# Patient Record
Sex: Female | Born: 1940
Health system: Southern US, Community
[De-identification: ages and names within clinical notes are randomized; demographics above are authoritative.]

## PROBLEM LIST (undated history)

## (undated) DIAGNOSIS — D649 Anemia, unspecified: Secondary | ICD-10-CM

## (undated) DIAGNOSIS — F32A Depression, unspecified: Secondary | ICD-10-CM

## (undated) DIAGNOSIS — E785 Hyperlipidemia, unspecified: Secondary | ICD-10-CM

## (undated) DIAGNOSIS — F419 Anxiety disorder, unspecified: Secondary | ICD-10-CM

## (undated) DIAGNOSIS — I10 Essential (primary) hypertension: Secondary | ICD-10-CM

## (undated) DIAGNOSIS — K219 Gastro-esophageal reflux disease without esophagitis: Secondary | ICD-10-CM

## (undated) DIAGNOSIS — C4492 Squamous cell carcinoma of skin, unspecified: Secondary | ICD-10-CM

## (undated) DIAGNOSIS — I480 Paroxysmal atrial fibrillation: Secondary | ICD-10-CM

## (undated) DIAGNOSIS — C4491 Basal cell carcinoma of skin, unspecified: Secondary | ICD-10-CM

## (undated) DIAGNOSIS — I739 Peripheral vascular disease, unspecified: Secondary | ICD-10-CM

## (undated) DIAGNOSIS — M1990 Unspecified osteoarthritis, unspecified site: Secondary | ICD-10-CM

## (undated) DIAGNOSIS — J189 Pneumonia, unspecified organism: Secondary | ICD-10-CM

## (undated) DIAGNOSIS — G4700 Insomnia, unspecified: Secondary | ICD-10-CM

## (undated) HISTORY — DX: Hyperlipidemia, unspecified: E78.5

## (undated) HISTORY — PX: COLONOSCOPY: SHX174

## (undated) HISTORY — PX: THORACIC SPINE SURGERY: SHX802

## (undated) HISTORY — DX: Paroxysmal atrial fibrillation: I48.0

## (undated) HISTORY — PX: TUBAL LIGATION: SHX77

## (undated) HISTORY — DX: Insomnia, unspecified: G47.00

## (undated) HISTORY — DX: Essential (primary) hypertension: I10

---
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
## 1997-08-28 HISTORY — PX: THORACIC SPINE SURGERY: SHX802

## 1998-03-24 ENCOUNTER — Other Ambulatory Visit: Admission: RE | Admit: 1998-03-24 | Discharge: 1998-03-24 | Payer: Self-pay | Admitting: *Deleted

## 1999-03-11 ENCOUNTER — Other Ambulatory Visit: Admission: RE | Admit: 1999-03-11 | Discharge: 1999-03-11 | Payer: Self-pay | Admitting: *Deleted

## 1999-10-26 ENCOUNTER — Encounter: Payer: Self-pay | Admitting: Specialist

## 1999-10-26 ENCOUNTER — Ambulatory Visit (HOSPITAL_COMMUNITY): Admission: RE | Admit: 1999-10-26 | Discharge: 1999-10-27 | Payer: Self-pay | Admitting: Specialist

## 1999-10-26 ENCOUNTER — Encounter (INDEPENDENT_AMBULATORY_CARE_PROVIDER_SITE_OTHER): Payer: Self-pay | Admitting: Specialist

## 2000-01-30 ENCOUNTER — Other Ambulatory Visit: Admission: RE | Admit: 2000-01-30 | Discharge: 2000-01-30 | Payer: Self-pay | Admitting: *Deleted

## 2000-06-27 ENCOUNTER — Other Ambulatory Visit: Admission: RE | Admit: 2000-06-27 | Discharge: 2000-06-27 | Payer: Self-pay | Admitting: *Deleted

## 2001-02-19 DIAGNOSIS — C4492 Squamous cell carcinoma of skin, unspecified: Secondary | ICD-10-CM

## 2001-02-19 HISTORY — DX: Squamous cell carcinoma of skin, unspecified: C44.92

## 2001-03-18 ENCOUNTER — Other Ambulatory Visit: Admission: RE | Admit: 2001-03-18 | Discharge: 2001-03-18 | Payer: Self-pay | Admitting: *Deleted

## 2001-03-18 ENCOUNTER — Encounter (INDEPENDENT_AMBULATORY_CARE_PROVIDER_SITE_OTHER): Payer: Self-pay | Admitting: Specialist

## 2002-05-21 ENCOUNTER — Other Ambulatory Visit: Admission: RE | Admit: 2002-05-21 | Discharge: 2002-05-21 | Payer: Self-pay | Admitting: *Deleted

## 2002-11-30 ENCOUNTER — Encounter: Payer: Self-pay | Admitting: Endocrinology

## 2002-11-30 ENCOUNTER — Ambulatory Visit: Admission: RE | Admit: 2002-11-30 | Discharge: 2002-11-30 | Payer: Self-pay | Admitting: Endocrinology

## 2003-07-08 ENCOUNTER — Other Ambulatory Visit: Admission: RE | Admit: 2003-07-08 | Discharge: 2003-07-08 | Payer: Self-pay | Admitting: *Deleted

## 2004-07-13 ENCOUNTER — Other Ambulatory Visit: Admission: RE | Admit: 2004-07-13 | Discharge: 2004-07-13 | Payer: Self-pay | Admitting: *Deleted

## 2005-04-18 DIAGNOSIS — C4491 Basal cell carcinoma of skin, unspecified: Secondary | ICD-10-CM

## 2005-04-18 HISTORY — DX: Basal cell carcinoma of skin, unspecified: C44.91

## 2005-07-14 ENCOUNTER — Other Ambulatory Visit: Admission: RE | Admit: 2005-07-14 | Discharge: 2005-07-14 | Payer: Self-pay | Admitting: Obstetrics and Gynecology

## 2006-09-04 ENCOUNTER — Other Ambulatory Visit: Admission: RE | Admit: 2006-09-04 | Discharge: 2006-09-04 | Payer: Self-pay | Admitting: Obstetrics & Gynecology

## 2009-06-09 ENCOUNTER — Encounter: Payer: Self-pay | Admitting: Obstetrics and Gynecology

## 2009-06-09 ENCOUNTER — Ambulatory Visit: Payer: Self-pay | Admitting: Obstetrics and Gynecology

## 2011-01-13 NOTE — Op Note (Signed)
[facility]  Patient:    Jodi Cline, Jodi Cline                            MRN: [id number] Proc. Date: 10/26/99 Attending:  Javier Docker, M.D.                           Operative Report  PREOPERATIVE DIAGNOSIS:  Herniated nucleus pulposus, L4-5 left.  POSTOPERATIVE DIAGNOSIS:  Herniated nucleus pulposus, L4-5 left.  OPERATION PERFORMED:  Microdiskectomy, L4-5 left.  SURGEON:  Javier Docker, M.D.  ASSISTANT:  Kerrin Champagne, M.D.  ANESTHESIA:  General.  INDICATIONS FOR PROCEDURE:  The patient is a 70 year old female with refractory  left lower extremity radicular pain, weakness, numbness secondary to an extruded fragment in L4-5, migrating cephalad compressing the 4 root.  Operative intervention was indicated for decompression of the 4 root.  Risks and benefits  were discussed including bleeding, infection, injury to neurovascular structures, no change in symptoms, need for fusion in the future, recurrent herniation, etc.  DESCRIPTION OF PROCEDURE:  With the patient in supine position after induction f adequate general anesthesia, 1 g of Kefzol IV ____________ prophylaxis, the patient was placed prone on the [place] frame.  All bony prominences were well padded.  The lumbar region was prepped and draped in the usual sterile fashion.  Three 18-gauge spinal needles were utilized to localize the 4-5 disk space. This was confirmed with x-ray and incision was made from the spinous process 4 to 5.  Subcutaneous tissue was dissected.  Electrocautery was utilized to achieve hemostasis.  The dorsal lumbar fascia was identified and divided in line with the skin incision and paraspinous muscles elevated from the lamina of 4 and 5. McCullough retractor was placed.  Operating microscope was draped and brought into the surgical field.  High-speed bur was utilized to perform a hemilaminotomy. he wound was irrigated.  A 2 mm Kerrison was utilized to detach  the ligamentum flavum from the cephalad attachment at 4.  A neural patty was placed beneath the ligamentum flavum.  This was then removed.  The L4 and L5 nerve roots were identified and found to be compressed in the lateral recess.  The lateral recess was decompressed with a 2 mm Kerrison.  With the neural elements protected at all times, a neural probe was then utilized to probe for the migrated herniation and this was noted to be migrated cephalad compressing the 4 root.  This was mobilized and retrieved with a micropituitary decompressing the 4 root.  The 4 root was found to be erythematous and edematous but intact.  The disk space was then identified and there was herniated material noted here as well.  With the neural elements ell protected, the annulotomy was performed with a 15 blade and copious portion of isk material was removed from the disk space.  Next, the foramina of 4 and 5 were probed and found to be without evidence of neural compression.  Beneath the thecal sac, the axilla and shoulder of the root were examined with no elements of residual disk material.  The wound was copiously irrigated and the disk space was copiously irrigated.  Confirmatory radiograph obtained.  This was copiously again, evaluated and inspection revealed no evidence of active bleeding or CSF leakage.  The McCullough retractor was then removed.  Paraspinous muscles inspected with no evidence of active bleeding.  Dorsal lumbar fascia reapproximated  with #1 Vicryl interrupted figure-of-eight sutures.  Subcutaneous tissues were reapproximated ith 2-0 Vicryl sutures.  Skin was reapproximated with 4-0 ____________ subcuticular  PDS.  The wound was reinforced with Steri-Strips and sterile dressing applied. The patient was supine on the hospital bed, extubated without difficulty and transported to recovery in satisfactory condition.  The patient tolerated the procedure well without  complication. DD:  12/26/99 TD:  12/28/99 Job: [id number]

## 2011-01-31 ENCOUNTER — Other Ambulatory Visit: Payer: Self-pay | Admitting: Physician Assistant

## 2011-04-18 ENCOUNTER — Other Ambulatory Visit: Payer: Self-pay | Admitting: Physician Assistant

## 2012-06-18 ENCOUNTER — Other Ambulatory Visit: Payer: Self-pay | Admitting: Internal Medicine

## 2012-06-18 DIAGNOSIS — R42 Dizziness and giddiness: Secondary | ICD-10-CM

## 2012-06-22 ENCOUNTER — Ambulatory Visit
Admission: RE | Admit: 2012-06-22 | Discharge: 2012-06-22 | Disposition: A | Discharge status: Home / Self Care | Payer: Self-pay | Source: Ambulatory Visit | Attending: Internal Medicine | Admitting: Internal Medicine

## 2012-06-22 ENCOUNTER — Ambulatory Visit
Admission: RE | Admit: 2012-06-22 | Discharge: 2012-06-22 | Disposition: A | Discharge status: Home / Self Care | Payer: Medicare Other | Source: Ambulatory Visit | Attending: Internal Medicine | Admitting: Internal Medicine

## 2012-06-22 DIAGNOSIS — R42 Dizziness and giddiness: Secondary | ICD-10-CM

## 2013-04-23 ENCOUNTER — Other Ambulatory Visit: Payer: Self-pay | Admitting: Physician Assistant

## 2013-08-05 ENCOUNTER — Other Ambulatory Visit: Payer: Self-pay | Admitting: Physician Assistant

## 2013-08-05 DIAGNOSIS — C4491 Basal cell carcinoma of skin, unspecified: Secondary | ICD-10-CM

## 2013-08-05 HISTORY — DX: Basal cell carcinoma of skin, unspecified: C44.91

## 2013-09-18 ENCOUNTER — Other Ambulatory Visit: Payer: Self-pay | Admitting: Physician Assistant

## 2014-03-26 ENCOUNTER — Encounter (HOSPITAL_BASED_OUTPATIENT_CLINIC_OR_DEPARTMENT_OTHER): Payer: Medicare HMO | Attending: Internal Medicine

## 2014-03-26 ENCOUNTER — Encounter (HOSPITAL_BASED_OUTPATIENT_CLINIC_OR_DEPARTMENT_OTHER): Payer: Medicare Other

## 2014-03-26 DIAGNOSIS — S81809A Unspecified open wound, unspecified lower leg, initial encounter: Secondary | ICD-10-CM | POA: Diagnosis present

## 2014-03-26 DIAGNOSIS — X58XXXA Exposure to other specified factors, initial encounter: Secondary | ICD-10-CM | POA: Insufficient documentation

## 2014-03-26 NOTE — Progress Notes (Signed)
[facility]  NAMEASAL, TEAS NO.:  [id number]  MEDICAL RECORD NO.:  [id number]      DATE OF BIRTH:  Jul 19, 1941  PHYSICIAN:  Ricard Dillon, M.D.      VISIT DATE:                                  OFFICE VISIT   CHIEF COMPLAINT:  Review of wounds on her anterior bilateral lower legs.  Jodi Cline comes to [place] with a courtesy of Dr. Lavone Orn, at [facility].  I do not have too much more to add to the history here.  The patient was working in her yard on her roses, had a fall traumatizing her anterior lower legs on a planter.  There was skin tears.  She has seems to have used various topical issues here such as a warm salt solution for a period of time.  In any case, she was prescribed Keflex for infected wound.  She had an allergic reaction to the [facility], received Benadryl and was put on doxycycline.  She came to the attention of Dr. Laurann Montana who has sent her here.  PAST MEDICAL HISTORY:  Limited.  She only as hypertension, hyperlipidemia.  PAST SURGICAL HISTORY:  Tubal ligation, laparoscopic back surgery, and oral surgery.  CURRENT MEDICATIONS:  Include doxycycline 100 b.i.d., biotin 100 daily, multivitamin, hydrochlorothiazide 25 daily, benazepril 40 daily, atorvastatin 10 daily, loratadine 10 daily, and Ambien 10 at bedtime.  PHYSICAL EXAMINATION:  VITAL SIGNS:  On examination, temperature is 98.4, pulse 76, respirations 18, blood pressure 154/59.  VASCULAR ASSESSMENT:  We did not do ABIs due to pain issue, however her dorsalis pedis pulses easily palpable bilaterally, popliteal pulses are strong.  She does not have significant venous stasis.  WOUND EXAM:  Unfortunately, the patient has a fair amount of damage here.  On the right anterior lower extremity, there are 2 separate wounds, all of these in the same state with a blackened eschar covering the wound area.  Also on the right lower extremity, there is a  small hematoma.  On the left extremity anterior, there are also 2 wounds, one of them small and a superior linear wound again with the same eschar.  We attempted to anesthetize this with topical lidocaine only with marginal effect.  I did have fair amount of debridement on these, the patient tolerated this marginally.  With removal of the eschar, there is healthy-looking tissue underneath.  I think once this is properly debrided healing should proceed nicely.  IMPRESSION:  Traumatic wound with a blackened eschar covering most of the surface of these wounds.  We applied Santyl foam dressing and a Kerlix wrap.  We prescribed the Santyl and we will get the foam and Kerlix sent to the patient's home through [facility].  The reason that this blackened eschar has formed so quickly over the wound surface in 2 weeks is a bit unusual, I do not have a wonderful explanation for this.  There was no obvious infection surrounding this debridement.  I did not culture and did not alter the doxycycline, she has already been prescribed.  It does not look infected currently.  The patient can change the dressings 2 or 3 times a week.  We will see her back in the [place]  in a week's time.          ______________________________ Ricard Dillon, M.D.     MGR/MEDQ  D:  03/26/2014  T:  03/26/2014  Job:  [id number]

## 2014-03-31 ENCOUNTER — Encounter (HOSPITAL_BASED_OUTPATIENT_CLINIC_OR_DEPARTMENT_OTHER): Payer: Medicare HMO | Attending: General Surgery

## 2014-03-31 ENCOUNTER — Other Ambulatory Visit: Payer: Self-pay | Admitting: Physician Assistant

## 2014-03-31 DIAGNOSIS — X58XXXA Exposure to other specified factors, initial encounter: Secondary | ICD-10-CM | POA: Diagnosis not present

## 2014-03-31 DIAGNOSIS — S81809A Unspecified open wound, unspecified lower leg, initial encounter: Secondary | ICD-10-CM | POA: Diagnosis present

## 2014-04-01 ENCOUNTER — Encounter (HOSPITAL_BASED_OUTPATIENT_CLINIC_OR_DEPARTMENT_OTHER): Payer: Medicare Other

## 2014-04-07 DIAGNOSIS — S81809A Unspecified open wound, unspecified lower leg, initial encounter: Secondary | ICD-10-CM | POA: Diagnosis not present

## 2014-04-21 DIAGNOSIS — S81809A Unspecified open wound, unspecified lower leg, initial encounter: Secondary | ICD-10-CM | POA: Diagnosis not present

## 2014-05-05 ENCOUNTER — Encounter (HOSPITAL_BASED_OUTPATIENT_CLINIC_OR_DEPARTMENT_OTHER): Payer: Medicare HMO | Attending: General Surgery

## 2014-05-05 DIAGNOSIS — X58XXXA Exposure to other specified factors, initial encounter: Secondary | ICD-10-CM | POA: Insufficient documentation

## 2014-05-05 DIAGNOSIS — S81809A Unspecified open wound, unspecified lower leg, initial encounter: Secondary | ICD-10-CM | POA: Insufficient documentation

## 2014-05-07 ENCOUNTER — Other Ambulatory Visit: Payer: Self-pay | Admitting: Physician Assistant

## 2014-08-04 ENCOUNTER — Other Ambulatory Visit: Payer: Self-pay | Admitting: Physician Assistant

## 2014-09-16 DIAGNOSIS — C44729 Squamous cell carcinoma of skin of left lower limb, including hip: Secondary | ICD-10-CM | POA: Diagnosis not present

## 2014-09-23 DIAGNOSIS — M542 Cervicalgia: Secondary | ICD-10-CM | POA: Diagnosis not present

## 2014-09-23 DIAGNOSIS — G4700 Insomnia, unspecified: Secondary | ICD-10-CM | POA: Diagnosis not present

## 2014-09-23 DIAGNOSIS — I10 Essential (primary) hypertension: Secondary | ICD-10-CM | POA: Diagnosis not present

## 2014-09-23 DIAGNOSIS — N3281 Overactive bladder: Secondary | ICD-10-CM | POA: Diagnosis not present

## 2014-10-20 DIAGNOSIS — L309 Dermatitis, unspecified: Secondary | ICD-10-CM | POA: Diagnosis not present

## 2014-10-20 DIAGNOSIS — L910 Hypertrophic scar: Secondary | ICD-10-CM | POA: Diagnosis not present

## 2015-02-18 DIAGNOSIS — Z1231 Encounter for screening mammogram for malignant neoplasm of breast: Secondary | ICD-10-CM | POA: Diagnosis not present

## 2015-04-07 DIAGNOSIS — M5412 Radiculopathy, cervical region: Secondary | ICD-10-CM | POA: Diagnosis not present

## 2015-04-07 DIAGNOSIS — E780 Pure hypercholesterolemia: Secondary | ICD-10-CM | POA: Diagnosis not present

## 2015-04-07 DIAGNOSIS — I10 Essential (primary) hypertension: Secondary | ICD-10-CM | POA: Diagnosis not present

## 2015-04-07 DIAGNOSIS — D692 Other nonthrombocytopenic purpura: Secondary | ICD-10-CM | POA: Diagnosis not present

## 2015-04-20 DIAGNOSIS — S80819A Abrasion, unspecified lower leg, initial encounter: Secondary | ICD-10-CM | POA: Diagnosis not present

## 2015-05-26 ENCOUNTER — Other Ambulatory Visit: Payer: Self-pay | Admitting: Physician Assistant

## 2015-05-26 DIAGNOSIS — L570 Actinic keratosis: Secondary | ICD-10-CM | POA: Diagnosis not present

## 2015-05-26 DIAGNOSIS — H5203 Hypermetropia, bilateral: Secondary | ICD-10-CM | POA: Diagnosis not present

## 2015-05-26 DIAGNOSIS — Z961 Presence of intraocular lens: Secondary | ICD-10-CM | POA: Diagnosis not present

## 2015-05-26 DIAGNOSIS — D485 Neoplasm of uncertain behavior of skin: Secondary | ICD-10-CM | POA: Diagnosis not present

## 2015-06-16 DIAGNOSIS — L570 Actinic keratosis: Secondary | ICD-10-CM | POA: Diagnosis not present

## 2015-07-13 ENCOUNTER — Other Ambulatory Visit: Payer: Self-pay | Admitting: Physician Assistant

## 2015-07-13 DIAGNOSIS — L859 Epidermal thickening, unspecified: Secondary | ICD-10-CM | POA: Diagnosis not present

## 2015-07-13 DIAGNOSIS — D485 Neoplasm of uncertain behavior of skin: Secondary | ICD-10-CM | POA: Diagnosis not present

## 2015-07-13 DIAGNOSIS — C44712 Basal cell carcinoma of skin of right lower limb, including hip: Secondary | ICD-10-CM | POA: Diagnosis not present

## 2015-07-13 DIAGNOSIS — C44719 Basal cell carcinoma of skin of left lower limb, including hip: Secondary | ICD-10-CM | POA: Diagnosis not present

## 2015-07-13 DIAGNOSIS — C4491 Basal cell carcinoma of skin, unspecified: Secondary | ICD-10-CM

## 2015-07-13 HISTORY — DX: Basal cell carcinoma of skin, unspecified: C44.91

## 2015-09-14 DIAGNOSIS — L910 Hypertrophic scar: Secondary | ICD-10-CM | POA: Diagnosis not present

## 2015-09-14 DIAGNOSIS — L570 Actinic keratosis: Secondary | ICD-10-CM | POA: Diagnosis not present

## 2015-10-11 DIAGNOSIS — I10 Essential (primary) hypertension: Secondary | ICD-10-CM | POA: Diagnosis not present

## 2015-10-11 DIAGNOSIS — N3281 Overactive bladder: Secondary | ICD-10-CM | POA: Diagnosis not present

## 2015-10-11 DIAGNOSIS — M5412 Radiculopathy, cervical region: Secondary | ICD-10-CM | POA: Diagnosis not present

## 2015-10-27 DIAGNOSIS — M79662 Pain in left lower leg: Secondary | ICD-10-CM | POA: Diagnosis not present

## 2015-10-27 DIAGNOSIS — K529 Noninfective gastroenteritis and colitis, unspecified: Secondary | ICD-10-CM | POA: Diagnosis not present

## 2015-11-09 DIAGNOSIS — L309 Dermatitis, unspecified: Secondary | ICD-10-CM | POA: Diagnosis not present

## 2015-12-13 DIAGNOSIS — R300 Dysuria: Secondary | ICD-10-CM | POA: Diagnosis not present

## 2016-01-26 ENCOUNTER — Other Ambulatory Visit: Payer: Self-pay | Admitting: Family Medicine

## 2016-01-26 DIAGNOSIS — M5412 Radiculopathy, cervical region: Secondary | ICD-10-CM

## 2016-01-28 ENCOUNTER — Other Ambulatory Visit: Payer: Self-pay | Admitting: Internal Medicine

## 2016-01-28 DIAGNOSIS — M5412 Radiculopathy, cervical region: Secondary | ICD-10-CM

## 2016-01-29 ENCOUNTER — Ambulatory Visit
Admission: RE | Admit: 2016-01-29 | Discharge: 2016-01-29 | Disposition: A | Discharge status: Home / Self Care | Payer: Commercial Managed Care - HMO | Source: Ambulatory Visit | Attending: Family Medicine | Admitting: Family Medicine

## 2016-01-29 DIAGNOSIS — M50322 Other cervical disc degeneration at C5-C6 level: Secondary | ICD-10-CM | POA: Diagnosis not present

## 2016-01-29 DIAGNOSIS — M5412 Radiculopathy, cervical region: Secondary | ICD-10-CM

## 2016-01-29 DIAGNOSIS — M50323 Other cervical disc degeneration at C6-C7 level: Secondary | ICD-10-CM | POA: Diagnosis not present

## 2016-02-23 DIAGNOSIS — Z803 Family history of malignant neoplasm of breast: Secondary | ICD-10-CM | POA: Diagnosis not present

## 2016-02-23 DIAGNOSIS — Z1231 Encounter for screening mammogram for malignant neoplasm of breast: Secondary | ICD-10-CM | POA: Diagnosis not present

## 2016-03-21 DIAGNOSIS — M5412 Radiculopathy, cervical region: Secondary | ICD-10-CM | POA: Diagnosis not present

## 2016-03-21 DIAGNOSIS — M5030 Other cervical disc degeneration, unspecified cervical region: Secondary | ICD-10-CM | POA: Diagnosis not present

## 2016-03-24 DIAGNOSIS — M5412 Radiculopathy, cervical region: Secondary | ICD-10-CM | POA: Diagnosis not present

## 2016-03-24 DIAGNOSIS — M5030 Other cervical disc degeneration, unspecified cervical region: Secondary | ICD-10-CM | POA: Diagnosis not present

## 2016-03-31 DIAGNOSIS — M5030 Other cervical disc degeneration, unspecified cervical region: Secondary | ICD-10-CM | POA: Diagnosis not present

## 2016-03-31 DIAGNOSIS — M5412 Radiculopathy, cervical region: Secondary | ICD-10-CM | POA: Diagnosis not present

## 2016-04-07 DIAGNOSIS — M5412 Radiculopathy, cervical region: Secondary | ICD-10-CM | POA: Diagnosis not present

## 2016-04-07 DIAGNOSIS — M5030 Other cervical disc degeneration, unspecified cervical region: Secondary | ICD-10-CM | POA: Diagnosis not present

## 2016-04-10 DIAGNOSIS — Z1389 Encounter for screening for other disorder: Secondary | ICD-10-CM | POA: Diagnosis not present

## 2016-04-10 DIAGNOSIS — Z6825 Body mass index (BMI) 25.0-25.9, adult: Secondary | ICD-10-CM | POA: Diagnosis not present

## 2016-04-10 DIAGNOSIS — Z0000 Encounter for general adult medical examination without abnormal findings: Secondary | ICD-10-CM | POA: Diagnosis not present

## 2016-04-10 DIAGNOSIS — I10 Essential (primary) hypertension: Secondary | ICD-10-CM | POA: Diagnosis not present

## 2016-04-10 DIAGNOSIS — M5030 Other cervical disc degeneration, unspecified cervical region: Secondary | ICD-10-CM | POA: Diagnosis not present

## 2016-04-10 DIAGNOSIS — E7800 Pure hypercholesterolemia, unspecified: Secondary | ICD-10-CM | POA: Diagnosis not present

## 2016-04-14 DIAGNOSIS — M5030 Other cervical disc degeneration, unspecified cervical region: Secondary | ICD-10-CM | POA: Diagnosis not present

## 2016-04-14 DIAGNOSIS — M5412 Radiculopathy, cervical region: Secondary | ICD-10-CM | POA: Diagnosis not present

## 2016-04-26 ENCOUNTER — Other Ambulatory Visit: Payer: Self-pay | Admitting: Physician Assistant

## 2016-04-26 DIAGNOSIS — L719 Rosacea, unspecified: Secondary | ICD-10-CM | POA: Diagnosis not present

## 2016-04-26 DIAGNOSIS — L570 Actinic keratosis: Secondary | ICD-10-CM | POA: Diagnosis not present

## 2016-04-26 DIAGNOSIS — D485 Neoplasm of uncertain behavior of skin: Secondary | ICD-10-CM | POA: Diagnosis not present

## 2016-05-25 DIAGNOSIS — L570 Actinic keratosis: Secondary | ICD-10-CM | POA: Diagnosis not present

## 2016-05-25 DIAGNOSIS — L309 Dermatitis, unspecified: Secondary | ICD-10-CM | POA: Diagnosis not present

## 2016-08-02 DIAGNOSIS — Z961 Presence of intraocular lens: Secondary | ICD-10-CM | POA: Diagnosis not present

## 2016-08-02 DIAGNOSIS — H02831 Dermatochalasis of right upper eyelid: Secondary | ICD-10-CM | POA: Diagnosis not present

## 2016-08-02 DIAGNOSIS — H5203 Hypermetropia, bilateral: Secondary | ICD-10-CM | POA: Diagnosis not present

## 2016-08-02 DIAGNOSIS — H02834 Dermatochalasis of left upper eyelid: Secondary | ICD-10-CM | POA: Diagnosis not present

## 2016-08-16 DIAGNOSIS — L570 Actinic keratosis: Secondary | ICD-10-CM | POA: Diagnosis not present

## 2016-08-16 DIAGNOSIS — L720 Epidermal cyst: Secondary | ICD-10-CM | POA: Diagnosis not present

## 2016-10-12 ENCOUNTER — Other Ambulatory Visit: Payer: Self-pay | Admitting: Physician Assistant

## 2016-10-12 DIAGNOSIS — L720 Epidermal cyst: Secondary | ICD-10-CM | POA: Diagnosis not present

## 2016-10-13 DIAGNOSIS — I10 Essential (primary) hypertension: Secondary | ICD-10-CM | POA: Diagnosis not present

## 2016-10-13 DIAGNOSIS — E7800 Pure hypercholesterolemia, unspecified: Secondary | ICD-10-CM | POA: Diagnosis not present

## 2016-10-25 DIAGNOSIS — T783XXA Angioneurotic edema, initial encounter: Secondary | ICD-10-CM | POA: Diagnosis not present

## 2016-10-25 DIAGNOSIS — T464X1A Poisoning by angiotensin-converting-enzyme inhibitors, accidental (unintentional), initial encounter: Secondary | ICD-10-CM | POA: Diagnosis not present

## 2016-11-13 DIAGNOSIS — I10 Essential (primary) hypertension: Secondary | ICD-10-CM | POA: Diagnosis not present

## 2016-12-28 DIAGNOSIS — I10 Essential (primary) hypertension: Secondary | ICD-10-CM | POA: Diagnosis not present

## 2017-03-23 DIAGNOSIS — Z1231 Encounter for screening mammogram for malignant neoplasm of breast: Secondary | ICD-10-CM | POA: Diagnosis not present

## 2017-03-23 DIAGNOSIS — Z803 Family history of malignant neoplasm of breast: Secondary | ICD-10-CM | POA: Diagnosis not present

## 2017-03-23 DIAGNOSIS — M8589 Other specified disorders of bone density and structure, multiple sites: Secondary | ICD-10-CM | POA: Diagnosis not present

## 2017-03-23 DIAGNOSIS — Z780 Asymptomatic menopausal state: Secondary | ICD-10-CM | POA: Diagnosis not present

## 2017-04-17 ENCOUNTER — Other Ambulatory Visit: Payer: Self-pay | Admitting: Internal Medicine

## 2017-04-17 ENCOUNTER — Ambulatory Visit
Admission: RE | Admit: 2017-04-17 | Discharge: 2017-04-17 | Disposition: A | Discharge status: Home / Self Care | Payer: Commercial Managed Care - HMO | Source: Ambulatory Visit | Attending: Internal Medicine | Admitting: Internal Medicine

## 2017-04-17 DIAGNOSIS — M79671 Pain in right foot: Secondary | ICD-10-CM

## 2017-04-17 DIAGNOSIS — Z0000 Encounter for general adult medical examination without abnormal findings: Secondary | ICD-10-CM | POA: Diagnosis not present

## 2017-04-17 DIAGNOSIS — Z1389 Encounter for screening for other disorder: Secondary | ICD-10-CM | POA: Diagnosis not present

## 2017-04-17 DIAGNOSIS — M25571 Pain in right ankle and joints of right foot: Secondary | ICD-10-CM | POA: Diagnosis not present

## 2017-04-17 DIAGNOSIS — M79672 Pain in left foot: Secondary | ICD-10-CM | POA: Diagnosis not present

## 2017-04-17 DIAGNOSIS — E7800 Pure hypercholesterolemia, unspecified: Secondary | ICD-10-CM | POA: Diagnosis not present

## 2017-04-17 DIAGNOSIS — E538 Deficiency of other specified B group vitamins: Secondary | ICD-10-CM | POA: Diagnosis not present

## 2017-04-17 DIAGNOSIS — M19071 Primary osteoarthritis, right ankle and foot: Secondary | ICD-10-CM | POA: Diagnosis not present

## 2017-04-17 DIAGNOSIS — I10 Essential (primary) hypertension: Secondary | ICD-10-CM | POA: Diagnosis not present

## 2017-04-17 DIAGNOSIS — F5104 Psychophysiologic insomnia: Secondary | ICD-10-CM | POA: Diagnosis not present

## 2017-06-20 DIAGNOSIS — L219 Seborrheic dermatitis, unspecified: Secondary | ICD-10-CM | POA: Diagnosis not present

## 2017-06-20 DIAGNOSIS — L299 Pruritus, unspecified: Secondary | ICD-10-CM | POA: Diagnosis not present

## 2017-06-20 DIAGNOSIS — Z23 Encounter for immunization: Secondary | ICD-10-CM | POA: Diagnosis not present

## 2017-06-20 DIAGNOSIS — D229 Melanocytic nevi, unspecified: Secondary | ICD-10-CM | POA: Diagnosis not present

## 2017-07-10 DIAGNOSIS — Z961 Presence of intraocular lens: Secondary | ICD-10-CM | POA: Diagnosis not present

## 2017-07-10 DIAGNOSIS — H5203 Hypermetropia, bilateral: Secondary | ICD-10-CM | POA: Diagnosis not present

## 2017-08-08 ENCOUNTER — Ambulatory Visit: Payer: Commercial Managed Care - HMO | Admitting: Sports Medicine

## 2017-08-08 ENCOUNTER — Encounter: Payer: Self-pay | Admitting: Sports Medicine

## 2017-08-08 ENCOUNTER — Ambulatory Visit: Payer: Self-pay

## 2017-08-08 VITALS — BP 110/70 | HR 57 | Ht 66.75 in | Wt 159.8 lb

## 2017-08-08 DIAGNOSIS — M79672 Pain in left foot: Secondary | ICD-10-CM | POA: Diagnosis not present

## 2017-08-08 DIAGNOSIS — M7752 Other enthesopathy of left foot: Secondary | ICD-10-CM | POA: Diagnosis not present

## 2017-08-08 DIAGNOSIS — M24573 Contracture, unspecified ankle: Secondary | ICD-10-CM

## 2017-08-08 DIAGNOSIS — M722 Plantar fascial fibromatosis: Secondary | ICD-10-CM

## 2017-08-08 MED ORDER — CELECOXIB 100 MG PO CAPS: ORAL_CAPSULE | ORAL | 2 refills | Status: DC

## 2017-08-08 NOTE — Progress Notes (Signed)
Jodi Cline. Jodi Cline, [place] at [place] - 76 y.o. female MRN [id number]  Date of birth: [birth year]   Scribe for today's visit: Josepha Pigg, CMA    SUBJECTIVE:  Jodi Cline is here for New Patient (Initial Visit) (LT foot pain)  Her LT foot/ankle/heel pain symptoms INITIALLY: Began about 1 year ago and she has hx of plantar fasciitis. This is a little different, feels like it is more in the ankle this time.  Described as a varying degree of pain depending on how she walks. Pain ranges from mild to severe at times.  Pain is described as sharp and it comes and goes. Pain radiating to the back of the LT leg Worsened with walking. Sometimes the aching is worse after sitting for prolonged periods of time. Pain is also worse when first waking up in the morning or trying to stretch the back of her leg.  Improved with rest Additional associated symptoms include: She has noticed a "bluish" tint near the achilles. She has occasional mild swelling around the ankle.     At this time symptoms show no change compared to onset. She has gone to PT, had injections in the past, and inserts for her shoes.  She does occasionally take Naprosyn for the pain with some relief.    ROS Denies night time disturbances. Denies fevers, chills, or night sweats. Denies unexplained weight loss. Denies personal history of cancer. Denies changes in bowel or bladder habits. Denies recent unreported falls. Denies new or worsening dyspnea or wheezing. Denies headaches or dizziness.  Reports numbness, tingling or weakness  In the extremities.  Denies dizziness or presyncopal episodes Reports lower extremity edema     HISTORY & PERTINENT PRIOR DATA:  Prior History reviewed and updated per electronic medical record.  Significant history, findings, studies and interim changes include:  reports that she has quit smoking. she has never used  smokeless tobacco. No results for input(s): HGBA1C, LABURIC, CREATINE in the last 8760 hours. No specialty comments available. No problems updated.   OBJECTIVE:  VS:  HT:5' 6.75" (169.5 cm)   WT:159 lb 12.8 oz (72.5 kg)  BMI:25.23    BP:110/70  HR:(!) 57bpm  TEMP: ( )  RESP:95 %  PHYSICAL EXAM: Constitutional: WDWN, Non-toxic appearing. Psychiatric: Alert & appropriately interactive. Not depressed or anxious appearing. Respiratory: No increased work of breathing. Trachea Midline Eyes: Pupils are equal. EOM intact without nystagmus. No scleral icterus Cardiovascular:  Peripheral Pulses: peripheral pulses symmetrical No clubbing or cyanosis appreciated Capillary Refill is normal, less than 2 seconds No signficant generalized edema/anasarca Sensory Exam: intact to light touch   Left Foot & Ankle Exam: No significant rashes/lesions/ulcerations overlying the examined area No overlying erythema/ecchymosis.   General Alignment: Normal alignment & Contours Long Arch: Moderate, collapses with weight bearing Hind Foot Alignment: Normal Posterior Tibialis Recruitment: normal Transverse Arch: abnormal: As below Splay Toe present: Yes, bilateral Hammer Toes present: Yes, bilateral Bunion Present: No Bunionette Present: No Morton's Callus: Yes, left Palpation:   Navicular: nontender Base of the 5th metatarsal: nontender Posterior aspect of MEDIAL malleolus: nontender Posterior aspect of LATERAL mallelous: nontender Moderate pain with palpation of the posterior calcaneus.  No pain along the avascular zone of the Achilles, no nodule.  ROM: Equinus contracture to: 95 Strength: No significant weakness with Inversion, eversion, dorsiflexion and plantar flexion Ankle Stablity: stable to testing and No significant pain with midfoot abduction   ASSESSMENT &  PLAN:   1. Left foot pain   2. Retrocalcaneal bursitis (back of heel), left   3. Plantar fasciitis of left foot   4. Equinus  contracture of ankle    PLAN: Symptoms are consistent with plantar fasciitis and retrocalcaneal bursitis.  We will have her begin on anti-inflammatories and therapeutic exercises per procedure note.  If any lack of improvement can consider retrocalcaneal injection and custom orthotics and this was discussed with her today. Longitudinal arch support over-the-counter as well as arch compression discussed.  ++++++++++++++++++++++++++++++++++++++++++++ Orders & Meds: Orders Placed This Encounter  Procedures  . Misc procedure  . [place] LIMITED JOINT SPACE STRUCTURES LOW LEFT(NO LINKED CHARGES)    Meds ordered this encounter  Medications  . celecoxib (CELEBREX) 100 MG capsule    Sig: 2 tablets p.o. twice daily times 14 days then 2 tablets p.o. twice daily as needed    Dispense:  60 capsule    Refill:  2    ++++++++++++++++++++++++++++++++++++++++++++ Follow-up: Return in about 4 weeks (around 09/05/2017).   Pertinent documentation may be included in additional procedure notes, imaging studies, problem based documentation and patient instructions. Please see these sections of the encounter for additional information regarding this visit. CMA/ATC served as Education administrator during this visit. History, Physical, and Plan performed by medical provider. Documentation and orders reviewed and attested to.      [name], [place] [facility] Physician

## 2017-08-08 NOTE — Patient Instructions (Signed)
Please perform the exercise program that we have prepared for you and gone over in detail on a daily basis.  In addition to the handout you were provided you can access your program through: [email or web address]   Your unique program code is:  [email or web address]   Look into having your insurance company cover a set of custom orthotics.  The code is L3030 and there are 2 units.  You can call them  and ask if this is covered.  I am happy to do these for you at any time, you just need to let our front office schedulers know you would like an "orthotic appointment."

## 2017-08-08 NOTE — Procedures (Signed)

## 2017-08-08 NOTE — Procedures (Signed)
LIMITED MSK ULTRASOUND OF left foot and ankle Images were obtained and interpreted by myself, Teresa Coombs, DO  Images have been saved and stored to PACS system. Images obtained on: GE S7 Ultrasound machine  FINDINGS:   Left Achilles is overall normal in appearance within the avascular zone and proximal tendon.  There is a small amount of retrocalcaneal bursitis appreciated at the distal insertion.  Slight fragmentation consistent with Haglund's deformity of the calcaneus.  Plantar spur is present with thickening of the plantar fascia measuring 0.49cm with longitudinal arch strain measuring 0.24 cm at 4 cm distal to the calcaneus  IMPRESSION:  1. Long arch strain 2. Plantar fasciitis 3. Haglund's deformity 4. Retrocalcaneal bursitis

## 2017-08-09 ENCOUNTER — Telehealth: Payer: Self-pay | Admitting: Sports Medicine

## 2017-08-09 ENCOUNTER — Other Ambulatory Visit: Payer: Self-pay

## 2017-08-09 MED ORDER — CELECOXIB 100 MG PO CAPS: 100.0000 mg | ORAL_CAPSULE | Freq: Two times a day (BID) | ORAL | 2 refills | Status: DC

## 2017-08-09 NOTE — Telephone Encounter (Addendum)
Copied from [place] #[id number]. Topic: Quick Communication - See Telephone Encounter >> Aug 09, 2017  9:22 AM Ivar Drape wrote: CRM for notification. See Telephone encounter for:  08/09/17. Patient stated that Dr. Paulla Fore prescribed her to take the generic celecoxib (CELEBREX) 100 MG capsule 4 times a day, but [employer] will only allow her to take it twice a day.  So the [place] [facility] [place] suggested increasing the dosage from 100mg  to 200mg  to be taken twice a day.  Please advise.

## 2017-08-09 NOTE — Telephone Encounter (Signed)
Forwarding to Dr. Paulla Fore to advise if OK to make med change.

## 2017-08-09 NOTE — Telephone Encounter (Signed)
Prescription was written for twice per day initially.  Should not be taken more often than twice per day.

## 2017-08-09 NOTE — Telephone Encounter (Signed)
Called pt and advised that rx is supposed to be for Celebrex 100 mg to take 1 capsule po BID. Also advised pt that new rx has been sent to her pharmacy. Pt verbalized understanding.

## 2017-09-05 ENCOUNTER — Encounter: Payer: Self-pay | Admitting: Sports Medicine

## 2017-09-05 ENCOUNTER — Ambulatory Visit (INDEPENDENT_AMBULATORY_CARE_PROVIDER_SITE_OTHER): Payer: Medicare HMO | Admitting: Sports Medicine

## 2017-09-05 VITALS — BP 132/72 | HR 74 | Ht 66.75 in | Wt 160.6 lb

## 2017-09-05 DIAGNOSIS — M24573 Contracture, unspecified ankle: Secondary | ICD-10-CM

## 2017-09-05 DIAGNOSIS — M7752 Other enthesopathy of left foot: Secondary | ICD-10-CM | POA: Diagnosis not present

## 2017-09-05 DIAGNOSIS — M79672 Pain in left foot: Secondary | ICD-10-CM | POA: Diagnosis not present

## 2017-09-05 DIAGNOSIS — M722 Plantar fascial fibromatosis: Secondary | ICD-10-CM

## 2017-09-05 NOTE — Progress Notes (Signed)
Jodi Cline. Jodi Cline, [place] at [place] - 77 y.o. female MRN [id number]  Date of birth: 10/06/1940  Visit Date: 09/05/2017  PCP: Lavone Orn, MD   Referred by: Lavone Orn, MD   Scribe for today's visit: Josepha Pigg, CMA     SUBJECTIVE:  Jodi Cline is here for Follow-up (LT foot pain)  Compared to the last office visit, her previously described symptoms are improving  Current symptoms are mild-moderate & are nonradiating She has tried shoe inserts in the past, received steroid injection in the past, has been to PT. She has tried taking Naproxen but was prescribed Celebrex at her last visit, she does get relief with this. She was also provided with home therapeutic exercises. She did purchase arch support but she hasn't been using them daily.    ROS Denies night time disturbances. Denies fevers, chills, or night sweats. Denies unexplained weight loss. Denies personal history of cancer. Denies changes in bowel or bladder habits. Denies recent unreported falls. Denies new or worsening dyspnea or wheezing. Denies headaches or dizziness.  Reports numbness, tingling or weakness  In the extremities.  Denies dizziness or presyncopal episodes Reports lower extremity edema later in the day, LT ankle    HISTORY & PERTINENT PRIOR DATA:  Prior History reviewed and updated per electronic medical record.  Significant history, findings, studies and interim changes include:  reports that she has quit smoking. she has never used smokeless tobacco. No results for input(s): HGBA1C, LABURIC, CREATINE in the last 8760 hours. No specialty comments available. No problems updated.  OBJECTIVE:  VS:  HT:5' 6.75" (169.5 cm)   WT:160 lb 9.6 oz (72.8 kg)  BMI:25.36    BP:132/72  HR:74bpm  TEMP: ( )  RESP:95 %   PHYSICAL EXAM: Constitutional: WDWN, Non-toxic appearing. Psychiatric: Alert & appropriately  interactive. Not depressed or anxious appearing. Respiratory: No increased work of breathing. Trachea Midline Eyes: Pupils are equal. EOM intact without nystagmus. No scleral icterus Cardiovascular:  Peripheral Pulses: peripheral pulses symmetrical No clubbing or cyanosis appreciated Capillary Refill is normal, less than 2 seconds No signficant generalized edema/anasarca Sensory Exam: intact to light touch  Left foot: Pes planus with subluxation of the midfoot.  She has marked pain with palpation of the longitudinal arch.  Slight equinus contracture this is improved with dorsiflexion to 95 degrees.  Ankle is stable to ligamentous testing.  She has minimal pain with palpation of the Achilles insertion which is markedly improved than last office visit.  No persistent bogginess.  No additional findings.   ASSESSMENT & PLAN:   1. Left foot pain   2. Retrocalcaneal bursitis (back of heel), left   3. Plantar fasciitis of left foot   4. Equinus contracture of ankle    PLAN: Overall continues to improve.  Much less pain in the past.  Like to have her try to wean off the Celebrex and continue with therapeutic exercises and instructions per AVS.  If any lack of improvement can consider retrocalcaneal injection.  Plan to repeat ultrasound at 8-week follow-up  ++++++++++++++++++++++++++++++++++++++++++++ Follow-up: Return in about 8 weeks (around 10/31/2017).   Pertinent documentation may be included in additional procedure notes, imaging studies, problem based documentation and patient instructions. Please see these sections of the encounter for additional information regarding this visit. CMA/ATC served as Education administrator during this visit. History, Physical, and Plan performed by medical provider. Documentation and orders reviewed and attested to.  Rachele Lamaster D Tanveer Brammer, DO    [place] [facility] Physician   

## 2017-09-05 NOTE — Patient Instructions (Signed)
Keep doing her exercises on a daily basis Try soaking her foot in a bucket of cool water for 10 minutes.  This does not need to be miserably cold. Try cutting back on her Celebrex to taking it just as needed.  I hope that you are able to cut back to using it may be 1-2 times per week.

## 2017-09-27 ENCOUNTER — Encounter: Payer: Self-pay | Admitting: Sports Medicine

## 2017-09-27 ENCOUNTER — Telehealth: Payer: Self-pay | Admitting: Sports Medicine

## 2017-09-27 ENCOUNTER — Ambulatory Visit (INDEPENDENT_AMBULATORY_CARE_PROVIDER_SITE_OTHER): Payer: Medicare HMO

## 2017-09-27 ENCOUNTER — Ambulatory Visit: Payer: Medicare HMO | Admitting: Sports Medicine

## 2017-09-27 VITALS — BP 116/72 | HR 72 | Ht 66.75 in | Wt 161.4 lb

## 2017-09-27 DIAGNOSIS — M79672 Pain in left foot: Secondary | ICD-10-CM | POA: Diagnosis not present

## 2017-09-27 DIAGNOSIS — M25552 Pain in left hip: Secondary | ICD-10-CM

## 2017-09-27 DIAGNOSIS — G4700 Insomnia, unspecified: Secondary | ICD-10-CM | POA: Insufficient documentation

## 2017-09-27 DIAGNOSIS — R319 Hematuria, unspecified: Secondary | ICD-10-CM | POA: Insufficient documentation

## 2017-09-27 DIAGNOSIS — M7752 Other enthesopathy of left foot: Secondary | ICD-10-CM | POA: Diagnosis not present

## 2017-09-27 DIAGNOSIS — M545 Low back pain: Secondary | ICD-10-CM | POA: Diagnosis not present

## 2017-09-27 DIAGNOSIS — K5902 Outlet dysfunction constipation: Secondary | ICD-10-CM | POA: Insufficient documentation

## 2017-09-27 DIAGNOSIS — M4726 Other spondylosis with radiculopathy, lumbar region: Secondary | ICD-10-CM | POA: Diagnosis not present

## 2017-09-27 DIAGNOSIS — M5410 Radiculopathy, site unspecified: Secondary | ICD-10-CM | POA: Insufficient documentation

## 2017-09-27 DIAGNOSIS — E7800 Pure hypercholesterolemia, unspecified: Secondary | ICD-10-CM | POA: Insufficient documentation

## 2017-09-27 DIAGNOSIS — D126 Benign neoplasm of colon, unspecified: Secondary | ICD-10-CM | POA: Insufficient documentation

## 2017-09-27 DIAGNOSIS — E785 Hyperlipidemia, unspecified: Secondary | ICD-10-CM | POA: Insufficient documentation

## 2017-09-27 DIAGNOSIS — I10 Essential (primary) hypertension: Secondary | ICD-10-CM | POA: Insufficient documentation

## 2017-09-27 MED ORDER — METHYLPREDNISOLONE 4 MG PO TBPK: ORAL_TABLET | ORAL | 0 refills | Status: DC

## 2017-09-27 MED ORDER — GABAPENTIN 100 MG PO CAPS: ORAL_CAPSULE | ORAL | 1 refills | Status: DC

## 2017-09-27 NOTE — Patient Instructions (Addendum)
I believe your back is flaring up and this is what is causing her left leg and foot pain.  If you are not had any having any good improvement in the next several weeks please let me know.  We will be in touch with you about your x-rays.

## 2017-09-27 NOTE — Progress Notes (Signed)
Jodi Cline. Jodi Cline, [place] [facility] [place] - 77 y.o. female MRN [id number]  Date of birth: [birth year]  Visit Date: 09/27/2017  PCP: Lavone Orn, MD   Referred by: Lavone Orn, MD   Scribe for today's visit: Wendy Poet, LAT, ATC     SUBJECTIVE:  Jodi Cline is here for Initial Assessment (L hip pain) .   Her L hip pain symptoms INITIALLY: Began 2-3 days ago w/ no MOI Described as aching pain 4/10  , radiating to the L LE Worsened with nothing stated Improved with nothing noted Additional associated symptoms include: no N/T noted in the L LE and no popping or clicking noted in the joints of the L LE    At this time symptoms are worsening compared to onset w/ increased frequency of pain She has been using Celebrex which hasn't helped.  She has not tried ice or heat.  L quad and glute pain   ROS Denies night time disturbances. Denies fevers, chills, or night sweats. Denies unexplained weight loss. Denies personal history of cancer. Denies changes in bowel or bladder habits. Denies recent unreported falls. Denies new or worsening dyspnea or wheezing. Denies headaches or dizziness.  Denies numbness, tingling or weakness  In the extremities.  Denies dizziness or presyncopal episodes Reports lower extremity edema  In the L ankle.   HISTORY & PERTINENT PRIOR DATA:  Prior History reviewed and updated per electronic medical record.  Significant history, findings, studies and interim changes include:  reports that she has quit smoking. she has never used smokeless tobacco. No results for input(s): HGBA1C, LABURIC, CREATINE in the last 8760 hours. No specialty comments available. 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 Psychiatric: Alert & appropriately interactive.  Not depressed or anxious appearing. Respiratory: No increased work of breathing.  Trachea Midline Eyes: Pupils are equal.  EOM intact without nystagmus.  No scleral icterus  NEUROVASCULAR exam: No clubbing or cyanosis appreciated No significant venous stasis changes Capillary Refill: normal, less than 2 seconds   Bilateral lower extremities overall well aligned.  She does have senile purpuric changes but no significant venous stasis changes.  No significant lower extremity edema.  She has pain with straight leg raise localizing to the buttock.  Pain with popliteal compression test.  Her medial ankle pain is improved on the left compared in the past but she still has some discomfort with posterior tibialis palpation.  Dorsiflexion and plantarflexion strength is good however she is slightly weaker on the left with dorsiflexion than on the right.  She is able to heel and toe walk without difficulty.  Mild pain over the greater trochanter but this is nonfocal moderate pain within the glute medius musculature.   No additional findings.   ASSESSMENT & PLAN:   1. Left hip pain   2. Left foot pain   3. Retrocalcaneal bursitis (back of heel), left   4. Osteoarthritis of spine with radiculopathy, lumbar region    PLAN:   See patient instructions.  Suspect this is coming from an underlying lumbar radiculitis that is  likely contributing to some of the retrocalcaneal bursitis that she has had previously.  We will start with Medrol Dosepak and low-dose gabapentin as well as continue with previously prescribed therapeutic exercises and increasing her physical activity slowly as tolerated.  No red flag symptoms.  Follow-up in 6 weeks as already scheduled.    We will plan to call with results of the x-ray No  problem-specific Assessment & Plan notes found for this encounter.   ++++++++++++++++++++++++++++++++++++++++++++ Orders & Meds: Orders Placed This Encounter  Procedures  . DG Lumbar Spine 2-3 Views    Meds ordered this encounter  Medications  . methylPREDNISolone (MEDROL DOSEPAK) 4 MG TBPK tablet    Sig: Take by mouth as directed. Take 6 tablets on the first day prescribed then as directed.    Dispense:  21 tablet    Refill:  0  . gabapentin (NEURONTIN) 100 MG capsule    Sig: Start with 1 tab po qhs X 1 week, then increase to 1 tab po bid    Dispense:  60 capsule    Refill:  1    ++++++++++++++++++++++++++++++++++++++++++++ Follow-up: Return in about 6 weeks (around 11/08/2017) for as already scheduled.   Pertinent documentation may be included in additional procedure notes, imaging studies, problem based documentation and patient instructions. Please see these sections of the encounter for additional information regarding this visit. CMA/ATC served as Education administrator during this visit. History, Physical, and Plan performed by medical provider. Documentation and orders reviewed and attested to.      [name], [place] [facility] Physician

## 2017-09-27 NOTE — Telephone Encounter (Signed)
Please advise 

## 2017-09-27 NOTE — Telephone Encounter (Signed)
Copied from [place] [id number]. Topic: Quick Communication - See Telephone Encounter >> Sep 27, 2017  2:27 PM Aurelio Brash B wrote: CRM for notification. See Telephone encounter for:  PT is calling to check on status of her Rx's  She states she has called the pharmacy and they tell her they have not received anything.  gabapentin (NEURONTIN) 100 MG capsule  and methylPREDNISolone (MEDROL DOSEPAK) 4 MG TBPK tablet   [facility], [address] AT [address] [phone] (Phone) [phone] (Fax)    09/27/17.

## 2017-09-28 NOTE — Telephone Encounter (Signed)
Called pharmacy and they confirmed rx had been received and picked up. Called pt and advised.

## 2017-09-28 NOTE — Telephone Encounter (Signed)
Pt would like to know if it is still OK to take the Celebrex while she is taking the Gabapentin and Medrol dosepak.

## 2017-09-28 NOTE — Telephone Encounter (Signed)
It appears that these medications were prescribed yesterday and sent to [facility]. Will contact pharmacy once open to confirm.

## 2017-09-28 NOTE — Telephone Encounter (Signed)
Spoke with patient and advised. Pt verbalized understanding and will call back Monday if sx have not improved.

## 2017-09-28 NOTE — Telephone Encounter (Signed)
She should not require the Celebrex while on the steroid.  Once she completes the steroid course she may resume the Celebrex the following day.  It is fine to take it with gabapentin

## 2017-10-03 ENCOUNTER — Telehealth: Payer: Self-pay | Admitting: Sports Medicine

## 2017-10-03 NOTE — Telephone Encounter (Signed)
Please advise 

## 2017-10-03 NOTE — Telephone Encounter (Signed)
Copied from [place]. Topic: General - Other >> Oct 03, 2017  1:56 PM [name] wrote: Reason for CRM: patient calling to let Dr Paulla Fore know that she is no better after taking the Prednisone  her lt side is hurting from her hip to her ankle she would also like to get her x ray results

## 2017-10-05 NOTE — Telephone Encounter (Signed)
Called pt and left VM to call the office.  

## 2017-10-05 NOTE — Telephone Encounter (Signed)
Pt returning call from [place].

## 2017-10-05 NOTE — Telephone Encounter (Signed)
Spoke with patient and advised per Dr. Paulla Fore, take it easy this weekend, do not go for walk, hold off on exercising, OK for gentle stretching. Concerned that she may have been overdoing it with walking and exercising. If no improvement in sx by Monday needs appointment for re-eval. Pt will call Monday with update. Pt verbalized understanding.

## 2017-10-09 NOTE — Telephone Encounter (Signed)
Patient calling to advise that she did rest over the weekend and she does feel better. She is hoping that she has turned the corner. She will just call the office back if she needs [place].

## 2017-10-22 DIAGNOSIS — M7752 Other enthesopathy of left foot: Secondary | ICD-10-CM | POA: Diagnosis not present

## 2017-10-22 DIAGNOSIS — M79605 Pain in left leg: Secondary | ICD-10-CM | POA: Diagnosis not present

## 2017-10-22 DIAGNOSIS — E7800 Pure hypercholesterolemia, unspecified: Secondary | ICD-10-CM | POA: Diagnosis not present

## 2017-10-22 DIAGNOSIS — I10 Essential (primary) hypertension: Secondary | ICD-10-CM | POA: Diagnosis not present

## 2017-10-31 ENCOUNTER — Encounter: Payer: Self-pay | Admitting: Sports Medicine

## 2017-10-31 ENCOUNTER — Other Ambulatory Visit: Payer: Self-pay | Admitting: Sports Medicine

## 2017-10-31 ENCOUNTER — Ambulatory Visit: Payer: Commercial Managed Care - HMO | Admitting: Sports Medicine

## 2017-10-31 VITALS — BP 120/64 | HR 74 | Ht 66.75 in | Wt 163.4 lb

## 2017-10-31 DIAGNOSIS — M7752 Other enthesopathy of left foot: Secondary | ICD-10-CM | POA: Diagnosis not present

## 2017-10-31 DIAGNOSIS — M5410 Radiculopathy, site unspecified: Secondary | ICD-10-CM | POA: Diagnosis not present

## 2017-10-31 DIAGNOSIS — M25552 Pain in left hip: Secondary | ICD-10-CM

## 2017-10-31 DIAGNOSIS — M4726 Other spondylosis with radiculopathy, lumbar region: Secondary | ICD-10-CM

## 2017-10-31 DIAGNOSIS — M79672 Pain in left foot: Secondary | ICD-10-CM | POA: Diagnosis not present

## 2017-10-31 DIAGNOSIS — M722 Plantar fascial fibromatosis: Secondary | ICD-10-CM | POA: Diagnosis not present

## 2017-10-31 MED ORDER — NITROGLYCERIN 0.2 MG/HR TD PT24: MEDICATED_PATCH | TRANSDERMAL | 1 refills | Status: DC

## 2017-10-31 NOTE — Assessment & Plan Note (Signed)
Patient has persistent symptoms consistent with retrocalcaneal bursitis given the focal pain directly over the retrocalcaneal area.  This is likely related to some of the increase in her activity lately that is new for her.  I like to see how she does with the below plan for the other aspects of her care per AVS prior to considering retrocalcaneal injection but this can be considered if any lack of improvement.  We will have her actually begin using nitroglycerin patches on both the hip and the Achilles to see if this can provide some increased pain relief and promote healing.  I am still suspicious this may be an underlying radiculitis that is contributing to her symptoms however it does not seem to be quite as irritated as last visit and there are no significant neural tension signs which is an improvement.  Unfortunately however her symptoms have continued to be quite significant and are interfering on a day-to-day basis.  If any lack of improvement with continued conservative care and nitroglycerin protocol MRI of the lumbar spine will be recommended.

## 2017-10-31 NOTE — Progress Notes (Signed)
Jodi Cline. Jodi Cline, Jodi Cline at [place] - 77 y.o. female MRN [id number]  Date of birth: 01/19/1941  Visit Date: 10/31/2017  PCP: Lavone Orn, MD   Referred by: Lavone Orn, MD   Scribe for today's visit: Wendy Poet, LAT, ATC     SUBJECTIVE:  Jodi Cline is here for No chief complaint on file. .   09/05/17: Compared to the last office visit, her previously described symptoms are improving  Current symptoms are mild-moderate & are nonradiating She has tried shoe inserts in the past, received steroid injection in the past, has been to PT. She has tried taking Naproxen but was prescribed Celebrex at her last visit, she does get relief with this. She was also provided with home therapeutic exercises. She did purchase arch support but she hasn't been using them daily.   09/27/17: Her L hip pain symptoms INITIALLY: Began 2-3 days ago w/ no MOI Described as aching pain 4/10  , radiating to the L LE Worsened with nothing stated Improved with nothing noted Additional associated symptoms include: no N/T noted in the L LE and no popping or clicking noted in the joints of the L LE At this time symptoms are worsening compared to onset w/ increased frequency of pain She has been using Celebrex which hasn't helped.  She has not tried ice or heat. L quad and glute pain  10/31/17: Compared to the last office visit on 09/27/17, her previously described L hip pain symptoms show no change.  She states that when she woke up on Monday, she could barely walk w/ L hip pain and stiffness.  Notes that both of her hips have been bothering her but the L is worse. Current symptoms are moderate & are nonradiating. She has been taking Celebrex and Gabapentin.  She was prescribed a medrol dose pack at her last visit that she has finished.  She is doing her HEP as best as she can.  She states that she works w/ her Physiological scientist  2x/week.   ROS Denies night time disturbances. Denies fevers, chills, or night sweats. Denies unexplained weight loss. Denies personal history of cancer. Denies changes in bowel or bladder habits. Denies recent unreported falls. Denies new or worsening dyspnea or wheezing. Denies headaches or dizziness.  Denies numbness, tingling or weakness  In the extremities.  Denies dizziness or presyncopal episodes Reports lower extremity edema    HISTORY & PERTINENT PRIOR DATA:  Prior History reviewed and updated per electronic medical record.  Significant history, findings, studies and interim changes include:  reports that she has quit smoking. she has never used smokeless tobacco. No results for input(s): HGBA1C, LABURIC, CREATINE in the last 8760 hours. No specialty comments available. Problem  Greater Trochanteric Pain Syndrome of Left Lower Extremity  Retrocalcaneal Bursitis (Back of Heel), Left    OBJECTIVE:  VS:  HT:5' 6.75" (169.5 cm)   WT:163 lb 6.4 oz (74.1 kg)  BMI:25.8    BP:120/64  HR:74bpm  TEMP: ( )  RESP:96 %   PHYSICAL EXAM: Constitutional: WDWN, Non-toxic appearing. Psychiatric: Alert & appropriately interactive.  Not depressed or anxious appearing. Respiratory: No increased work of breathing.  Trachea Midline Eyes: Pupils are equal.  EOM intact without nystagmus.  No scleral icterus  NEUROVASCULAR exam: No clubbing or cyanosis appreciated No significant venous stasis changes Capillary Refill: normal, less than 2 seconds   Left heel is overall well aligned.  She has improved dorsiflexion passively and actively.  DP and PT pulses are difficult to palpate but once found are 1+/4.  She has no significant pretibial edema.  No significant pain with palpation of the popliteal space and only minimal pain over the gluteal musculature no significant tenderness over the greater sciatic notch.  Negative straight leg raises.  Lower extremity strength is 5/5 in all  myotomes.  She does have weakness with left hip abduction with localization over the glute medius and piriformis musculature.   ASSESSMENT & PLAN:   1. Left hip pain   2. Left foot pain   3. Retrocalcaneal bursitis (back of heel), left   4. Osteoarthritis of spine with radiculopathy, lumbar region   5. Plantar fasciitis of left foot   6. Greater trochanteric pain syndrome of left lower extremity   7. Radiculitis    ++++++++++++++++++++++++++++++++++++++++++++ Orders & Meds: No orders of the defined types were placed in this encounter.   Meds ordered this encounter  Medications  . nitroGLYCERIN (NITRODUR - DOSED IN MG/24 HR) 0.2 mg/hr patch    Sig: Place 1/4 to 1/2 of a patch over affected region. Remove and replace once daily.  Slightly alter skin placement daily    Dispense:  30 patch    Refill:  1    For musculoskeletal purposes.  Okay to cut patch.    ++++++++++++++++++++++++++++++++++++++++++++ PLAN:    Retrocalcaneal bursitis (back of heel), left Patient has persistent symptoms consistent with retrocalcaneal bursitis given the focal pain directly over the retrocalcaneal area.  This is likely related to some of the increase in her activity lately that is new for her.  I like to see how she does with the below plan for the other aspects of her care per AVS prior to considering retrocalcaneal injection but this can be considered if any lack of improvement.  We will have her actually begin using nitroglycerin patches on both the hip and the Achilles to see if this can provide some increased pain relief and promote healing.  I am still suspicious this may be an underlying radiculitis that is contributing to her symptoms however it does not seem to be quite as irritated as last visit and there are no significant neural tension signs which is an improvement.  Unfortunately however her symptoms have continued to be quite significant and are interfering on a day-to-day basis.  If any lack of  improvement with continued conservative care and nitroglycerin protocol MRI of the lumbar spine will be recommended.   Follow-up: Return in about 4 weeks (around 11/28/2017).   Pertinent documentation may be included in additional procedure notes, imaging studies, problem based documentation and patient instructions. Please see these sections of the encounter for additional information regarding this visit. CMA/ATC served as Education administrator during this visit. History, Physical, and Plan performed by medical provider. Documentation and orders reviewed and attested to.      [name], [place] [facility] Physician

## 2017-10-31 NOTE — Patient Instructions (Addendum)
Would like for you to try stopping the gabapentin.  If you have any worsening of your symptoms within several days of stopping the gabapentin please call let us know.  This would indicate that we need to order an MRI of your back.  Please perform the therapeutic exercises that we gave you previously for your ankle as well as her hip on an almost daily basis.  It is okay to take your Celebrex as needed at this time.   Nitroglycerin Protocol   Apply 1/4 nitroglycerin patch to affected area daily.  Change position of patch within the affected area every 24 hours.  You may experience a headache during the first 1-2 weeks of using the patch, these should subside.  If you experience headaches after beginning nitroglycerin patch treatment, you may take your preferred over the counter pain reliever.  Another side effect of the nitroglycerin patch is skin irritation or rash related to patch adhesive.  Please notify our office if you develop more severe headaches or rash, and stop the patch.  Tendon healing with nitroglycerin patch may require 12 to 24 weeks depending on the extent of injury.  Men should not use if taking Viagra, Cialis, or Levitra.   Do not use if you have migraines or rosacea.    Please perform the exercise program that we have prepared for you and gone over in detail on a daily basis.  In addition to the handout you were provided you can access your program through: [email or web address]   Your unique program code is: [email or web address]

## 2017-10-31 NOTE — Procedures (Signed)
PROCEDURE NOTE: THERAPEUTIC EXERCISES (97110) 15 minutes spent for Therapeutic exercises as below and as referenced in the AVS. This included exercises focusing on stretching, strengthening, with significant focus on eccentric aspects.  Proper technique shown and discussed handout in great detail with ATC. All questions were discussed and answered.   Long term goals include an improvement in range of motion, strength, endurance as well as avoiding reinjury. Frequency of visits is one time as determined during today's  office visit. Frequency of exercises to be performed is as per handout.  EXERCISES REVIEWED:  hip abduction standing hip rotation

## 2017-11-04 ENCOUNTER — Other Ambulatory Visit: Payer: Self-pay | Admitting: Sports Medicine

## 2017-11-26 ENCOUNTER — Other Ambulatory Visit: Payer: Self-pay | Admitting: Sports Medicine

## 2017-11-28 ENCOUNTER — Ambulatory Visit: Payer: Medicare HMO | Admitting: Sports Medicine

## 2017-11-28 ENCOUNTER — Encounter: Payer: Self-pay | Admitting: Sports Medicine

## 2017-11-28 VITALS — BP 112/72 | HR 74 | Ht 66.75 in | Wt 161.8 lb

## 2017-11-28 DIAGNOSIS — M7752 Other enthesopathy of left foot: Secondary | ICD-10-CM | POA: Diagnosis not present

## 2017-11-28 DIAGNOSIS — M25552 Pain in left hip: Secondary | ICD-10-CM

## 2017-11-28 DIAGNOSIS — M722 Plantar fascial fibromatosis: Secondary | ICD-10-CM | POA: Diagnosis not present

## 2017-11-28 DIAGNOSIS — M4726 Other spondylosis with radiculopathy, lumbar region: Secondary | ICD-10-CM

## 2017-11-28 DIAGNOSIS — M79672 Pain in left foot: Secondary | ICD-10-CM | POA: Diagnosis not present

## 2017-11-28 NOTE — Progress Notes (Signed)
Jodi Cline. [place], Jodi Cline at [place] - 77 y.o. female MRN [id number]  Date of birth: 06-Jan-1941  Visit Date: 11/28/2017  PCP: Lavone Orn, MD   Referred by: Lavone Orn, MD  Scribe for today's visit: Wendy Poet, LAT, ATC     SUBJECTIVE:  Jodi Cline is here for Follow-up (L hip pain) .   09/05/17: Compared to the last office visit, her previously described symptoms are improving  Current symptoms are mild-moderate & are nonradiating She has tried shoe inserts in the past, received steroid injection in the past, has been to PT. She has tried taking Naproxen but was prescribed Celebrex at her last visit, she does get relief with this. She was also provided with home therapeutic exercises. She did purchase arch support but she hasn't been using them daily.   09/27/17: Her L hip pain symptoms INITIALLY: Began 2-3 days ago w/ no MOI Described as aching pain 4/10  , radiating to the L LE Worsened with nothing stated Improved with nothing noted Additional associated symptoms include: no N/T noted in the L LE and no popping or clicking noted in the joints of the L LE At this time symptoms are worsening compared to onset w/ increased frequency of pain She has been using Celebrex which hasn't helped.  She has not tried ice or heat. L quad and glute pain  10/31/17: Compared to the last office visit on 09/27/17, her previously described L hip pain symptoms show no change.  She states that when she woke up on Monday, she could barely walk w/ L hip pain and stiffness.  Notes that both of her hips have been bothering her but the L is worse. Current symptoms are moderate & are nonradiating. She has been taking Celebrex and Gabapentin.  She was prescribed a medrol dose pack at her last visit that she has finished.  She is doing her HEP as best as she can.  She states that she works w/ her Physiological scientist  2x/week.  11/27/2017: Compared to the last office visit on 10/31/17, her previously described L hip pain symptoms are improving w/ overall less frequency of pain. She states that her L medial ankle/Achille's pain continues to bother her as well. Current symptoms are moderate & are nonradiating She has been taking Celebrex and is using nitroglycerin patches on both her L hip and L medial ankle.  She has been doing her HEP and working w/ a Physiological scientist.     ROS Denies night time disturbances. Denies fevers, chills, or night sweats. Denies unexplained weight loss. Denies personal history of cancer. Denies changes in bowel or bladder habits. Denies recent unreported falls. Denies new or worsening dyspnea or wheezing. Denies headaches or dizziness.  Reports numbness, tingling or weakness  In the extremities - in L leg and feet Denies dizziness or presyncopal episodes Denies lower extremity edema    HISTORY & PERTINENT PRIOR DATA:  Prior History reviewed and updated per electronic medical record.  Significant/pertinent history, findings, studies include:  reports that she has quit smoking. She has never used smokeless tobacco. No results for input(s): HGBA1C, LABURIC, CREATINE in the last 8760 hours. No specialty comments available. No problems updated.  OBJECTIVE:  VS:  HT:5' 6.75" (169.5 cm)   WT:161 lb 12.8 oz (73.4 kg)  BMI:25.55    BP:112/72  HR:74bpm  TEMP: ( )  RESP:97 %   PHYSICAL EXAM: Constitutional: WDWN,  Non-toxic appearing. Psychiatric: Alert & appropriately interactive.  Not depressed or anxious appearing. Respiratory: No increased work of breathing.  Trachea Midline Eyes: Pupils are equal.  EOM intact without nystagmus.  No scleral icterus  Vascular Exam: warm to touch no edema  lower extremity neuro exam: unremarkable normal strength normal sensation normal reflexes  MSK Exam: Negative straight leg raises pain with retrocalcaneal squeeze but no  pain with calcaneal squeeze test.  There is good internal and external rotation bilateral hips.  Slightly tight straight leg raises bilaterally.  She does have pain with palpation of the left plantar fascial but this is mild.  She does have persistent hip weakness and pain with palpation of the greater trochanter on the left greater than right.   ASSESSMENT & PLAN:   1. Left hip pain   2. Left foot pain   3. Retrocalcaneal bursitis (back of heel), left   4. Osteoarthritis of spine with radiculopathy, lumbar region   5. Plantar fasciitis of left foot   6. Greater trochanteric pain syndrome of left lower extremity     PLAN: This is likely multifactorial in nature most likely contributing with underlying lumbar spondylosis with radiculitis that is causing some neuromuscular weakness and symptomatology including the plantar fasciitis.  We previously titrate medications and she seems to doing well use.  We will continue with her home therapeutic exercises and plan to check in with her for consideration of titrating down medications at follow-up.  Follow-up: Return in about 8 weeks (around 01/23/2018).      Please see additional documentation for Objective, Assessment and Plan sections. Pertinent additional documentation may be included in corresponding procedure notes, imaging studies, problem based documentation and patient instructions. Please see these sections of the encounter for additional information regarding this visit.  CMA/ATC served as Education administrator during this visit. History, Physical, and Plan performed by medical provider. Documentation and orders reviewed and attested to.      [name], [place] Sports [facility] Physician

## 2017-12-13 DIAGNOSIS — H02831 Dermatochalasis of right upper eyelid: Secondary | ICD-10-CM | POA: Diagnosis not present

## 2017-12-13 DIAGNOSIS — H02834 Dermatochalasis of left upper eyelid: Secondary | ICD-10-CM | POA: Diagnosis not present

## 2018-01-03 ENCOUNTER — Encounter: Payer: Self-pay | Admitting: Sports Medicine

## 2018-01-24 ENCOUNTER — Ambulatory Visit: Payer: Medicare HMO | Admitting: Sports Medicine

## 2018-01-24 ENCOUNTER — Encounter: Payer: Self-pay | Admitting: Sports Medicine

## 2018-01-24 VITALS — BP 130/70 | HR 75 | Ht 66.75 in | Wt 159.9 lb

## 2018-01-24 DIAGNOSIS — M7752 Other enthesopathy of left foot: Secondary | ICD-10-CM | POA: Diagnosis not present

## 2018-01-24 DIAGNOSIS — M4726 Other spondylosis with radiculopathy, lumbar region: Secondary | ICD-10-CM | POA: Diagnosis not present

## 2018-01-24 DIAGNOSIS — E559 Vitamin D deficiency, unspecified: Secondary | ICD-10-CM | POA: Diagnosis not present

## 2018-01-24 DIAGNOSIS — M79672 Pain in left foot: Secondary | ICD-10-CM | POA: Diagnosis not present

## 2018-01-24 DIAGNOSIS — Z791 Long term (current) use of non-steroidal anti-inflammatories (NSAID): Secondary | ICD-10-CM

## 2018-01-24 DIAGNOSIS — M25552 Pain in left hip: Secondary | ICD-10-CM

## 2018-01-24 DIAGNOSIS — M722 Plantar fascial fibromatosis: Secondary | ICD-10-CM

## 2018-01-24 LAB — COMPREHENSIVE METABOLIC PANEL
ALT: 18 U/L (ref 0–35)
AST: 18 U/L (ref 0–37)
Albumin: 4.3 g/dL (ref 3.5–5.2)
Alkaline Phosphatase: 47 U/L (ref 39–117)
BUN: 14 mg/dL (ref 6–23)
CO2: 29 mEq/L (ref 19–32)
Calcium: 9.4 mg/dL (ref 8.4–10.5)
Chloride: 100 mEq/L (ref 96–112)
Creatinine, Ser: 0.84 mg/dL (ref 0.40–1.20)
GFR: 69.8 mL/min (ref >60.00)
Glucose, Bld: 103 mg/dL — ABNORMAL HIGH (ref 70–99)
Potassium: 4.2 mEq/L (ref 3.5–5.1)
Sodium: 138 mEq/L (ref 135–145)
Total Bilirubin: 0.4 mg/dL (ref 0.2–1.2)
Total Protein: 7.4 g/dL (ref 6.0–8.3)

## 2018-01-24 LAB — CBC WITH DIFFERENTIAL/PLATELET
Basophils Absolute: 0.1 10*3/uL (ref 0.0–0.1)
Basophils Relative: 0.8 % (ref 0.0–3.0)
Eosinophils Absolute: 0.1 10*3/uL (ref 0.0–0.7)
Eosinophils Relative: 0.9 % (ref 0.0–5.0)
HCT: 40.3 % (ref 36.0–46.0)
Hemoglobin: 13.5 g/dL (ref 12.0–15.0)
Lymphocytes Relative: 22.5 % (ref 12.0–46.0)
Lymphs Abs: 1.8 10*3/uL (ref 0.7–4.0)
MCHC: 33.5 g/dL (ref 30.0–36.0)
MCV: 95.4 fl (ref 78.0–100.0)
Monocytes Absolute: 0.6 10*3/uL (ref 0.1–1.0)
Monocytes Relative: 7.5 % (ref 3.0–12.0)
Neutro Abs: 5.5 10*3/uL (ref 1.4–7.7)
Neutrophils Relative %: 68.3 % (ref 43.0–77.0)
Platelets: 50 10*3/uL — ABNORMAL LOW (ref 150.0–400.0)
RBC: 4.23 Mil/uL (ref 3.87–5.11)
RDW: 13.4 % (ref 11.5–15.5)
WBC: 8.1 10*3/uL (ref 4.0–10.5)

## 2018-01-24 LAB — VITAMIN D 25 HYDROXY (VIT D DEFICIENCY, FRACTURES): VITD: 41.3 ng/mL (ref 30.00–100.00)

## 2018-01-24 NOTE — Progress Notes (Signed)
Jodi Cline. Jodi Cline, [place] [facility] - 77 y.o. female MRN [id number]  Date of birth: 12/10/1940  Visit Date: 01/24/2018  PCP: Lavone Orn, MD   Referred by: Lavone Orn, MD  Scribe for today's visit: Josepha Pigg, CMA     SUBJECTIVE:  Jodi Cline is here for Follow-up (L hip pain)  09/05/17: Compared to the last office visit, her previously described symptoms are improving  Current symptoms are mild-moderate & are nonradiating She has tried shoe inserts in the past, received steroid injection in the past, has been to PT. She has tried taking Naproxen but was prescribed Celebrex at her last visit, she does get relief with this. She was also provided with home therapeutic exercises. She did purchase arch support but she hasn't been using them daily.   09/27/17: Her L hip pain symptoms INITIALLY: Began 2-3 days ago w/ no MOI Described as aching pain 4/10  , radiating to the L LE Worsened with nothing stated Improved with nothing noted Additional associated symptoms include: no N/T noted in the L LE and no popping or clicking noted in the joints of the L LE At this time symptoms are worsening compared to onset w/ increased frequency of pain She has been using Celebrex which hasn't helped.  She has not tried ice or heat. L quad and glute pain  10/31/17: Compared to the last office visit on 09/27/17, her previously described L hip pain symptoms show no change.  She states that when she woke up on Monday, she could barely walk w/ L hip pain and stiffness.  Notes that both of her hips have been bothering her but the L is worse. Current symptoms are moderate & are nonradiating. She has been taking Celebrex and Gabapentin.  She was prescribed a medrol dose pack at her last visit that she has finished.  She is doing her HEP as best as she can.  She states that she works w/ her Physiological scientist  2x/week.  11/27/2017: Compared to the last office visit on 10/31/17, her previously described L hip pain symptoms are improving w/ overall less frequency of pain. She states that her L medial ankle/Achille's pain continues to bother her as well. Current symptoms are moderate & are nonradiating She has been taking Celebrex and is using nitroglycerin patches on both her L hip and L medial ankle.  She has been doing her HEP and working w/ a Physiological scientist.     01/24/2018: Left Hip Compared to the last office visit, her previously described symptoms show no change, she has good days and bad days.  Current symptoms are moderate & are non-radiating during flare up.  She has been taking Celebrex as needed and following Nitro Protocol with no side effects. She has been doing home exercises 2 days a week with PT.   L Ankle Compared to the last office visit, her previously described symptoms show no change.  Current symptoms are severe (at worst) & are radiating to the side and bottom of her foot at times.  She has been taking Celebrex as needed and following Nitro Protocol with no side effects.   ROS Denies night time disturbances. Denies fevers, chills, or night sweats. Denies unexplained weight loss. Denies personal history of cancer. Denies changes in bowel or bladder habits. Denies recent unreported falls. Denies new or worsening dyspnea or wheezing. Denies headaches or dizziness.  Reports numbness in LE  extremity.  Denies dizziness or presyncopal episodes Denies lower extremity edema    HISTORY & PERTINENT PRIOR DATA:  Prior History reviewed and updated per electronic medical record.  Significant/pertinent history, findings, studies include:  reports that she has quit smoking. She has never used smokeless tobacco. No results for input(s): HGBA1C, LABURIC, CREATINE in the last 8760 hours. No specialty comments available. No problems updated.  OBJECTIVE:  VS:  HT:5' 6.75" (169.5  cm)   WT:159 lb 14.4 oz (72.5 kg)  BMI:25.25    BP:130/70  HR:75bpm  TEMP: ( )  RESP:95 %   PHYSICAL EXAM: Constitutional: WDWN, Non-toxic appearing. Psychiatric: Alert & appropriately interactive.  Not depressed or anxious appearing. Respiratory: No increased work of breathing.  Trachea Midline Eyes: Pupils are equal.  EOM intact without nystagmus.  No scleral icterus  Vascular Exam: warm to touch no edema  lower extremity neuro exam: unremarkable normal strength normal sensation  MSK Exam: Left hip is overall significantly improved from a tenderness standpoint she does remain tender over the greater trochanter.  Her left heel is only minimally tender to palpation today.  No pain with calcaneal squeeze.  Good plantarflexion and dorsiflexion strength as well as range of motion improvements with dorsiflexion to 95 degrees.   ASSESSMENT & PLAN:   1. Left hip pain   2. Left foot pain   3. Retrocalcaneal bursitis (back of heel), left   4. Osteoarthritis of spine with radiculopathy, lumbar region   5. Plantar fasciitis of left foot   6. NSAID long-term use   7. Vitamin D deficiency     PLAN: Overall markedly improved on intermittent anti-inflammatories from a functional standpoint she really has almost very little limitations in her day-to-day activities and is not having any issues at night.  She is taking Celebrex intermittently 2-3 times per week.  We will go ahead and check labs today given the duration of therapy for this and if any issues she will need to consider cessation of Celebrex but should do well with the appropriate intermittent dosing she is having.  We will plan to check in with her in 3 months and will check a vitamin D level today given the generalized body aches and pain that she does report although this is likely more reflective of age-related changes.  Follow-up: Return in about 3 months (around 04/26/2018).      Please see additional documentation for  Objective, Assessment and Plan sections. Pertinent additional documentation may be included in corresponding procedure notes, imaging studies, problem based documentation and patient instructions. Please see these sections of the encounter for additional information regarding this visit.  CMA/ATC served as Education administrator during this visit. History, Physical, and Plan performed by medical provider. Documentation and orders reviewed and attested to.      [name], [facility] Physician

## 2018-02-14 IMAGING — DX DG FOOT 2V*R*
2 series · 2 of 2 positions shown · non-contrast
Comparison: None.

CLINICAL DATA: Right foot pain without known injury.

EXAM:
RIGHT FOOT - 2 VIEW

[dg foot 2 views right (1 of 2)]
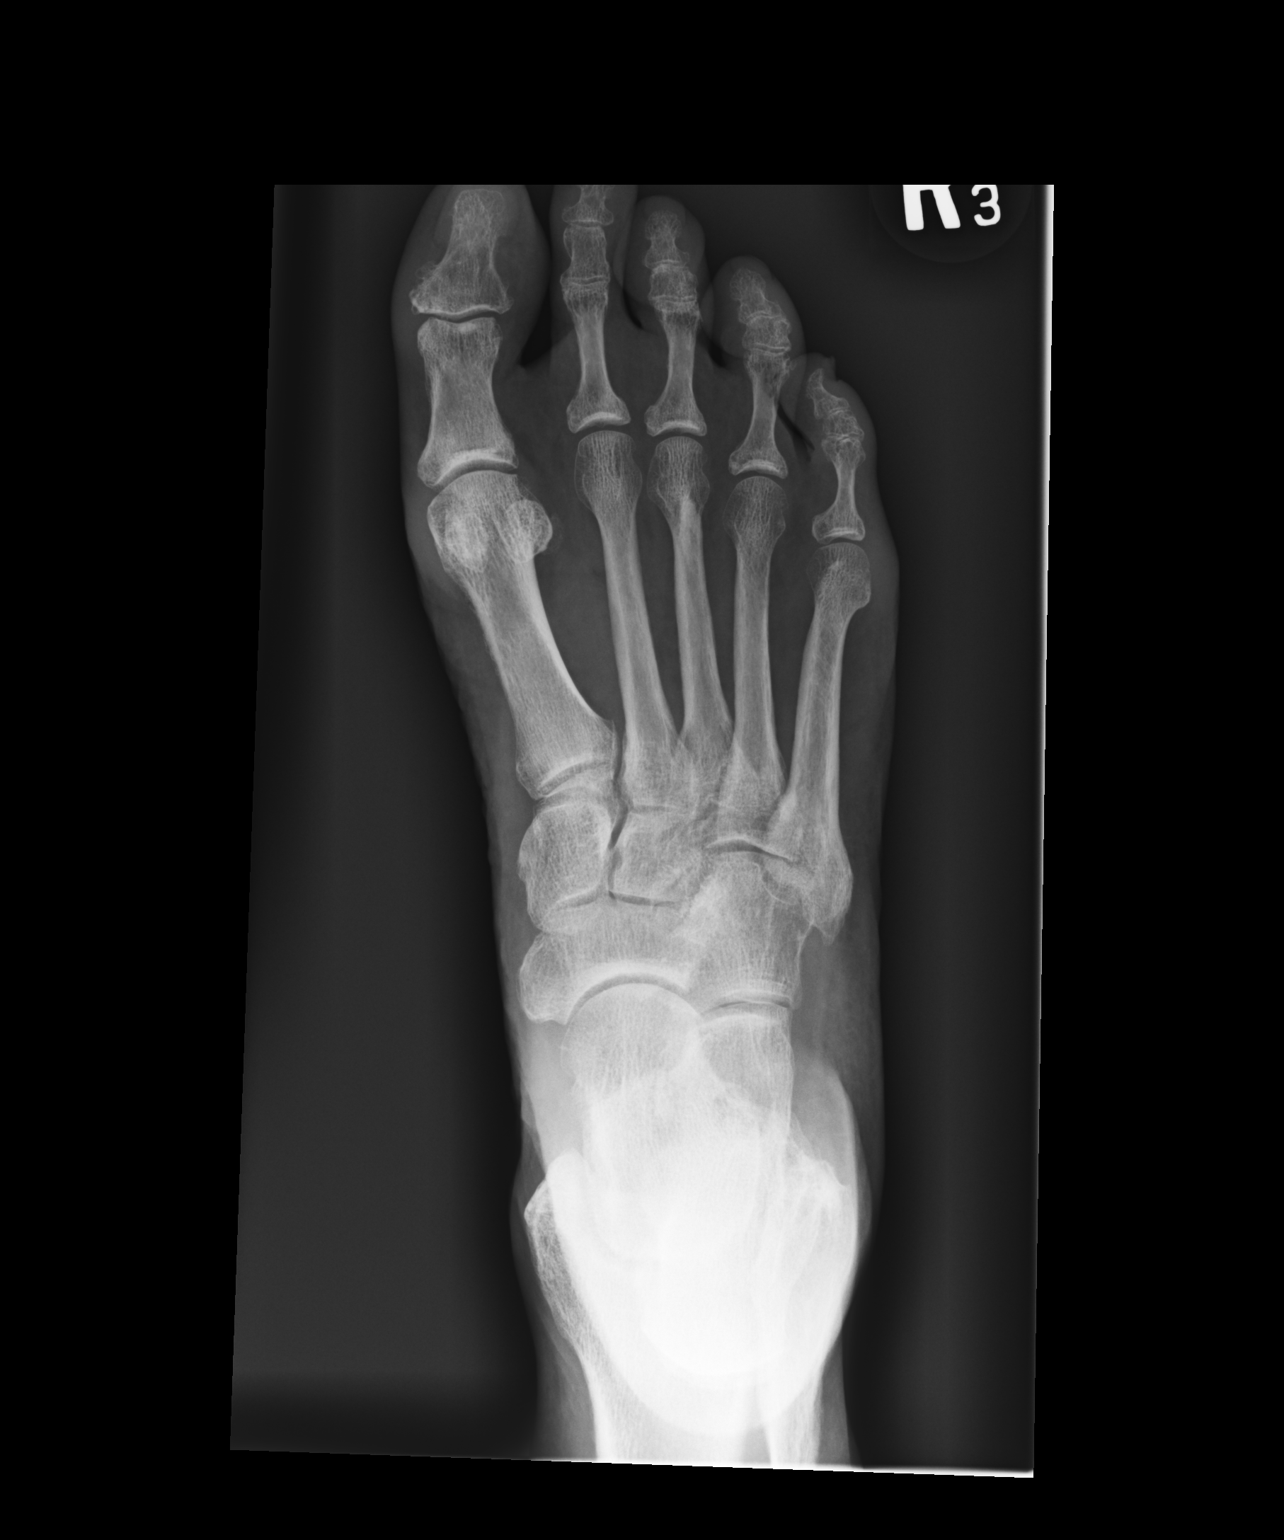

[dg foot 2 views right (2 of 2)]
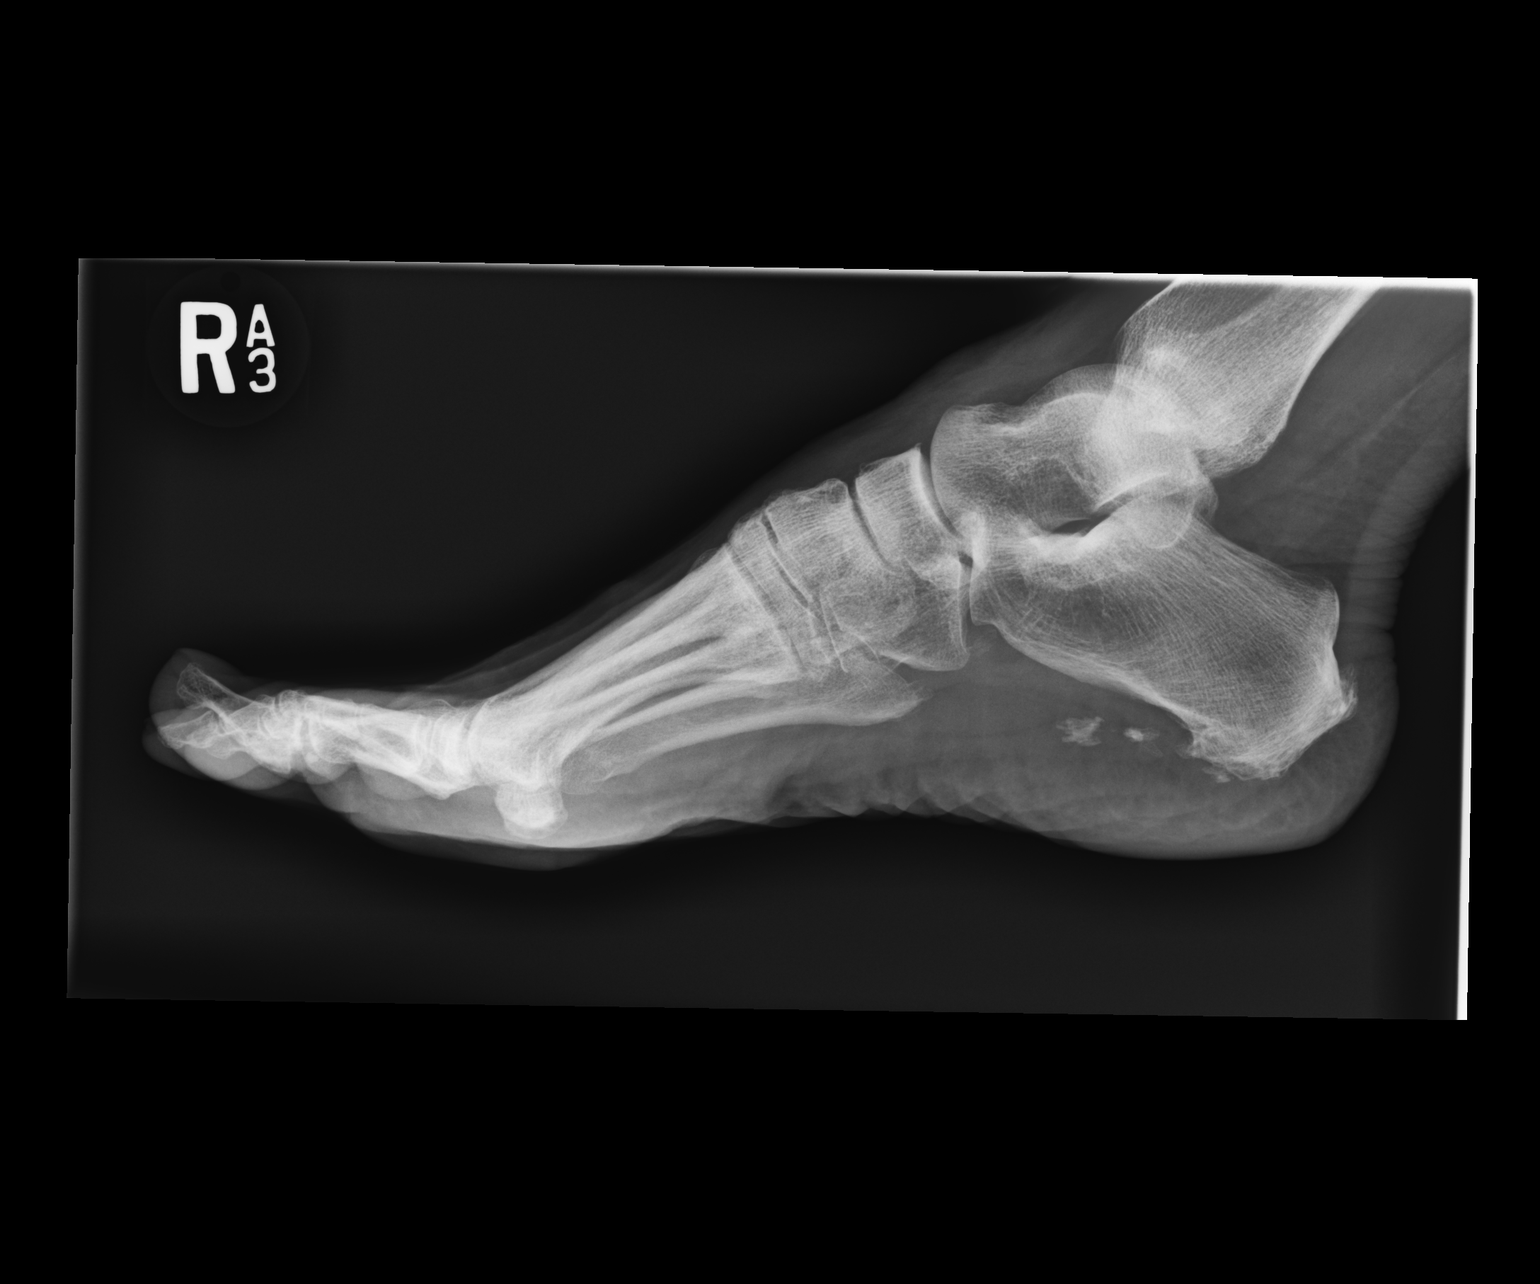

[2 of 2 positions shown; findings below may reference images not displayed]

FINDINGS: There is no evidence of fracture or dislocation. Mild spurring of
posterior calcaneus is noted. Degenerative changes seen involving
intertarsal joints. Soft tissues are unremarkable.
IMPRESSION: Degenerative changes as described above. No acute abnormality seen
in the right foot.

## 2018-02-14 IMAGING — DX DG ANKLE 2V *R*
2 series · 2 of 2 positions shown · non-contrast
Comparison: None.

CLINICAL DATA: Right ankle pain without injury.

EXAM:
RIGHT ANKLE - 2 VIEW

[dg ankle 2 views right (1 of 2)]
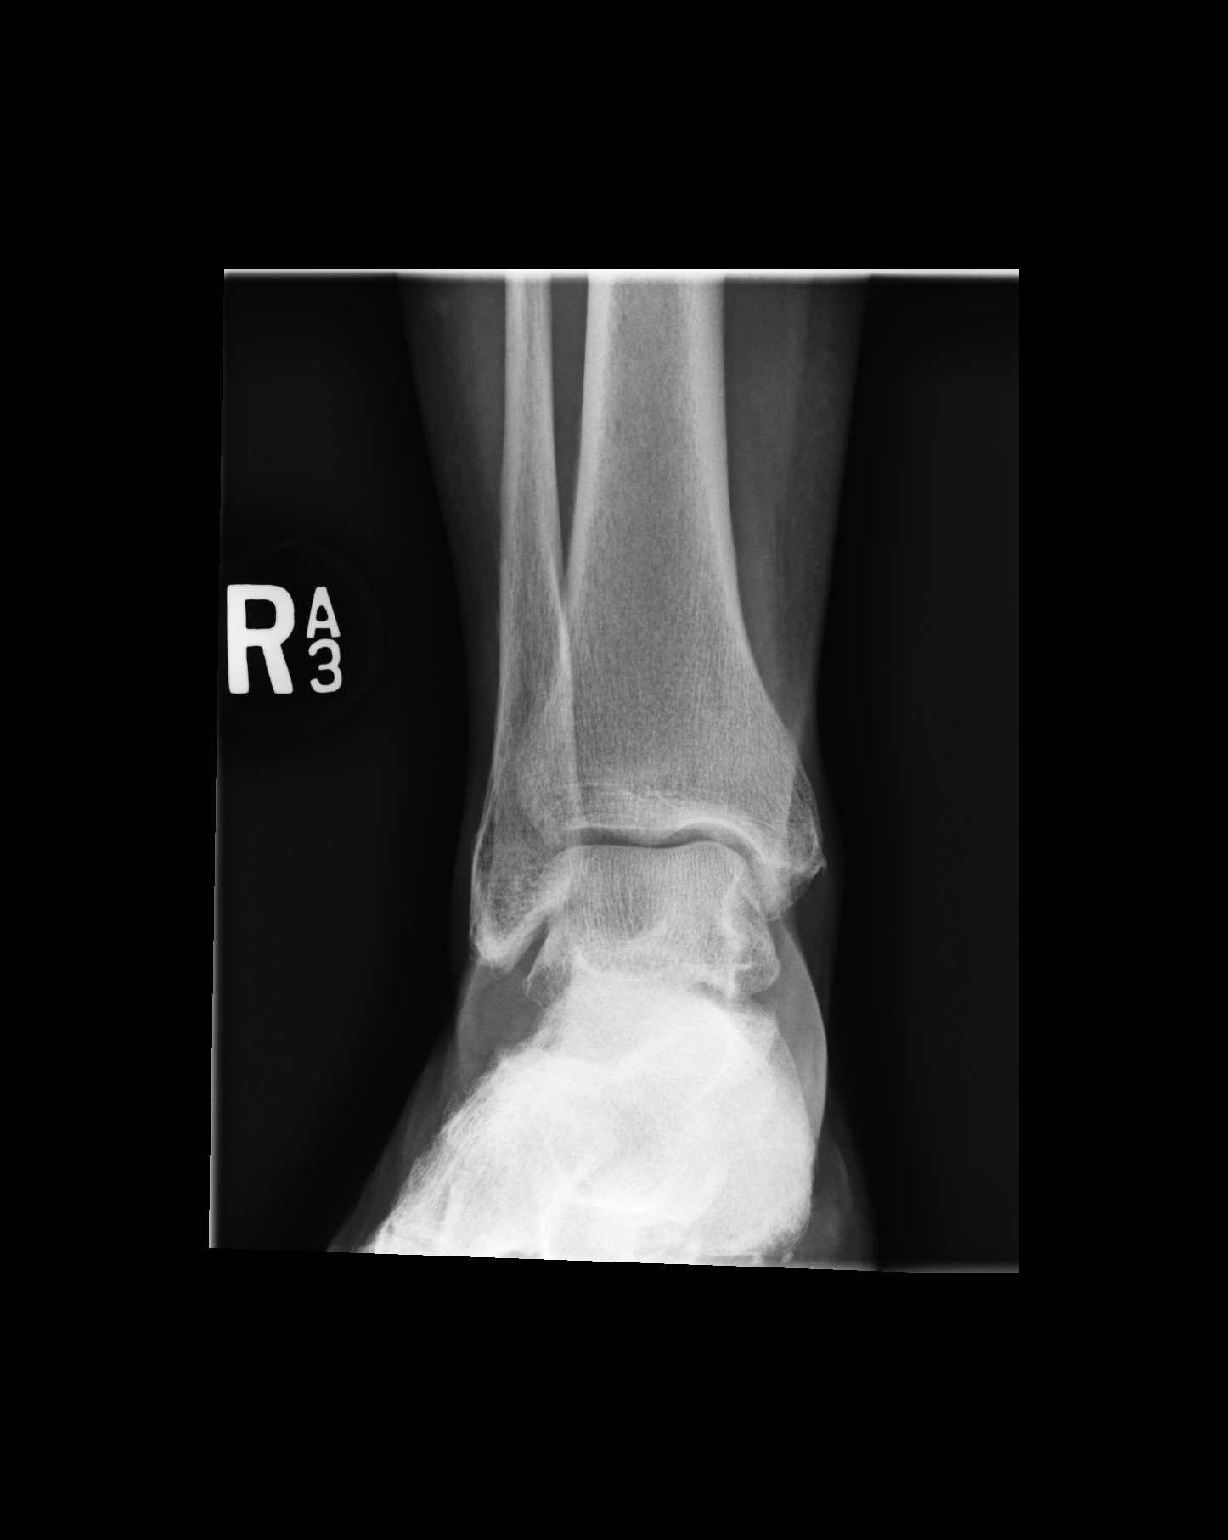

[dg ankle 2 views right (2 of 2)]
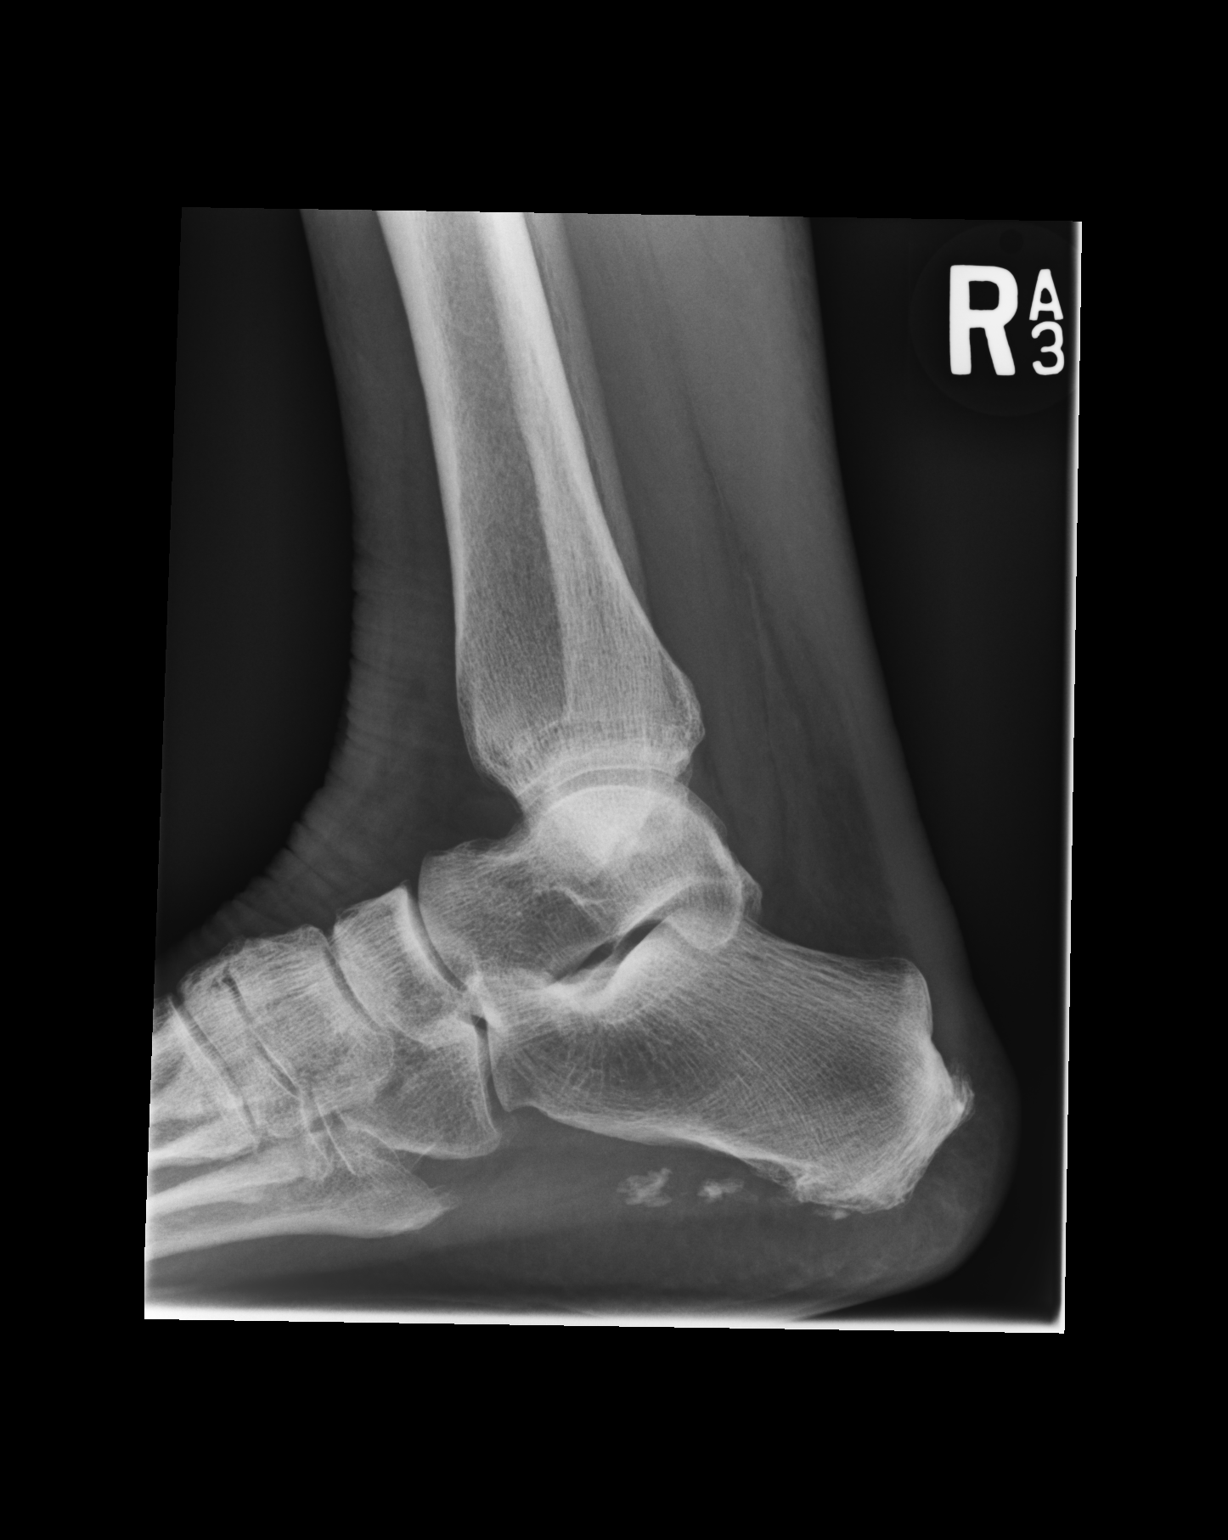

[2 of 2 positions shown; findings below may reference images not displayed]

FINDINGS: There is no evidence of fracture, dislocation, or joint effusion.
There is no evidence of arthropathy or other focal bone abnormality.
Soft tissues are unremarkable.
IMPRESSION: Normal right ankle.

## 2018-02-18 DIAGNOSIS — H02132 Senile ectropion of right lower eyelid: Secondary | ICD-10-CM | POA: Diagnosis not present

## 2018-02-18 DIAGNOSIS — H57813 Brow ptosis, bilateral: Secondary | ICD-10-CM | POA: Diagnosis not present

## 2018-02-18 DIAGNOSIS — H53483 Generalized contraction of visual field, bilateral: Secondary | ICD-10-CM | POA: Diagnosis not present

## 2018-02-18 DIAGNOSIS — H02135 Senile ectropion of left lower eyelid: Secondary | ICD-10-CM | POA: Diagnosis not present

## 2018-02-18 DIAGNOSIS — H02834 Dermatochalasis of left upper eyelid: Secondary | ICD-10-CM | POA: Diagnosis not present

## 2018-02-18 DIAGNOSIS — H02831 Dermatochalasis of right upper eyelid: Secondary | ICD-10-CM | POA: Diagnosis not present

## 2018-02-18 DIAGNOSIS — H02423 Myogenic ptosis of bilateral eyelids: Secondary | ICD-10-CM | POA: Diagnosis not present

## 2018-02-18 DIAGNOSIS — H0279 Other degenerative disorders of eyelid and periocular area: Secondary | ICD-10-CM | POA: Diagnosis not present

## 2018-02-18 DIAGNOSIS — H02413 Mechanical ptosis of bilateral eyelids: Secondary | ICD-10-CM | POA: Diagnosis not present

## 2018-04-10 ENCOUNTER — Other Ambulatory Visit: Payer: Self-pay | Admitting: Sports Medicine

## 2018-04-18 ENCOUNTER — Ambulatory Visit: Payer: Medicare HMO | Admitting: Sports Medicine

## 2018-04-22 DIAGNOSIS — H02135 Senile ectropion of left lower eyelid: Secondary | ICD-10-CM | POA: Diagnosis not present

## 2018-04-22 DIAGNOSIS — H02413 Mechanical ptosis of bilateral eyelids: Secondary | ICD-10-CM | POA: Diagnosis not present

## 2018-04-22 DIAGNOSIS — H02834 Dermatochalasis of left upper eyelid: Secondary | ICD-10-CM | POA: Diagnosis not present

## 2018-04-22 DIAGNOSIS — H02831 Dermatochalasis of right upper eyelid: Secondary | ICD-10-CM | POA: Diagnosis not present

## 2018-04-22 DIAGNOSIS — H02132 Senile ectropion of right lower eyelid: Secondary | ICD-10-CM | POA: Diagnosis not present

## 2018-04-22 DIAGNOSIS — H0279 Other degenerative disorders of eyelid and periocular area: Secondary | ICD-10-CM | POA: Diagnosis not present

## 2018-04-22 DIAGNOSIS — H02423 Myogenic ptosis of bilateral eyelids: Secondary | ICD-10-CM | POA: Diagnosis not present

## 2018-04-22 DIAGNOSIS — H57813 Brow ptosis, bilateral: Secondary | ICD-10-CM | POA: Diagnosis not present

## 2018-04-22 DIAGNOSIS — H53483 Generalized contraction of visual field, bilateral: Secondary | ICD-10-CM | POA: Diagnosis not present

## 2018-04-23 ENCOUNTER — Encounter: Payer: Self-pay | Admitting: Sports Medicine

## 2018-04-26 ENCOUNTER — Ambulatory Visit: Payer: Medicare HMO | Admitting: Sports Medicine

## 2018-05-11 DIAGNOSIS — R079 Chest pain, unspecified: Secondary | ICD-10-CM | POA: Diagnosis not present

## 2018-05-24 ENCOUNTER — Ambulatory Visit
Admission: RE | Admit: 2018-05-24 | Discharge: 2018-05-24 | Disposition: A | Discharge status: Home / Self Care | Payer: Medicare HMO | Source: Ambulatory Visit | Attending: Internal Medicine | Admitting: Internal Medicine

## 2018-05-24 ENCOUNTER — Other Ambulatory Visit: Payer: Self-pay | Admitting: Internal Medicine

## 2018-05-24 DIAGNOSIS — Z23 Encounter for immunization: Secondary | ICD-10-CM

## 2018-05-24 DIAGNOSIS — R0781 Pleurodynia: Secondary | ICD-10-CM

## 2018-05-24 DIAGNOSIS — R0782 Intercostal pain: Secondary | ICD-10-CM | POA: Diagnosis not present

## 2018-05-28 ENCOUNTER — Other Ambulatory Visit: Payer: Self-pay | Admitting: Internal Medicine

## 2018-05-28 DIAGNOSIS — R9389 Abnormal findings on diagnostic imaging of other specified body structures: Secondary | ICD-10-CM

## 2018-05-31 ENCOUNTER — Ambulatory Visit
Admission: RE | Admit: 2018-05-31 | Discharge: 2018-05-31 | Disposition: A | Discharge status: Home / Self Care | Payer: Medicare HMO | Source: Ambulatory Visit | Attending: Internal Medicine | Admitting: Internal Medicine

## 2018-05-31 DIAGNOSIS — J90 Pleural effusion, not elsewhere classified: Secondary | ICD-10-CM | POA: Diagnosis not present

## 2018-05-31 DIAGNOSIS — R9389 Abnormal findings on diagnostic imaging of other specified body structures: Secondary | ICD-10-CM

## 2018-05-31 MED ORDER — IOPAMIDOL (ISOVUE-300) INJECTION 61%
75.0000 mL | Freq: Once | INTRAVENOUS | Status: AC | PRN
  Administered 2018-05-31: 75 mL via INTRAVENOUS

## 2018-06-11 DIAGNOSIS — E7800 Pure hypercholesterolemia, unspecified: Secondary | ICD-10-CM | POA: Diagnosis not present

## 2018-06-11 DIAGNOSIS — Z1389 Encounter for screening for other disorder: Secondary | ICD-10-CM | POA: Diagnosis not present

## 2018-06-11 DIAGNOSIS — Z0000 Encounter for general adult medical examination without abnormal findings: Secondary | ICD-10-CM | POA: Diagnosis not present

## 2018-06-11 DIAGNOSIS — R0781 Pleurodynia: Secondary | ICD-10-CM | POA: Diagnosis not present

## 2018-06-11 DIAGNOSIS — I10 Essential (primary) hypertension: Secondary | ICD-10-CM | POA: Diagnosis not present

## 2018-06-11 DIAGNOSIS — Z86010 Personal history of colonic polyps: Secondary | ICD-10-CM | POA: Diagnosis not present

## 2018-06-24 DIAGNOSIS — Z1231 Encounter for screening mammogram for malignant neoplasm of breast: Secondary | ICD-10-CM | POA: Diagnosis not present

## 2018-07-05 ENCOUNTER — Other Ambulatory Visit: Payer: Self-pay | Admitting: Physician Assistant

## 2018-07-05 DIAGNOSIS — D229 Melanocytic nevi, unspecified: Secondary | ICD-10-CM | POA: Diagnosis not present

## 2018-07-05 DIAGNOSIS — L814 Other melanin hyperpigmentation: Secondary | ICD-10-CM | POA: Diagnosis not present

## 2018-07-05 DIAGNOSIS — L720 Epidermal cyst: Secondary | ICD-10-CM | POA: Diagnosis not present

## 2018-07-05 DIAGNOSIS — L570 Actinic keratosis: Secondary | ICD-10-CM | POA: Diagnosis not present

## 2018-07-05 DIAGNOSIS — D485 Neoplasm of uncertain behavior of skin: Secondary | ICD-10-CM | POA: Diagnosis not present

## 2018-07-16 DIAGNOSIS — Z961 Presence of intraocular lens: Secondary | ICD-10-CM | POA: Diagnosis not present

## 2018-07-16 DIAGNOSIS — H40013 Open angle with borderline findings, low risk, bilateral: Secondary | ICD-10-CM | POA: Diagnosis not present

## 2018-07-16 DIAGNOSIS — H40053 Ocular hypertension, bilateral: Secondary | ICD-10-CM | POA: Diagnosis not present

## 2018-07-16 DIAGNOSIS — H524 Presbyopia: Secondary | ICD-10-CM | POA: Diagnosis not present

## 2018-07-27 IMAGING — DX DG LUMBAR SPINE 2-3V
3 series · 3 of 3 positions shown · non-contrast
Comparison: None.

CLINICAL DATA: 76-year-old female with low back pain radiating to
the left hip and lower extremity for several days with no known
injury.

EXAM:
LUMBAR SPINE - 2-3 VIEW

[lumbar spine ap]
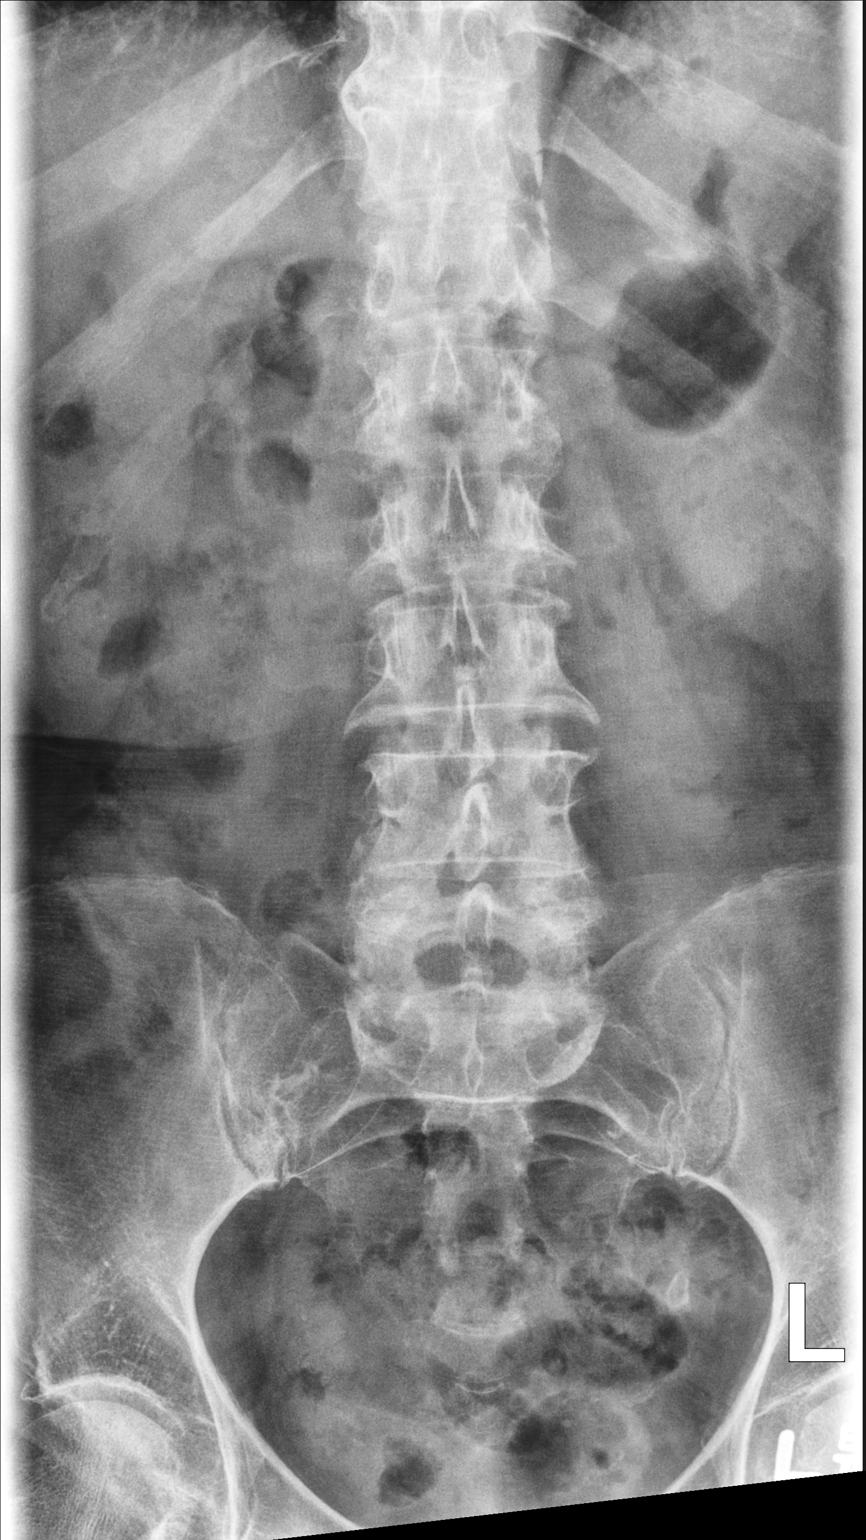

[lumbar spine lat (1 of 2)]
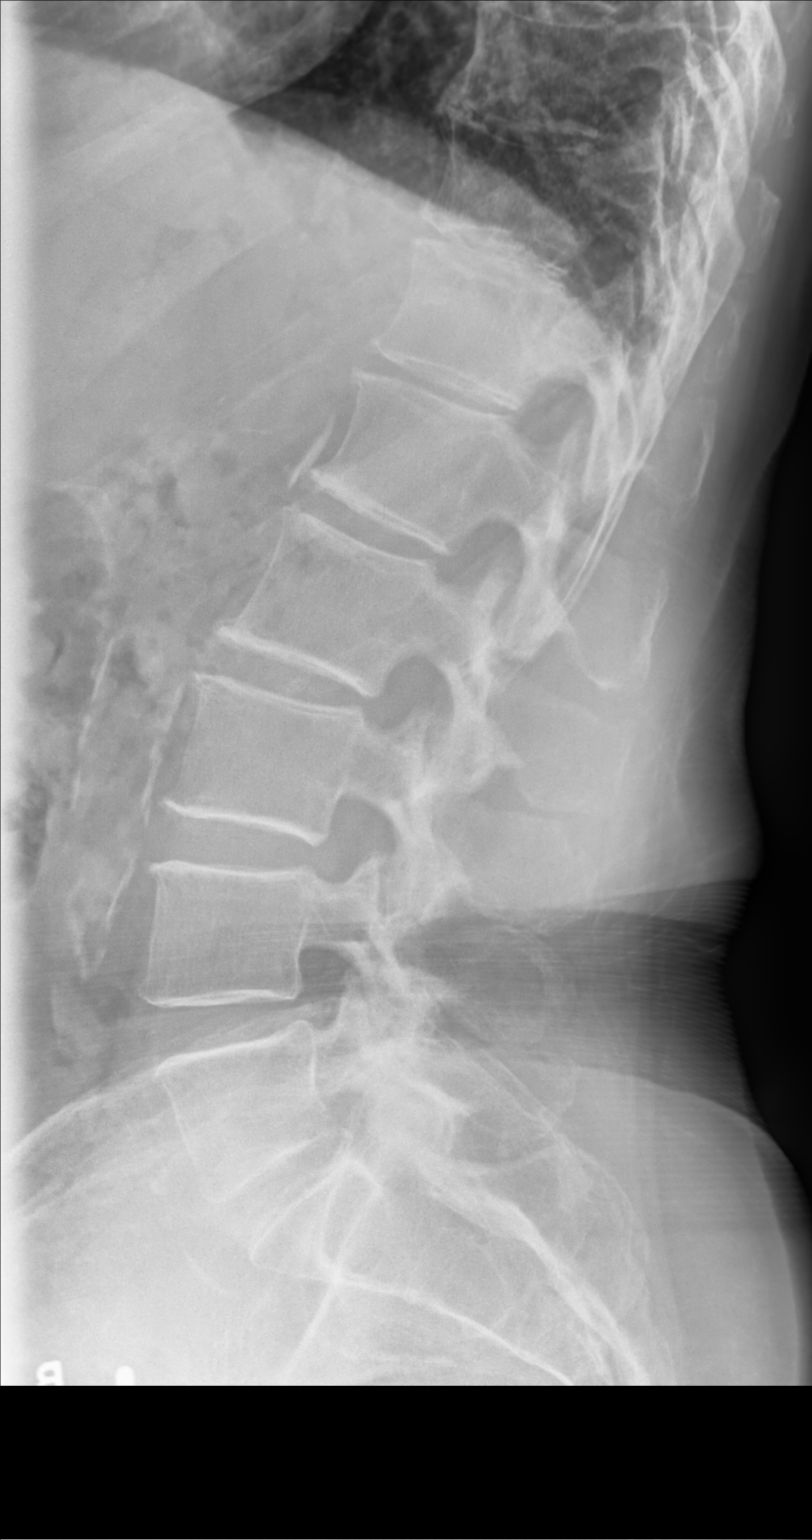

[lumbar spine lat (2 of 2)]
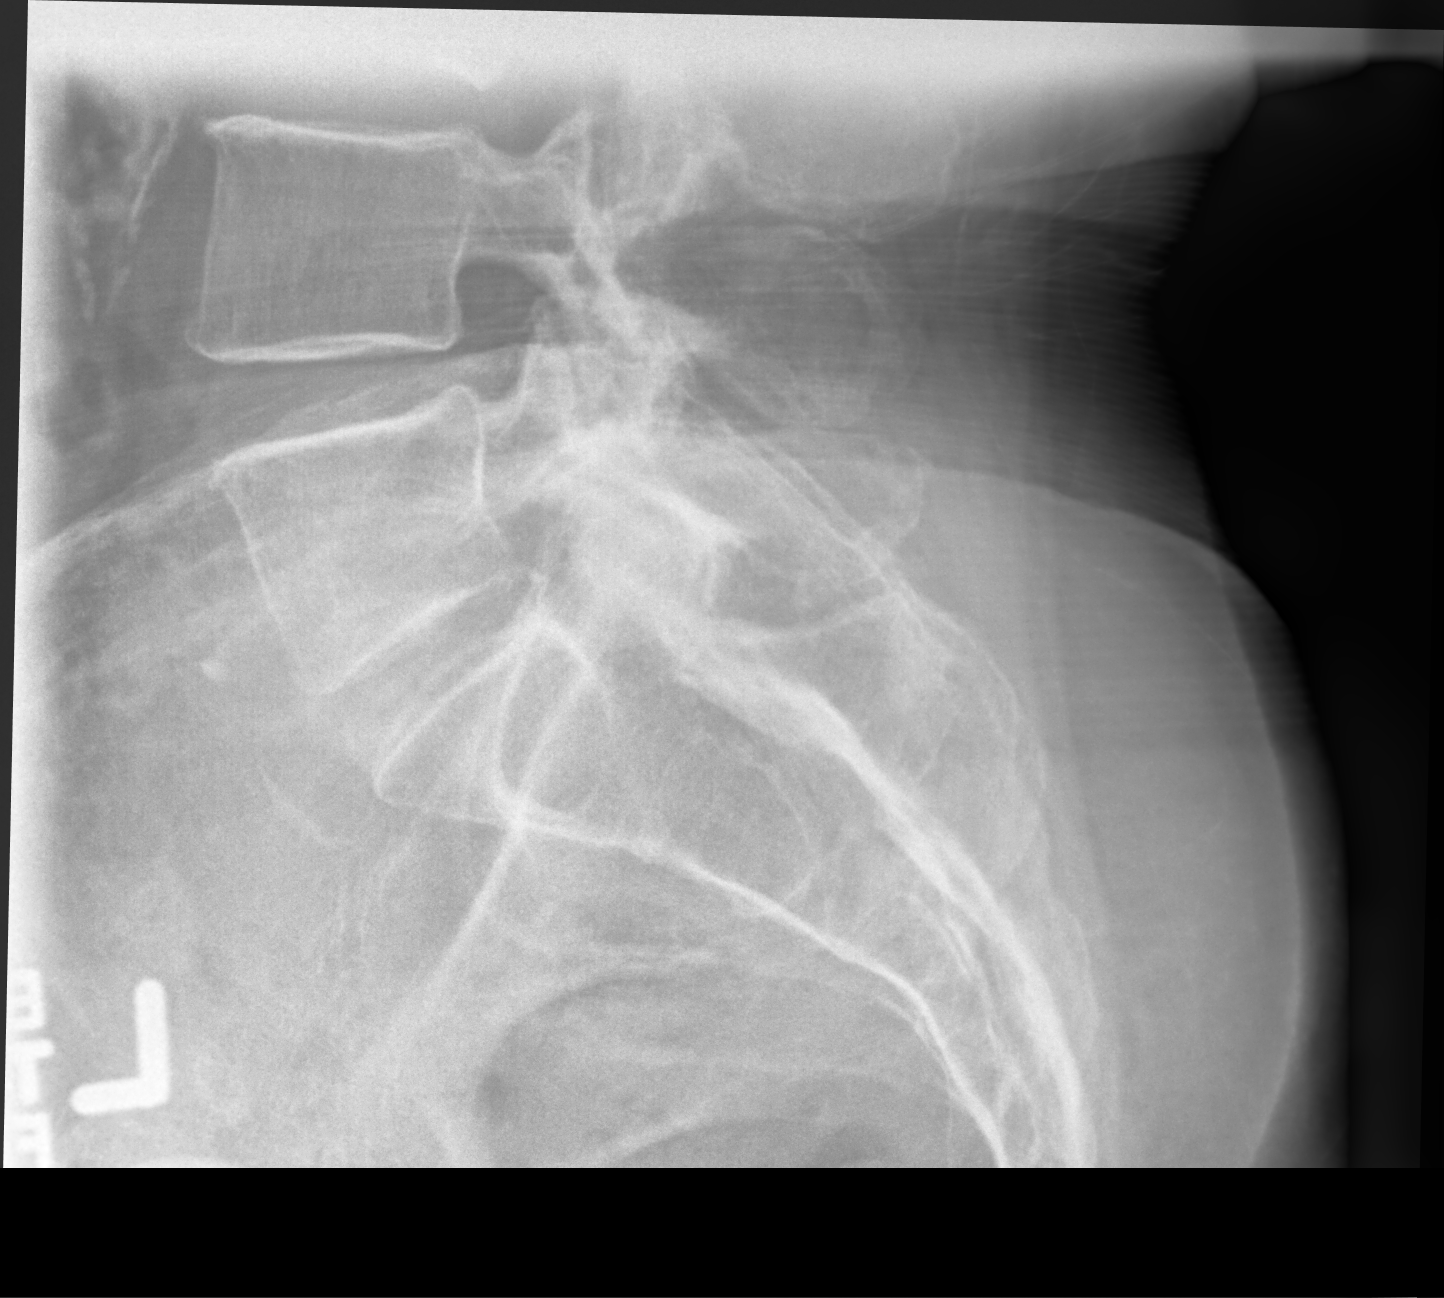

[3 of 3 positions shown; findings below may reference images not displayed]

FINDINGS: Calcified aortic atherosclerosis. Normal lumbar segmentation. The
preserved lumbar lordosis. Relatively preserved disc spaces.
Moderate lower lumbar facet hypertrophy, L4-L5 and L5-S1. The sacrum
appears intact. SI joints appear within normal limits. No acute
osseous abnormality identified.
IMPRESSION: 1.  No acute osseous abnormality in the lumbar spine.
2. Preserved lumbar disc spaces, but moderate lower lumbar facet
degeneration.
3.  Aortic Atherosclerosis (WSSAW-N36.6).

## 2018-12-12 DIAGNOSIS — Z86010 Personal history of colonic polyps: Secondary | ICD-10-CM | POA: Diagnosis not present

## 2018-12-12 DIAGNOSIS — I10 Essential (primary) hypertension: Secondary | ICD-10-CM | POA: Diagnosis not present

## 2018-12-12 DIAGNOSIS — E7800 Pure hypercholesterolemia, unspecified: Secondary | ICD-10-CM | POA: Diagnosis not present

## 2018-12-12 DIAGNOSIS — K649 Unspecified hemorrhoids: Secondary | ICD-10-CM | POA: Diagnosis not present

## 2019-02-03 ENCOUNTER — Other Ambulatory Visit: Payer: Medicare HMO

## 2019-02-03 ENCOUNTER — Telehealth: Payer: Self-pay

## 2019-02-03 DIAGNOSIS — Z20822 Contact with and (suspected) exposure to covid-19: Secondary | ICD-10-CM

## 2019-02-03 DIAGNOSIS — R6889 Other general symptoms and signs: Secondary | ICD-10-CM | POA: Diagnosis not present

## 2019-02-03 DIAGNOSIS — Z20828 Contact with and (suspected) exposure to other viral communicable diseases: Secondary | ICD-10-CM | POA: Diagnosis not present

## 2019-02-03 DIAGNOSIS — R197 Diarrhea, unspecified: Secondary | ICD-10-CM | POA: Diagnosis not present

## 2019-02-03 NOTE — Telephone Encounter (Signed)
Dr. Laurann Montana request COVID 19 test. Phone [phone],  Fax  [phone].

## 2019-02-05 LAB — NOVEL CORONAVIRUS, NAA: SARS-CoV-2, NAA: NOT DETECTED

## 2019-02-14 DIAGNOSIS — K648 Other hemorrhoids: Secondary | ICD-10-CM | POA: Diagnosis not present

## 2019-03-11 ENCOUNTER — Encounter: Payer: Self-pay | Admitting: *Deleted

## 2019-03-20 DIAGNOSIS — M79671 Pain in right foot: Secondary | ICD-10-CM | POA: Diagnosis not present

## 2019-03-20 DIAGNOSIS — F5104 Psychophysiologic insomnia: Secondary | ICD-10-CM | POA: Diagnosis not present

## 2019-03-20 DIAGNOSIS — M79672 Pain in left foot: Secondary | ICD-10-CM | POA: Diagnosis not present

## 2019-03-21 ENCOUNTER — Other Ambulatory Visit: Payer: Self-pay

## 2019-03-21 DIAGNOSIS — G629 Polyneuropathy, unspecified: Secondary | ICD-10-CM

## 2019-03-25 DIAGNOSIS — M25511 Pain in right shoulder: Secondary | ICD-10-CM | POA: Diagnosis not present

## 2019-03-25 DIAGNOSIS — W19XXXA Unspecified fall, initial encounter: Secondary | ICD-10-CM | POA: Diagnosis not present

## 2019-03-25 DIAGNOSIS — S42009A Fracture of unspecified part of unspecified clavicle, initial encounter for closed fracture: Secondary | ICD-10-CM | POA: Diagnosis not present

## 2019-03-27 ENCOUNTER — Ambulatory Visit (INDEPENDENT_AMBULATORY_CARE_PROVIDER_SITE_OTHER): Payer: Medicare HMO | Admitting: Orthopedic Surgery

## 2019-03-27 ENCOUNTER — Other Ambulatory Visit: Payer: Self-pay

## 2019-03-27 ENCOUNTER — Encounter: Payer: Self-pay | Admitting: Orthopedic Surgery

## 2019-03-27 ENCOUNTER — Ambulatory Visit (INDEPENDENT_AMBULATORY_CARE_PROVIDER_SITE_OTHER): Payer: Medicare HMO

## 2019-03-27 DIAGNOSIS — M898X1 Other specified disorders of bone, shoulder: Secondary | ICD-10-CM | POA: Diagnosis not present

## 2019-03-27 DIAGNOSIS — S4381XA Sprain of other specified parts of right shoulder girdle, initial encounter: Secondary | ICD-10-CM

## 2019-03-27 DIAGNOSIS — S42031A Displaced fracture of lateral end of right clavicle, initial encounter for closed fracture: Secondary | ICD-10-CM

## 2019-03-27 MED ORDER — OXYCODONE HCL 5 MG PO TABS: 5.0000 mg | ORAL_TABLET | Freq: Four times a day (QID) | ORAL | 0 refills | Status: DC | PRN

## 2019-03-27 NOTE — Progress Notes (Signed)
Office Visit Note   Patient: Jodi Cline           Date of Birth: 02-06-1941           MRN: [id number] Visit Date: 03/27/2019 Requested by: Lavone Orn, MD [address] [place],  [place] [address] PCP: Lavone Orn, MD  Subjective: Chief Complaint  Patient presents with  . Right Shoulder - Pain    HPI: Jodi Cline is a patient with right clavicle pain.  Date of injury 03/24/2019.  She fell when she was hitting a door frame.  She is right-hand dominant.  Does not do much sports but she does like to garden.  She has been taking Naprosyn which is been helping but she does have an allergy to tramadol.  She states that she has taken oxycodone in the past without difficulty.  She denies any other orthopedic complaints and no numbness and tingling.              ROS: All systems reviewed are negative as they relate to the chief complaint within the history of present illness.  Patient denies  fevers or chills.   Assessment & Plan: Visit Diagnoses:  1. Clavicle pain   2. Closed displaced fracture of acromial end of right clavicle, initial encounter   3. Coracoclavicular (ligament) sprain and strain, right, initial encounter     Plan: Impression is right clavicle pain with lateral and clavicle fracture and coracoclavicular ligament disruption.  In general this fracture looks like if it heals there is enough left of the [facility] joint ligaments that she should do well with this.  If the fracture displaces more particularly the medial end then we may need to consider plating and coracoclavicular ligament fixation.  Patient does not have a history of osteoporosis but I suspect that her bones may be osteopenic.  That reason I think the best option is sling immobilization with coming out once a day for elbow range of motion.  Come back in a week and we will do repeat x-rays to make sure that no fracture displacement has occurred.  Follow-Up Instructions: Return in about 1 week (around  04/03/2019).   Orders:  Orders Placed This Encounter  Procedures  . XR Clavicle Right   Meds ordered this encounter  Medications  . DISCONTD: oxyCODONE (OXY IR/ROXICODONE) 5 MG immediate release tablet    Sig: Take 1 tablet (5 mg total) by mouth every 6 (six) hours as needed for severe pain.    Dispense:  30 tablet    Refill:  0  . oxyCODONE (OXY IR/ROXICODONE) 5 MG immediate release tablet    Sig: Take 1 tablet (5 mg total) by mouth every 6 (six) hours as needed for severe pain.    Dispense:  30 tablet    Refill:  0      Procedures: No procedures performed   Clinical Data: No additional findings.  Objective: Vital Signs: There were no vitals taken for this visit.  Physical Exam:   Constitutional: Patient appears well-developed HEENT:  Head: Normocephalic Eyes:EOM are normal Neck: Normal range of motion Cardiovascular: Normal rate Pulmonary/chest: Effort normal Neurologic: Patient is alert Skin: Skin is warm Psychiatric: Patient has normal mood and affect    Ortho Exam: Ortho exam demonstrates good motor or sensory function of the right hand.  Radial pulses intact.  The shoulder girdle does not appear shortened.  Not much in the way of significant hump in the contour of the clavicle.  There  is ecchymosis and bruising noted.  Not much in the way of coarse grinding or crepitus with passive range of motion of the right shoulder.  Specialty Comments:  No specialty comments available.  Imaging: Xr Clavicle Right  Result Date: 03/27/2019 2 views right clavicle reviewed.  Patient has lateral third clavicle fracture with minimal displacement.  There is also increase in the coracoclavicular distance.  Shoulder is located.  No other acute fractures present.    PMFS History: Patient Active Problem List   Diagnosis Date Noted  . Greater trochanteric pain syndrome of left lower extremity 10/31/2017  . Retrocalcaneal bursitis (back of heel), left 10/31/2017  . Benign  essential HTN 09/27/2017  . Benign neoplasm of colon 09/27/2017  . Cannot sleep 09/27/2017  . Constipation by outlet dysfunction 09/27/2017  . Hematuria syndrome 09/27/2017  . HLD (hyperlipidemia) 09/27/2017  . Radiculitis 09/27/2017   Past Medical History:  Diagnosis Date  . BCC (basal cell carcinoma) 04/18/2005   Right mid thigh (CX35FU)  . BCC (basal cell carcinoma) 04/07/2009   right shoulder blade (Cx3)  . Hyperlipidemia   . Hypertension   . Insomnia   . Nodular basal cell carcinoma (BCC) 08/05/2013   right shoulder blade (EXC)  . SCC (squamous cell carcinoma) 02/19/2001   left arm (CX35FU)  . SCC (squamous cell carcinoma) 04/23/2013   left shin (txpbx)  . SCC (squamous cell carcinoma) 08/05/2013   left shin (txpbx)  . SCC (squamous cell carcinoma) 05/07/2014   Left shin (txpbx)  . SCC (squamous cell carcinoma) 08/04/2014   left shin (MOHS)  . Superficial basal cell carcinoma (BCC) 07/13/2015   right thigh (tx p bx)    History reviewed. No pertinent family history.  Past Surgical History:  Procedure Laterality Date  . CESAREAN SECTION    . THORACIC SPINE SURGERY     Dr. Tonita Cong  . TUBAL LIGATION     Social History   Occupational History  . Not on file  Tobacco Use  . Smoking status: Former Research scientist (life sciences)  . Smokeless tobacco: Never Used  Substance and Sexual Activity  . Alcohol use: Not on file  . Drug use: Not on file  . Sexual activity: Not on file

## 2019-04-03 ENCOUNTER — Ambulatory Visit (INDEPENDENT_AMBULATORY_CARE_PROVIDER_SITE_OTHER): Payer: Medicare HMO | Admitting: Orthopedic Surgery

## 2019-04-03 ENCOUNTER — Ambulatory Visit (INDEPENDENT_AMBULATORY_CARE_PROVIDER_SITE_OTHER): Payer: Medicare HMO

## 2019-04-03 ENCOUNTER — Encounter: Payer: Self-pay | Admitting: Orthopedic Surgery

## 2019-04-03 ENCOUNTER — Other Ambulatory Visit: Payer: Self-pay

## 2019-04-03 VITALS — Ht 66.5 in | Wt 160.0 lb

## 2019-04-03 DIAGNOSIS — S42031A Displaced fracture of lateral end of right clavicle, initial encounter for closed fracture: Secondary | ICD-10-CM

## 2019-04-03 NOTE — Progress Notes (Signed)
Is   Post-Op Visit Note   Patient: Jodi Cline           Date of Birth: 23-Jul-1941           MRN: [id number] Visit Date: 04/03/2019 PCP: Lavone Orn, MD   Assessment & Plan:  Chief Complaint:  Chief Complaint  Patient presents with  . Right Shoulder - Fracture, Follow-up    DOI 03/24/2019   Visit Diagnoses:  1. Closed displaced fracture of acromial end of right clavicle, initial encounter     Plan: Jodi Cline is a patient with right clavicle distal clavicle fracture.  Been doing reasonably well.  I think she spending a little bit more time out of the sling than I would like.  Taking oxycodone only 1 to 2/day.  Sleeping well.  On exam no significant visual deformity to the shoulder girdle.  Swelling has diminished some.  Overall there is less pain with movement.  Radiographs look essentially unchanged although there is slight change in the cortical fragment position compared to last week but no change in the clavicular displacement from the coracoid.  Plan at this time is to really stay in that sling for another 2 weeks to let that fracture heal so that the clavicle has some restraint.  Come back in 2 weeks for clinical recheck and repeat radiographs.  Follow-Up Instructions: Return in about 2 weeks (around 04/17/2019).   Orders:  Orders Placed This Encounter  Procedures  . XR Clavicle Right   No orders of the defined types were placed in this encounter.   Imaging: Xr Clavicle Right  Result Date: 04/03/2019 AP lateral right clavicle reviewed.  Again noted is distal clavicle fracture with minimal change in appearance compared to last week.  There is some increase in the coracoclavicular distance which again is unchanged compared to last week.   PMFS History: Patient Active Problem List   Diagnosis Date Noted  . Greater trochanteric pain syndrome of left lower extremity 10/31/2017  . Retrocalcaneal bursitis (back of heel), left 10/31/2017  . Benign essential HTN 09/27/2017  .  Benign neoplasm of colon 09/27/2017  . Cannot sleep 09/27/2017  . Constipation by outlet dysfunction 09/27/2017  . Hematuria syndrome 09/27/2017  . HLD (hyperlipidemia) 09/27/2017  . Radiculitis 09/27/2017   Past Medical History:  Diagnosis Date  . BCC (basal cell carcinoma) 04/18/2005   Right mid thigh (CX35FU)  . BCC (basal cell carcinoma) 04/07/2009   right shoulder blade (Cx3)  . Hyperlipidemia   . Hypertension   . Insomnia   . Nodular basal cell carcinoma (BCC) 08/05/2013   right shoulder blade (EXC)  . SCC (squamous cell carcinoma) 02/19/2001   left arm (CX35FU)  . SCC (squamous cell carcinoma) 04/23/2013   left shin (txpbx)  . SCC (squamous cell carcinoma) 08/05/2013   left shin (txpbx)  . SCC (squamous cell carcinoma) 05/07/2014   Left shin (txpbx)  . SCC (squamous cell carcinoma) 08/04/2014   left shin (MOHS)  . Superficial basal cell carcinoma (BCC) 07/13/2015   right thigh (tx p bx)    No family history on file.  Past Surgical History:  Procedure Laterality Date  . CESAREAN SECTION    . THORACIC SPINE SURGERY     Dr. Tonita Cong  . TUBAL LIGATION     Social History   Occupational History  . Not on file  Tobacco Use  . Smoking status: Former Research scientist (life sciences)  . Smokeless tobacco: Never Used  Substance and Sexual Activity  . Alcohol use:  Not on file  . Drug use: Not on file  . Sexual activity: Not on file

## 2019-04-17 ENCOUNTER — Other Ambulatory Visit: Payer: Self-pay

## 2019-04-17 ENCOUNTER — Encounter: Payer: Self-pay | Admitting: Orthopedic Surgery

## 2019-04-17 ENCOUNTER — Ambulatory Visit (INDEPENDENT_AMBULATORY_CARE_PROVIDER_SITE_OTHER): Payer: Medicare HMO

## 2019-04-17 ENCOUNTER — Ambulatory Visit (INDEPENDENT_AMBULATORY_CARE_PROVIDER_SITE_OTHER): Payer: Medicare HMO | Admitting: Orthopedic Surgery

## 2019-04-17 DIAGNOSIS — S42031A Displaced fracture of lateral end of right clavicle, initial encounter for closed fracture: Secondary | ICD-10-CM

## 2019-04-17 NOTE — Progress Notes (Signed)
   Post-Fracture Visit Note   Patient: Jodi Cline           Date of Birth: 12-16-1940           MRN: [id number] Visit Date: 04/17/2019 PCP: Lavone Orn, MD   Assessment & Plan:  Chief Complaint:  Chief Complaint  Patient presents with  . Clavicle Injury   Visit Diagnoses:  1. Closed displaced fracture of acromial end of right clavicle, initial encounter     Plan: Patient is a 78 year old female who presents to the office s/p right lateral third clavicle fracture.  Patient sustained her injury on 03/24/2019.  She has been in her sling 24/7 aside from showers since last appointment.  She states that she is feeling better with only occasional discomfort and is taking ibuprofen as needed.  On exam she still has slight mobility at the fracture site.  She has moderate tenderness overlying the fracture site.  She has pain with passive shoulder.  X-rays taken today show very slight superior displacement of the medial portion of the fracture with respect to the lateral fracture portion.  Fracture should start solidifying significantly within the next week to 2 weeks.  Recommended patient stay in the sling 24/7 until we see her back in 10 days.  Patient agrees with this plan and will follow-up in 10 days.  Follow-Up Instructions: No follow-ups on file.   Orders:  Orders Placed This Encounter  Procedures  . XR Clavicle Right   No orders of the defined types were placed in this encounter.   Imaging: No results found.  PMFS History: Patient Active Problem List   Diagnosis Date Noted  . Greater trochanteric pain syndrome of left lower extremity 10/31/2017  . Retrocalcaneal bursitis (back of heel), left 10/31/2017  . Benign essential HTN 09/27/2017  . Benign neoplasm of colon 09/27/2017  . Cannot sleep 09/27/2017  . Constipation by outlet dysfunction 09/27/2017  . Hematuria syndrome 09/27/2017  . HLD (hyperlipidemia) 09/27/2017  . Radiculitis 09/27/2017   Past Medical History:   Diagnosis Date  . BCC (basal cell carcinoma) 04/18/2005   Right mid thigh (CX35FU)  . BCC (basal cell carcinoma) 04/07/2009   right shoulder blade (Cx3)  . Hyperlipidemia   . Hypertension   . Insomnia   . Nodular basal cell carcinoma (BCC) 08/05/2013   right shoulder blade (EXC)  . SCC (squamous cell carcinoma) 02/19/2001   left arm (CX35FU)  . SCC (squamous cell carcinoma) 04/23/2013   left shin (txpbx)  . SCC (squamous cell carcinoma) 08/05/2013   left shin (txpbx)  . SCC (squamous cell carcinoma) 05/07/2014   Left shin (txpbx)  . SCC (squamous cell carcinoma) 08/04/2014   left shin (MOHS)  . Superficial basal cell carcinoma (BCC) 07/13/2015   right thigh (tx p bx)    History reviewed. No pertinent family history.  Past Surgical History:  Procedure Laterality Date  . CESAREAN SECTION    . THORACIC SPINE SURGERY     Dr. Tonita Cong  . TUBAL LIGATION     Social History   Occupational History  . Not on file  Tobacco Use  . Smoking status: Former Research scientist (life sciences)  . Smokeless tobacco: Never Used  Substance and Sexual Activity  . Alcohol use: Not on file  . Drug use: Not on file  . Sexual activity: Not on file

## 2019-04-22 ENCOUNTER — Other Ambulatory Visit: Payer: Self-pay

## 2019-04-22 ENCOUNTER — Ambulatory Visit (INDEPENDENT_AMBULATORY_CARE_PROVIDER_SITE_OTHER): Payer: Medicare HMO | Admitting: Neurology

## 2019-04-22 DIAGNOSIS — G8929 Other chronic pain: Secondary | ICD-10-CM

## 2019-04-22 DIAGNOSIS — G629 Polyneuropathy, unspecified: Secondary | ICD-10-CM

## 2019-04-22 DIAGNOSIS — M79672 Pain in left foot: Secondary | ICD-10-CM

## 2019-04-22 NOTE — Procedures (Signed)
[facility]  [place], [address]  [place], [place] [address] Tel: [phone] Fax:  [phone] Test Date:  04/22/2019  Patient: Jodi Cline DOB: Dec 09, 1940 Physician: Narda Amber, DO  Sex: Female Height: 5\' 6"  Ref Phys: Lavone Orn, MD  ID#: [id number] Temp: 31.0C Technician:    Patient Complaints: This is a 78 year old female referred for evaluation of bilateral feet pain.  NCV & EMG Findings: Nerve conduction testing was prematurely terminated at patient's request due to pain.  Findings are limited and shows: 1. Right superficial peroneal sensory responses within normal limits.   2. Right peroneal motor response is within normal limits.  Of note, proximal popliteal motor response was not tested due to pain.    Impression: This is an incomplete study, as electrodiagnostic testing was stopped at patient's request due to pain.    Given normal right superficial peroneal sensory response, neuropathy is unlikely.     ___________________________ Narda Amber, DO    Nerve Conduction Studies Anti Sensory Summary Table   Site NR Peak (ms) Norm Peak (ms) P-T Amp (V) Norm P-T Amp  Right Sup Peroneal Anti Sensory (Ant Lat Mall)  31C  12 cm    2.5 <4.6 6.7 >3   Motor Summary Table   Site NR Onset (ms) Norm Onset (ms) O-P Amp (mV) Norm O-P Amp Site1 Site2 Delta-0 (ms) Dist (cm) Vel (m/s) Norm Vel (m/s)  Right Peroneal Motor (Ext Dig Brev)  31C    unable to tolerate stim  Ankle    3.3 <6.0 2.6 >2.5 B Fib Ankle 7.9 35.0 44 >40  B Fib    11.2  2.5             Waveforms:

## 2019-04-28 ENCOUNTER — Ambulatory Visit (INDEPENDENT_AMBULATORY_CARE_PROVIDER_SITE_OTHER): Payer: Medicare HMO | Admitting: Orthopedic Surgery

## 2019-04-28 ENCOUNTER — Ambulatory Visit (INDEPENDENT_AMBULATORY_CARE_PROVIDER_SITE_OTHER): Payer: Medicare HMO

## 2019-04-28 ENCOUNTER — Encounter: Payer: Self-pay | Admitting: Orthopedic Surgery

## 2019-04-28 DIAGNOSIS — S42031A Displaced fracture of lateral end of right clavicle, initial encounter for closed fracture: Secondary | ICD-10-CM

## 2019-04-29 DIAGNOSIS — H40053 Ocular hypertension, bilateral: Secondary | ICD-10-CM | POA: Diagnosis not present

## 2019-04-30 ENCOUNTER — Encounter: Payer: Self-pay | Admitting: Orthopedic Surgery

## 2019-04-30 NOTE — Progress Notes (Signed)
    Post-Op Visit Note   Patient: Jodi Cline           Date of Birth: [birth year]           MRN: [id number] Visit Date: 04/28/2019 PCP: Lavone Orn, MD   Assessment & Plan:  Chief Complaint:  Chief Complaint  Patient presents with  . Follow-up   Visit Diagnoses:  1. Closed displaced fracture of acromial end of right clavicle, initial encounter      Plan: Jodi Cline is a patient who is now about 5 weeks out right distal clavicle fracture.  Date of injury 03/24/2019.  She is been in a sling.  On exam she has diminished tenderness.  I will have her stay in a sling 1 more week and no lifting for 2 weeks after that 3-week return for final check and radiographs.  Some evidence of healing clinically today.  Follow-Up Instructions: Return in about 3 weeks (around 05/19/2019).   Orders:  Orders Placed This Encounter  Procedures  . XR Clavicle Right   No orders of the defined types were placed in this encounter.   Imaging: No results found.  PMFS History: Patient Active Problem List   Diagnosis Date Noted  . Greater trochanteric pain syndrome of left lower extremity 10/31/2017  . Retrocalcaneal bursitis (back of heel), left 10/31/2017  . Benign essential HTN 09/27/2017  . Benign neoplasm of colon 09/27/2017  . Cannot sleep 09/27/2017  . Constipation by outlet dysfunction 09/27/2017  . Hematuria syndrome 09/27/2017  . HLD (hyperlipidemia) 09/27/2017  . Radiculitis 09/27/2017   Past Medical History:  Diagnosis Date  . BCC (basal cell carcinoma) 04/18/2005   Right mid thigh (CX35FU)  . BCC (basal cell carcinoma) 04/07/2009   right shoulder blade (Cx3)  . Hyperlipidemia   . Hypertension   . Insomnia   . Nodular basal cell carcinoma (BCC) 08/05/2013   right shoulder blade (EXC)  . SCC (squamous cell carcinoma) 02/19/2001   left arm (CX35FU)  . SCC (squamous cell carcinoma) 04/23/2013   left shin (txpbx)  . SCC (squamous cell carcinoma) 08/05/2013   left shin (txpbx)  .  SCC (squamous cell carcinoma) 05/07/2014   Left shin (txpbx)  . SCC (squamous cell carcinoma) 08/04/2014   left shin (MOHS)  . Superficial basal cell carcinoma (BCC) 07/13/2015   right thigh (tx p bx)    History reviewed. No pertinent family history.  Past Surgical History:  Procedure Laterality Date  . CESAREAN SECTION    . THORACIC SPINE SURGERY     Dr. Tonita Cong  . TUBAL LIGATION     Social History   Occupational History  . Not on file  Tobacco Use  . Smoking status: Former Research scientist (life sciences)  . Smokeless tobacco: Never Used  Substance and Sexual Activity  . Alcohol use: Not on file  . Drug use: Not on file  . Sexual activity: Not on file

## 2019-05-16 ENCOUNTER — Other Ambulatory Visit: Payer: Self-pay | Admitting: Physician Assistant

## 2019-05-16 DIAGNOSIS — L570 Actinic keratosis: Secondary | ICD-10-CM | POA: Diagnosis not present

## 2019-05-16 DIAGNOSIS — L720 Epidermal cyst: Secondary | ICD-10-CM | POA: Diagnosis not present

## 2019-05-19 ENCOUNTER — Ambulatory Visit (INDEPENDENT_AMBULATORY_CARE_PROVIDER_SITE_OTHER): Payer: Medicare HMO | Admitting: Orthopedic Surgery

## 2019-05-19 ENCOUNTER — Ambulatory Visit (INDEPENDENT_AMBULATORY_CARE_PROVIDER_SITE_OTHER): Payer: Medicare HMO

## 2019-05-19 ENCOUNTER — Encounter: Payer: Self-pay | Admitting: Orthopedic Surgery

## 2019-05-19 DIAGNOSIS — S42031A Displaced fracture of lateral end of right clavicle, initial encounter for closed fracture: Secondary | ICD-10-CM | POA: Diagnosis not present

## 2019-05-19 NOTE — Progress Notes (Signed)
   Post-Op Visit Note   Patient: Jodi Cline           Date of Birth: 01-28-1941           MRN: [id number] Visit Date: 05/19/2019 PCP: Lavone Orn, MD   Assessment & Plan:  Chief Complaint:  Chief Complaint  Patient presents with  . Right Shoulder - Follow-up   Visit Diagnoses:  1. Closed displaced fracture of acromial end of right clavicle, initial encounter     Plan: [name] is a 78 year old patient with right shoulder pain from lateral third clavicle fracture.  On exam she has improving motion and no tenderness at the fracture site.  No grinding or crepitus when I put her arm through a passive range of motion.  Radiographs look like it is healing very well.  I would not let her go at this time but still hold off on any exercises for 2 weeks.  Follow-up with me as needed.  Follow-Up Instructions: Return if symptoms worsen or fail to improve.   Orders:  Orders Placed This Encounter  Procedures  . XR Clavicle Right   No orders of the defined types were placed in this encounter.   Imaging: Xr Clavicle Right  Result Date: 05/19/2019 2 views right clavicle reviewed.  No change in fracture alignment of a lateral third clavicle fracture.  Some callus formation is present.  Cortical clavicular distance is increased but not changed from prior radiographs.   PMFS History: Patient Active Problem List   Diagnosis Date Noted  . Greater trochanteric pain syndrome of left lower extremity 10/31/2017  . Retrocalcaneal bursitis (back of heel), left 10/31/2017  . Benign essential HTN 09/27/2017  . Benign neoplasm of colon 09/27/2017  . Cannot sleep 09/27/2017  . Constipation by outlet dysfunction 09/27/2017  . Hematuria syndrome 09/27/2017  . HLD (hyperlipidemia) 09/27/2017  . Radiculitis 09/27/2017   Past Medical History:  Diagnosis Date  . BCC (basal cell carcinoma) 04/18/2005   Right mid thigh (CX35FU)  . BCC (basal cell carcinoma) 04/07/2009   right shoulder blade (Cx3)  .  Hyperlipidemia   . Hypertension   . Insomnia   . Nodular basal cell carcinoma (BCC) 08/05/2013   right shoulder blade (EXC)  . SCC (squamous cell carcinoma) 02/19/2001   left arm (CX35FU)  . SCC (squamous cell carcinoma) 04/23/2013   left shin (txpbx)  . SCC (squamous cell carcinoma) 08/05/2013   left shin (txpbx)  . SCC (squamous cell carcinoma) 05/07/2014   Left shin (txpbx)  . SCC (squamous cell carcinoma) 08/04/2014   left shin (MOHS)  . Superficial basal cell carcinoma (BCC) 07/13/2015   right thigh (tx p bx)    History reviewed. No pertinent family history.  Past Surgical History:  Procedure Laterality Date  . CESAREAN SECTION    . THORACIC SPINE SURGERY     Dr. Tonita Cong  . TUBAL LIGATION     Social History   Occupational History  . Not on file  Tobacco Use  . Smoking status: Former Research scientist (life sciences)  . Smokeless tobacco: Never Used  Substance and Sexual Activity  . Alcohol use: Not on file  . Drug use: Not on file  . Sexual activity: Not on file

## 2019-06-13 DIAGNOSIS — Z86010 Personal history of colonic polyps: Secondary | ICD-10-CM | POA: Diagnosis not present

## 2019-06-13 DIAGNOSIS — K641 Second degree hemorrhoids: Secondary | ICD-10-CM | POA: Diagnosis not present

## 2019-06-13 DIAGNOSIS — Z8719 Personal history of other diseases of the digestive system: Secondary | ICD-10-CM | POA: Diagnosis not present

## 2019-06-24 DIAGNOSIS — Z23 Encounter for immunization: Secondary | ICD-10-CM | POA: Diagnosis not present

## 2019-06-24 DIAGNOSIS — R8290 Unspecified abnormal findings in urine: Secondary | ICD-10-CM | POA: Diagnosis not present

## 2019-06-24 DIAGNOSIS — I10 Essential (primary) hypertension: Secondary | ICD-10-CM | POA: Diagnosis not present

## 2019-06-24 DIAGNOSIS — Z1389 Encounter for screening for other disorder: Secondary | ICD-10-CM | POA: Diagnosis not present

## 2019-06-24 DIAGNOSIS — Z1159 Encounter for screening for other viral diseases: Secondary | ICD-10-CM | POA: Diagnosis not present

## 2019-06-24 DIAGNOSIS — E7800 Pure hypercholesterolemia, unspecified: Secondary | ICD-10-CM | POA: Diagnosis not present

## 2019-06-24 DIAGNOSIS — Z0000 Encounter for general adult medical examination without abnormal findings: Secondary | ICD-10-CM | POA: Diagnosis not present

## 2019-06-24 DIAGNOSIS — M19079 Primary osteoarthritis, unspecified ankle and foot: Secondary | ICD-10-CM | POA: Diagnosis not present

## 2019-06-24 DIAGNOSIS — N3281 Overactive bladder: Secondary | ICD-10-CM | POA: Diagnosis not present

## 2019-07-08 ENCOUNTER — Ambulatory Visit: Payer: Medicare HMO | Attending: Internal Medicine

## 2019-07-08 ENCOUNTER — Other Ambulatory Visit: Payer: Self-pay

## 2019-07-08 DIAGNOSIS — R2689 Other abnormalities of gait and mobility: Secondary | ICD-10-CM

## 2019-07-08 DIAGNOSIS — Z1231 Encounter for screening mammogram for malignant neoplasm of breast: Secondary | ICD-10-CM | POA: Diagnosis not present

## 2019-07-08 DIAGNOSIS — Z803 Family history of malignant neoplasm of breast: Secondary | ICD-10-CM | POA: Diagnosis not present

## 2019-07-08 DIAGNOSIS — M25572 Pain in left ankle and joints of left foot: Secondary | ICD-10-CM

## 2019-07-08 DIAGNOSIS — M25571 Pain in right ankle and joints of right foot: Secondary | ICD-10-CM | POA: Insufficient documentation

## 2019-07-08 NOTE — Patient Instructions (Signed)
Access Code: [email or web address]  URL: [email or web address]  Date: 07/08/2019  Prepared by: Sigurd Sos   Exercises Long Sitting Calf Stretch with Strap - 5 reps - 1 sets - 10 hold - 3x daily - 7x weekly Seated Toe Raise - 10 reps - 1 sets - 5x daily - 7x weekly Heel Raise - 10 reps - 2 sets - 2x daily - 7x weekly Sit to Stand without Arm Support - 10 reps - 2 sets - 2x daily - 7x weekly Seated Ankle Circles - 10 reps - 2 sets - 3x daily - 7x weekly

## 2019-07-08 NOTE — Therapy (Signed)
[facility] [address], [place], [place], [address] Phone: [phone]   Fax:  [phone]  Physical Therapy Evaluation  Patient Details  Name: Jodi Cline MRN: [id number] Date of Birth: 1940-09-24 Referring Provider (PT): Lavone Orn, MD   Encounter Date: 07/08/2019  PT End of Session - 07/08/19 1235    Visit Number  1    Date for PT Re-Evaluation  09/02/19    Authorization Type  Medicare    PT Start Time  [id number]    PT Stop Time  1231    PT Time Calculation (min)  46 min    Activity Tolerance  Patient tolerated treatment well    Behavior During Therapy  [facility] for tasks assessed/performed       Past Medical History:  Diagnosis Date  . BCC (basal cell carcinoma) 04/18/2005   Right mid thigh (CX35FU)  . BCC (basal cell carcinoma) 04/07/2009   right shoulder blade (Cx3)  . Hyperlipidemia   . Hypertension   . Insomnia   . Nodular basal cell carcinoma (BCC) 08/05/2013   right shoulder blade (EXC)  . SCC (squamous cell carcinoma) 02/19/2001   left arm (CX35FU)  . SCC (squamous cell carcinoma) 04/23/2013   left shin (txpbx)  . SCC (squamous cell carcinoma) 08/05/2013   left shin (txpbx)  . SCC (squamous cell carcinoma) 05/07/2014   Left shin (txpbx)  . SCC (squamous cell carcinoma) 08/04/2014   left shin (MOHS)  . Superficial basal cell carcinoma (BCC) 07/13/2015   right thigh (tx p bx)    Past Surgical History:  Procedure Laterality Date  . CESAREAN SECTION    . THORACIC SPINE SURGERY     Dr. Tonita Cong  . TUBAL LIGATION      There were no vitals filed for this visit.   Subjective Assessment - 07/08/19 1149    Subjective  Pt presents to PT with chronic bilateral foot pain and tingling.  Pt has DJD.  A nerve conduction velocity test was attempted but too painful.    Pertinent History  HTN, cholesterol, skin cancer    Limitations  Standing;Walking    How long can you stand comfortably?  20 minutes  max    How long can you walk comfortably?  20 minutes max    Diagnostic tests  tried NCV- not able to complete.    Currently in Pain?  Yes    Pain Score  7    up to 9/10   Pain Location  Foot    Pain Orientation  Left;Right    Pain Descriptors / Indicators  Tingling;Burning;Tightness    Pain Type  Chronic pain    Pain Onset  More than a month ago    Pain Frequency  Intermittent    Aggravating Factors   walking, standing, after sitting for a long    Pain Relieving Factors  ibuprofen, non weightbearing         [facility] PT Assessment - 07/08/19 0001      Assessment   Medical Diagnosis  bilateral foot pain, DJD    Referring Provider (PT)  Lavone Orn, MD    Onset Date/Surgical Date  07/07/17   chronic   Next MD Visit  6 months    Prior Therapy  none      Precautions   Precautions  None      Restrictions   Weight Bearing Restrictions  No      Balance Screen   Has the patient fallen  in the past 6 months  No    Has the patient had a decrease in activity level because of a fear of falling?   No    Is the patient reluctant to leave their home because of a fear of falling?   No      Home Environment   Living Environment  Private residence    Type of [place] Access  Level entry    Home Layout  One level      Prior Function   Level of [place]  Retired    Leisure  gardening, walking- not able to do now due to [employer]   Overall Cognitive Status  Within Functional Limits for tasks assessed      Observation/Other Assessments   Focus on Therapeutic Outcomes (FOTO)   42% limitation      Posture/Postural Control   Posture/Postural Control  No significant limitations      ROM / Strength   AROM / PROM / Strength  AROM;PROM;Strength      AROM   Overall AROM   Deficits    Overall AROM Comments  foot A/ROM is limited by 25% due to stiffness.  all other A/ROM is WFLs      PROM   Overall PROM   Deficits    Overall PROM  Comments  toe and ankle flexibility limited by 25% with pain at end range of P/ROM at the toes.      Strength   Overall Strength  Within functional limits for tasks performed    Overall Strength Comments  4+/5 hip strength, 5/5 knees and 5/5 ankles      Palpation   Palpation comment  diffuse palpable tenderness over bil foot and ankle joints.      Transfers   Transfers  Stand to Sit;Sit to Stand;Independent with all Transfers    Five time sit to stand comments   11.95 seconds    Stand to Sit  7: Independent    Comments  alternating step taps on 6" step 11.95 seconds      Ambulation/Gait   Ambulation/Gait  Yes    Gait Pattern  Step-through pattern;Decreased arm swing - right;Decreased arm swing - left;Decreased stride length;Decreased dorsiflexion - left;Decreased dorsiflexion - right    Stairs  Yes    Stairs Assistance  7: Independent    Gait Comments  Rt single leg stance 6 seconds, Lt 10 seconds                Objective measurements completed on examination: See above findings.              PT Education - 07/08/19 1228    Education Details  Access Code: [email or web address]    Person(s) Educated  Patient    Methods  Explanation;Demonstration;Handout    Comprehension  Verbalized understanding;Returned demonstration       PT Short Term Goals - 07/08/19 1241      PT SHORT TERM GOAL #1   Title  be independent in initial HEP    Time  4    Period  Weeks    Status  New    Target Date  08/05/19      PT SHORT TERM GOAL #2   Title  report < or = to 5/10 bil foot pain with standing and walking    Time  4    Period  Weeks  Status  New    Target Date  08/05/19      PT SHORT TERM GOAL #3   Title  demonstrate consistent heel strike with gait on level surface    Time  4    Period  Weeks    Status  New    Target Date  08/05/19        PT Long Term Goals - 07/08/19 1242      PT LONG TERM GOAL #1   Title  be independent in advanced HEP    Time  8    Period   Weeks    Status  New    Target Date  09/02/19      PT LONG TERM GOAL #2   Title  report a 60% reduction in bil foot pain with standing and walking    Time  8    Period  Weeks    Status  New    Target Date  09/02/19      PT LONG TERM GOAL #3   Title  reduce foot pain to walk for 25 minutes without limitation    Time  8    Period  Weeks    Status  New    Target Date  09/02/19      PT LONG TERM GOAL #4   Title  demonstrate consistent heel strike and improved trunk rotation with gait on level surface to improve safety    Time  8    Period  Weeks    Status  New    Target Date  09/02/19      PT LONG TERM GOAL #5   Title  perform alternating step-taps x 8 on 6" step in < or = to 8 seconds to reduce falls risk    Time  8    Period  Weeks    Status  New    Target Date  09/02/19      Additional Long Term Goals   Additional Long Term Goals  Yes      PT LONG TERM GOAL #6   Title  reduce FOTO to < or = to 34% limitation    Time  8    Period  Weeks    Status  New    Target Date  09/02/19             Plan - 07/08/19 1248    Clinical Impression Statement  Pt presents to PT with chronic history of bilateral foot pain.  Pt rates pains as up to 7/10 with standing and walking and is limited to standing longer than 20 minutes due to pain.  Pt describes pain as shooting and burning.  Pt was not able to tolerate recent NCV test to determine neuropathy.  Pt demonstrates stiffness in bil foot and ankle joints and diffuse palpable tenderness.  Pt demonstrates foot drop bilaterally with gait on level surface with reduced arm swing and trunk rotation.  5x sit to stand is 11.93 seconds and within normal range for patient's age.  Pt will benefit from skilled PT to improve ankle and foot flexibility, gait and balance exercises to improve safety and manual therapy to address foot and ankle pain.    Personal Factors and Comorbidities  Comorbidity 2    Comorbidities  bil foot DJD, balance deficits     Examination-Activity Limitations  Stand;Stairs;Squat;Transfers;Locomotion Level    Examination-Participation Restrictions  Community Activity;Laundry;Yard Work;Meal Prep    Stability/Clinical Decision Making  Evolving/Moderate complexity  Clinical Decision Making  Moderate    Rehab Potential  Excellent    PT Frequency  2x / week    PT Duration  8 weeks    PT Treatment/Interventions  ADLs/Self Care Home Management;Electrical Stimulation;Cryotherapy;Moist Heat;Balance training;Therapeutic exercise;Therapeutic activities;Functional mobility training;Stair training;Gait training;Neuromuscular re-education;Patient/family education;Passive range of motion;Manual techniques;Taping    PT Next Visit Plan  review HEP, endurance, balance, strength, manual to bil feet with PROM and mobs    PT Home Exercise Plan  Access Code: [email or web address]    Consulted and Agree with Plan of Care  Patient       Patient will benefit from skilled therapeutic intervention in order to improve the following deficits and impairments:  Abnormal gait, Decreased activity tolerance, Decreased balance, Decreased strength, Decreased endurance, Decreased range of motion, Difficulty walking, Impaired flexibility, Pain  Visit Diagnosis: Pain in left ankle and joints of left foot - Plan: PT plan of care cert/re-cert  Pain in right ankle and joints of right foot - Plan: PT plan of care cert/re-cert  Other abnormalities of gait and mobility - Plan: PT plan of care cert/re-cert     Problem List Patient Active Problem List   Diagnosis Date Noted  . Greater trochanteric pain syndrome of left lower extremity 10/31/2017  . Retrocalcaneal bursitis (back of heel), left 10/31/2017  . Benign essential HTN 09/27/2017  . Benign neoplasm of colon 09/27/2017  . Cannot sleep 09/27/2017  . Constipation by outlet dysfunction 09/27/2017  . Hematuria syndrome 09/27/2017  . HLD (hyperlipidemia) 09/27/2017  . Radiculitis 09/27/2017     Sigurd Sos, PT 07/08/19 12:53 PM   [facility] [address], [place], [place], [address] Phone: [phone]   Fax:  [phone]  Name: Jodi Cline MRN: [id number] Date of Birth: 19-Jun-1941

## 2019-07-14 ENCOUNTER — Ambulatory Visit: Payer: Medicare HMO | Admitting: Physical Therapy

## 2019-07-14 ENCOUNTER — Encounter: Payer: Self-pay | Admitting: Physical Therapy

## 2019-07-14 ENCOUNTER — Other Ambulatory Visit: Payer: Self-pay

## 2019-07-14 DIAGNOSIS — M25572 Pain in left ankle and joints of left foot: Secondary | ICD-10-CM

## 2019-07-14 DIAGNOSIS — R2689 Other abnormalities of gait and mobility: Secondary | ICD-10-CM

## 2019-07-14 DIAGNOSIS — M25571 Pain in right ankle and joints of right foot: Secondary | ICD-10-CM

## 2019-07-14 NOTE — Therapy (Signed)
[facility] [address], [place], [place], [address] Phone: [phone]   Fax:  [phone]  Physical Therapy Treatment  Patient Details  Name: Jodi Cline MRN: [id number] Date of Birth: 1940/11/19 Referring Provider (PT): Lavone Orn, MD   Encounter Date: 07/14/2019  PT End of Session - 07/14/19 1025    Visit Number  2    Date for PT Re-Evaluation  09/02/19    Authorization Type  Medicare    PT Start Time  1024   10 min late   PT Stop Time  1102    PT Time Calculation (min)  38 min    Activity Tolerance  Patient tolerated treatment well    Behavior During Therapy  [facility] for tasks assessed/performed       Past Medical History:  Diagnosis Date  . BCC (basal cell carcinoma) 04/18/2005   Right mid thigh (CX35FU)  . BCC (basal cell carcinoma) 04/07/2009   right shoulder blade (Cx3)  . Hyperlipidemia   . Hypertension   . Insomnia   . Nodular basal cell carcinoma (BCC) 08/05/2013   right shoulder blade (EXC)  . SCC (squamous cell carcinoma) 02/19/2001   left arm (CX35FU)  . SCC (squamous cell carcinoma) 04/23/2013   left shin (txpbx)  . SCC (squamous cell carcinoma) 08/05/2013   left shin (txpbx)  . SCC (squamous cell carcinoma) 05/07/2014   Left shin (txpbx)  . SCC (squamous cell carcinoma) 08/04/2014   left shin (MOHS)  . Superficial basal cell carcinoma (BCC) 07/13/2015   right thigh (tx p bx)    Past Surgical History:  Procedure Laterality Date  . CESAREAN SECTION    . THORACIC SPINE SURGERY     Dr. Tonita Cong  . TUBAL LIGATION      There were no vitals filed for this visit.  Subjective Assessment - 07/14/19 1029    Subjective  I am doing my HEP but I do forget sometimes.    Pertinent History  HTN, cholesterol, skin cancer    Limitations  Standing;Walking    Diagnostic tests  tried NCV- not able to complete.    Currently in Pain?  No/denies                       [facility]  PT Treatment/Exercise - 07/14/19 0001      Ankle Exercises: Standing   Other Standing Ankle Exercises  10x walking 20 ft with concentrated heel strike       Ankle Exercises: Seated   Heel Raises  Both;10 reps    Toe Raise  15 reps    Toe Raise Limitations  VC to concentrate on tightening the front of her shins    Heel Slides  --   Seated red band ankle eversion 10x 5 sec hold, adde to HEP   Other Seated Ankle Exercises  Hamstring/Gastroc stretch Bil 2x 30 sec   Inversion ball squeeze 10x 5 sec hold added to HEP   Other Seated Ankle Exercises  Sit to stand 10x,    VC to push toes into the ground            PT Education - 07/14/19 1053    Education Details  HEP update: red band ankle eversion and towel/ball squeeze for adduction/inversion    Person(s) Educated  Patient    Methods  Explanation;Demonstration;Tactile cues;Verbal cues;Handout    Comprehension  Returned demonstration;Verbalized understanding       PT Short Term Goals -  07/14/19 1039      PT SHORT TERM GOAL #1   Title  be independent in initial HEP    Time  4    Period  Weeks    Status  Achieved        PT Long Term Goals - 07/08/19 1242      PT LONG TERM GOAL #1   Title  be independent in advanced HEP    Time  8    Period  Weeks    Status  New    Target Date  09/02/19      PT LONG TERM GOAL #2   Title  report a 60% reduction in bil foot pain with standing and walking    Time  8    Period  Weeks    Status  New    Target Date  09/02/19      PT LONG TERM GOAL #3   Title  reduce foot pain to walk for 25 minutes without limitation    Time  8    Period  Weeks    Status  New    Target Date  09/02/19      PT LONG TERM GOAL #4   Title  demonstrate consistent heel strike and improved trunk rotation with gait on level surface to improve safety    Time  8    Period  Weeks    Status  New    Target Date  09/02/19      PT LONG TERM GOAL #5   Title  perform alternating step-taps x 8 on 6" step in <  or = to 8 seconds to reduce falls risk    Time  8    Period  Weeks    Status  New    Target Date  09/02/19      Additional Long Term Goals   Additional Long Term Goals  Yes      PT LONG TERM GOAL #6   Title  reduce FOTO to < or = to 34% limitation    Time  8    Period  Weeks    Status  New    Target Date  09/02/19            Plan - 07/14/19 1027    Clinical Impression Statement  Pt arrives today with no new complaints. She is independent and compliant with her HEP. She did admitt to having a hard time remembering to do her HEP as often as directed. PTA educated pt to try and anchor an exercise to an already exisitng behavior like brushing teeth or having her AM coffee. Pt needs cuing to push her toes into the floor more during sit to stand. Once she does that her overall stability greatly improves. Pt was also able to demonstrate improved and more consistent heel strike when walking on the level flor here in the clinic.    Personal Factors and Comorbidities  Comorbidity 2    Comorbidities  bil foot DJD, balance deficits    Examination-Activity Limitations  Stand;Stairs;Squat;Transfers;Locomotion Level    Examination-Participation Restrictions  Community Activity;Laundry;Yard Work;Meal Prep    Stability/Clinical Decision Making  Evolving/Moderate complexity    Rehab Potential  Excellent    PT Frequency  2x / week    PT Duration  8 weeks    PT Treatment/Interventions  ADLs/Self Care Home Management;Electrical Stimulation;Cryotherapy;Moist Heat;Balance training;Therapeutic exercise;Therapeutic activities;Functional mobility training;Stair training;Gait training;Neuromuscular re-education;Patient/family education;Passive range of motion;Manual techniques;Taping    PT Next Visit  Plan  Review new ankle exercises given today, manual to feet/PROM if time available, Nustep, cont to work with sit to stand functional strength. Pt may benefit from rolling a soft ball under her feet to stimulate  her nerve endings and increase the messaging speed to her brain.    PT Home Exercise Plan  Access Code: [email or web address]    Consulted and Agree with Plan of Care  Patient       Patient will benefit from skilled therapeutic intervention in order to improve the following deficits and impairments:  Abnormal gait, Decreased activity tolerance, Decreased balance, Decreased strength, Decreased endurance, Decreased range of motion, Difficulty walking, Impaired flexibility, Pain  Visit Diagnosis: Pain in left ankle and joints of left foot  Pain in right ankle and joints of right foot  Other abnormalities of gait and mobility     Problem List Patient Active Problem List   Diagnosis Date Noted  . Greater trochanteric pain syndrome of left lower extremity 10/31/2017  . Retrocalcaneal bursitis (back of heel), left 10/31/2017  . Benign essential HTN 09/27/2017  . Benign neoplasm of colon 09/27/2017  . Cannot sleep 09/27/2017  . Constipation by outlet dysfunction 09/27/2017  . Hematuria syndrome 09/27/2017  . HLD (hyperlipidemia) 09/27/2017  . Radiculitis 09/27/2017    Dakari Stabler, PTA 07/14/2019, 12:52 PM  [facility] [address] [place], [place], [address] Phone: [phone]   Fax:  [phone]  Name: Jodi Cline MRN: [id number] Date of Birth: 07/14/1941  Access Code: [email or web address]  URL: [email or web address]  Date: 07/14/2019  Prepared by: Myrene Galas   Exercises  Long Sitting Calf Stretch with Strap - 5 reps - 1 sets - 10 hold - 3x daily - 7x weekly  Seated Toe Raise - 10 reps - 1 sets - 5x daily - 7x weekly  Heel Raise - 10 reps - 2 sets - 2x daily - 7x weekly  Sit to Stand without Arm Support - 10 reps - 2 sets - 2x daily - 7x weekly  Seated Ankle Circles - 10 reps - 2 sets - 3x daily - 7x weekly  Seated Hamstring Stretch - 2 reps - 1 sets - 30 hold - 2x daily - 7x weekly  Seated Ankle Eversion  with Resistance - 10 reps - 3 hold - 1x daily - 7x weekly

## 2019-07-17 ENCOUNTER — Other Ambulatory Visit: Payer: Self-pay

## 2019-07-17 ENCOUNTER — Ambulatory Visit: Payer: Medicare HMO

## 2019-07-17 DIAGNOSIS — M25571 Pain in right ankle and joints of right foot: Secondary | ICD-10-CM

## 2019-07-17 DIAGNOSIS — M25572 Pain in left ankle and joints of left foot: Secondary | ICD-10-CM | POA: Diagnosis not present

## 2019-07-17 DIAGNOSIS — R2689 Other abnormalities of gait and mobility: Secondary | ICD-10-CM | POA: Diagnosis not present

## 2019-07-17 NOTE — Therapy (Signed)
[facility] [address] [place], [place], [address] Phone: [phone]   Fax:  [phone]  Physical Therapy Treatment  Patient Details  Name: Jodi Cline MRN: [id number] Date of Birth: Jan 09, 1941 Referring Provider (PT): Lavone Orn, MD   Encounter Date: 07/17/2019  PT End of Session - 07/17/19 1016    Visit Number  3    Date for PT Re-Evaluation  09/02/19    Authorization Type  Medicare    PT Start Time  0931    PT Stop Time  1016    PT Time Calculation (min)  45 min    Activity Tolerance  Patient tolerated treatment well    Behavior During Therapy  [facility] for tasks assessed/performed       Past Medical History:  Diagnosis Date  . BCC (basal cell carcinoma) 04/18/2005   Right mid thigh (CX35FU)  . BCC (basal cell carcinoma) 04/07/2009   right shoulder blade (Cx3)  . Hyperlipidemia   . Hypertension   . Insomnia   . Nodular basal cell carcinoma (BCC) 08/05/2013   right shoulder blade (EXC)  . SCC (squamous cell carcinoma) 02/19/2001   left arm (CX35FU)  . SCC (squamous cell carcinoma) 04/23/2013   left shin (txpbx)  . SCC (squamous cell carcinoma) 08/05/2013   left shin (txpbx)  . SCC (squamous cell carcinoma) 05/07/2014   Left shin (txpbx)  . SCC (squamous cell carcinoma) 08/04/2014   left shin (MOHS)  . Superficial basal cell carcinoma (BCC) 07/13/2015   right thigh (tx p bx)    Past Surgical History:  Procedure Laterality Date  . CESAREAN SECTION    . THORACIC SPINE SURGERY     Dr. Tonita Cong  . TUBAL LIGATION      There were no vitals filed for this visit.  Subjective Assessment - 07/17/19 0938    Subjective  I am feelig OK.  My Lt knee is bothering me.  My feet are feeling much better.    Currently in Pain?  No/denies                       [facility] Adult PT Treatment/Exercise - 07/17/19 0001      Exercises   Exercises  Knee/Hip      Knee/Hip Exercises: Standing   Walking with Sports Cord  15# forward with emphasis on heel strike       Ankle Exercises: Aerobic   Nustep  Level 1x 6 minutes   PT present to discuss progress     Ankle Exercises: Standing   Heel Raises  20 reps    Toe Raise  20 reps    Other Standing Ankle Exercises  tandem stance: 2 x 30 seconds each      Ankle Exercises: Seated   Other Seated Ankle Exercises  Hamstring/Gastroc stretch Bil 3x 30 sec    Other Seated Ankle Exercises  Sit to stand 10x,    VC to push toes into the ground     Ankle Exercises: Supine   T-Band  DF with red band 2x10                PT Short Term Goals - 07/17/19 0941      PT SHORT TERM GOAL #2   Title  report < or = to 5/10 bil foot pain with standing and walking    Status  Achieved      PT SHORT TERM GOAL #3   Title  demonstrate consistent heel strike with gait on level surface    Baseline  50% of the time    Time  4    Period  Weeks    Status  On-going        PT Long Term Goals - 07/08/19 1242      PT LONG TERM GOAL #1   Title  be independent in advanced HEP    Time  8    Period  Weeks    Status  New    Target Date  09/02/19      PT LONG TERM GOAL #2   Title  report a 60% reduction in bil foot pain with standing and walking    Time  8    Period  Weeks    Status  New    Target Date  09/02/19      PT LONG TERM GOAL #3   Title  reduce foot pain to walk for 25 minutes without limitation    Time  8    Period  Weeks    Status  New    Target Date  09/02/19      PT LONG TERM GOAL #4   Title  demonstrate consistent heel strike and improved trunk rotation with gait on level surface to improve safety    Time  8    Period  Weeks    Status  New    Target Date  09/02/19      PT LONG TERM GOAL #5   Title  perform alternating step-taps x 8 on 6" step in < or = to 8 seconds to reduce falls risk    Time  8    Period  Weeks    Status  New    Target Date  09/02/19      Additional Long Term Goals   Additional Long Term Goals   Yes      PT LONG TERM GOAL #6   Title  reduce FOTO to < or = to 34% limitation    Time  8    Period  Weeks    Status  New    Target Date  09/02/19            Plan - 07/17/19 0945    Clinical Impression Statement  Pt denies any foot pain at this time.  Pt demonstrates more consistent heel strike with gait and reports that she feels much more balanced with gait when doing this.  Pt did well with balance and proprioception exercises today and required close supervision for safety.  Pt will continue to benefit from skilled PT to address balance, ankle and foot strength and foot pain.    Rehab Potential  Excellent    PT Frequency  2x / week    PT Duration  8 weeks    PT Treatment/Interventions  ADLs/Self Care Home Management;Electrical Stimulation;Cryotherapy;Moist Heat;Balance training;Therapeutic exercise;Therapeutic activities;Functional mobility training;Stair training;Gait training;Neuromuscular re-education;Patient/family education;Passive range of motion;Manual techniques;Taping    PT Next Visit Plan  work on balance, proprioception,strength, address foot pain    PT Home Exercise Plan  Access Code: [email or web address]    Recommended Other Services  initial cert is signed    Consulted and Agree with Plan of Care  Patient       Patient will benefit from skilled therapeutic intervention in order to improve the following deficits and impairments:  Abnormal gait, Decreased activity tolerance, Decreased balance, Decreased strength, Decreased endurance, Decreased range of motion, Difficulty walking, Impaired flexibility,  Pain  Visit Diagnosis: Pain in left ankle and joints of left foot  Pain in right ankle and joints of right foot  Other abnormalities of gait and mobility     Problem List Patient Active Problem List   Diagnosis Date Noted  . Greater trochanteric pain syndrome of left lower extremity 10/31/2017  . Retrocalcaneal bursitis (back of heel), left 10/31/2017  . Benign  essential HTN 09/27/2017  . Benign neoplasm of colon 09/27/2017  . Cannot sleep 09/27/2017  . Constipation by outlet dysfunction 09/27/2017  . Hematuria syndrome 09/27/2017  . HLD (hyperlipidemia) 09/27/2017  . Radiculitis 09/27/2017     Sigurd Sos, PT 07/17/19 10:19 AM  [facility] [address], [place], [place], [address] Phone: [phone]   Fax:  [phone]  Name: KHRYSTINE DRUSCHEL MRN: [id number] Date of Birth: 09-08-1940

## 2019-07-21 ENCOUNTER — Encounter: Payer: Medicare HMO | Admitting: Physical Therapy

## 2019-07-28 ENCOUNTER — Ambulatory Visit: Payer: Medicare HMO | Admitting: Physical Therapy

## 2019-07-28 ENCOUNTER — Encounter: Payer: Self-pay | Admitting: Physical Therapy

## 2019-07-28 ENCOUNTER — Other Ambulatory Visit: Payer: Self-pay

## 2019-07-28 DIAGNOSIS — R2689 Other abnormalities of gait and mobility: Secondary | ICD-10-CM

## 2019-07-28 DIAGNOSIS — M25572 Pain in left ankle and joints of left foot: Secondary | ICD-10-CM

## 2019-07-28 DIAGNOSIS — M25571 Pain in right ankle and joints of right foot: Secondary | ICD-10-CM

## 2019-07-28 NOTE — Therapy (Signed)
[facility] [address] [place], [place], [address] Phone: [phone]   Fax:  [phone]  Physical Therapy Treatment  Patient Details  Name: Jodi Cline MRN: [id number] Date of Birth: [birth year] Referring Provider (PT): Lavone Orn, MD   Encounter Date: 07/28/2019  PT End of Session - 07/28/19 0937    Visit Number  4    Date for PT Re-Evaluation  09/02/19    Authorization Type  Medicare    PT Start Time  0936    PT Stop Time  [id number]    PT Time Calculation (min)  39 min    Activity Tolerance  Patient tolerated treatment well    Behavior During Therapy  [facility] for tasks assessed/performed       Past Medical History:  Diagnosis Date  . BCC (basal cell carcinoma) 04/18/2005   Right mid thigh (CX35FU)  . BCC (basal cell carcinoma) 04/07/2009   right shoulder blade (Cx3)  . Hyperlipidemia   . Hypertension   . Insomnia   . Nodular basal cell carcinoma (BCC) 08/05/2013   right shoulder blade (EXC)  . SCC (squamous cell carcinoma) 02/19/2001   left arm (CX35FU)  . SCC (squamous cell carcinoma) 04/23/2013   left shin (txpbx)  . SCC (squamous cell carcinoma) 08/05/2013   left shin (txpbx)  . SCC (squamous cell carcinoma) 05/07/2014   Left shin (txpbx)  . SCC (squamous cell carcinoma) 08/04/2014   left shin (MOHS)  . Superficial basal cell carcinoma (BCC) 07/13/2015   right thigh (tx p bx)    Past Surgical History:  Procedure Laterality Date  . CESAREAN SECTION    . THORACIC SPINE SURGERY     Dr. Tonita Cong  . TUBAL LIGATION      There were no vitals filed for this visit.                    [facility] Adult PT Treatment/Exercise - 07/28/19 0001      Neuro Re-ed    Neuro Re-ed Details   Side stepping 78ft 2x of the following: side stepping, high knee marching, tandem walking.      Knee/Hip Exercises: Stretches   Active Hamstring Stretch  Right;Left;3 reps;20 seconds      Knee/Hip  Exercises: Standing   Walking with Sports Cord  20# forward 10x, CGA on cables for safety      Ankle Exercises: Standing   Rocker Board  --   20x,    Heel Raises  --   20x  like she was tapping her toes   Toe Raise  20 reps   No UE   Other Standing Ankle Exercises  Single leg step ups 10x bil onto blue pod,    Required i habd support to keep balance,     Ankle Exercises: Aerobic   Nustep  L1 x 7 min at end of sesison, PTA present to montitor d/t fatigue.                PT Short Term Goals - 07/17/19 0941      PT SHORT TERM GOAL #2   Title  report < or = to 5/10 bil foot pain with standing and walking    Status  Achieved      PT SHORT TERM GOAL #3   Title  demonstrate consistent heel strike with gait on level surface    Baseline  50% of the time    Time  4  Period  Weeks    Status  On-going        PT Long Term Goals - 07/08/19 1242      PT LONG TERM GOAL #1   Title  be independent in advanced HEP    Time  8    Period  Weeks    Status  New    Target Date  09/02/19      PT LONG TERM GOAL #2   Title  report a 60% reduction in bil foot pain with standing and walking    Time  8    Period  Weeks    Status  New    Target Date  09/02/19      PT LONG TERM GOAL #3   Title  reduce foot pain to walk for 25 minutes without limitation    Time  8    Period  Weeks    Status  New    Target Date  09/02/19      PT LONG TERM GOAL #4   Title  demonstrate consistent heel strike and improved trunk rotation with gait on level surface to improve safety    Time  8    Period  Weeks    Status  New    Target Date  09/02/19      PT LONG TERM GOAL #5   Title  perform alternating step-taps x 8 on 6" step in < or = to 8 seconds to reduce falls risk    Time  8    Period  Weeks    Status  New    Target Date  09/02/19      Additional Long Term Goals   Additional Long Term Goals  Yes      PT LONG TERM GOAL #6   Title  reduce FOTO to < or = to 34% limitation    Time  8     Period  Weeks    Status  New    Target Date  09/02/19            Plan - 07/28/19 1007    Clinical Impression Statement  Pt arrives reporting she has not been great about doin gher HEP over the holiday break. She reports she will "get back on the horse this week." Pt does report her feet have not given her any trouble over the last few weeks. She was able to perform her resisted walking today against more resistance and required at least one hand of support for her standing balance exercises. Pt was encouraged to practice tapping her toes more often to stimulate her anterior tibialis more often. Pt had difficulty with tandem walking balance wise, but there was no fall or significant LOB, just wobbliness.    Personal Factors and Comorbidities  Comorbidity 2    Comorbidities  bil foot DJD, balance deficits    Examination-Activity Limitations  Stand;Stairs;Squat;Transfers;Locomotion Level    Examination-Participation Restrictions  Community Activity;Laundry;Yard Work;Meal Prep    Stability/Clinical Decision Making  Evolving/Moderate complexity    Rehab Potential  Excellent    PT Frequency  2x / week    PT Duration  8 weeks    PT Treatment/Interventions  ADLs/Self Care Home Management;Electrical Stimulation;Cryotherapy;Moist Heat;Balance training;Therapeutic exercise;Therapeutic activities;Functional mobility training;Stair training;Gait training;Neuromuscular re-education;Patient/family education;Passive range of motion;Manual techniques;Taping    PT Next Visit Plan  work on balance, proprioception,strength, address foot pain    PT Home Exercise Plan  Access Code: [email or web address] and Agree with  Plan of Care  Patient       Patient will benefit from skilled therapeutic intervention in order to improve the following deficits and impairments:  Abnormal gait, Decreased activity tolerance, Decreased balance, Decreased strength, Decreased endurance, Decreased range of motion, Difficulty  walking, Impaired flexibility, Pain  Visit Diagnosis: Pain in left ankle and joints of left foot  Pain in right ankle and joints of right foot  Other abnormalities of gait and mobility     Problem List Patient Active Problem List   Diagnosis Date Noted  . Greater trochanteric pain syndrome of left lower extremity 10/31/2017  . Retrocalcaneal bursitis (back of heel), left 10/31/2017  . Benign essential HTN 09/27/2017  . Benign neoplasm of colon 09/27/2017  . Cannot sleep 09/27/2017  . Constipation by outlet dysfunction 09/27/2017  . Hematuria syndrome 09/27/2017  . HLD (hyperlipidemia) 09/27/2017  . Radiculitis 09/27/2017    Ayushi Pla, PTA 07/28/2019, 10:16 AM  [facility] [address], [place], [place], [address] Phone: [phone]   Fax:  [phone]  Name: Jodi Cline MRN: [id number] Date of Birth: 16-Jul-1941

## 2019-07-31 ENCOUNTER — Other Ambulatory Visit: Payer: Self-pay

## 2019-07-31 ENCOUNTER — Ambulatory Visit: Payer: Medicare HMO | Attending: Internal Medicine

## 2019-07-31 DIAGNOSIS — M25571 Pain in right ankle and joints of right foot: Secondary | ICD-10-CM | POA: Insufficient documentation

## 2019-07-31 DIAGNOSIS — R2689 Other abnormalities of gait and mobility: Secondary | ICD-10-CM | POA: Insufficient documentation

## 2019-07-31 DIAGNOSIS — M25572 Pain in left ankle and joints of left foot: Secondary | ICD-10-CM | POA: Insufficient documentation

## 2019-07-31 NOTE — Therapy (Signed)
[facility] [address] [place], [place], [address] Phone: [phone]   Fax:  [phone]  Physical Therapy Treatment  Patient Details  Name: Jodi Cline MRN: [id number] Date of Birth: 04-23-1941 Referring Provider (PT): Lavone Orn, MD   Encounter Date: 07/31/2019  PT End of Session - 07/31/19 1054    Visit Number  5    Date for PT Re-Evaluation  09/02/19    Authorization Type  Medicare    PT Start Time  1018    PT Stop Time  1058    PT Time Calculation (min)  40 min    Activity Tolerance  Patient tolerated treatment well    Behavior During Therapy  [facility] for tasks assessed/performed       Past Medical History:  Diagnosis Date  . BCC (basal cell carcinoma) 04/18/2005   Right mid thigh (CX35FU)  . BCC (basal cell carcinoma) 04/07/2009   right shoulder blade (Cx3)  . Hyperlipidemia   . Hypertension   . Insomnia   . Nodular basal cell carcinoma (BCC) 08/05/2013   right shoulder blade (EXC)  . SCC (squamous cell carcinoma) 02/19/2001   left arm (CX35FU)  . SCC (squamous cell carcinoma) 04/23/2013   left shin (txpbx)  . SCC (squamous cell carcinoma) 08/05/2013   left shin (txpbx)  . SCC (squamous cell carcinoma) 05/07/2014   Left shin (txpbx)  . SCC (squamous cell carcinoma) 08/04/2014   left shin (MOHS)  . Superficial basal cell carcinoma (BCC) 07/13/2015   right thigh (tx p bx)    Past Surgical History:  Procedure Laterality Date  . CESAREAN SECTION    . THORACIC SPINE SURGERY     Dr. Tonita Cong  . TUBAL LIGATION      There were no vitals filed for this visit.  Subjective Assessment - 07/31/19 1018    Subjective  I am doing well.  I am doing my exercises.    Currently in Pain?  No/denies                       [facility] Adult PT Treatment/Exercise - 07/31/19 0001      Neuro Re-ed    Neuro Re-ed Details   Side stepping 54ft 2x of the following: side stepping, high knee marching,  tandem walking. at counter top      Knee/Hip Exercises: Stretches   Active Hamstring Stretch  Right;Left;3 reps;20 seconds      Knee/Hip Exercises: Standing   Rocker Board  3 minutes    Walking with Sports Cord  20# forward 10x, CGA on cables for safety, reverse 20# x 8      Knee/Hip Exercises: Seated   Sit to Sand  2 sets;10 reps;without UE support   verbal cues for hip alignment     Ankle Exercises: Aerobic   Nustep  L1 x 8 min at end of sesison, PT present to monitor               PT Short Term Goals - 07/31/19 1028      PT SHORT TERM GOAL #2   Title  report < or = to 5/10 bil foot pain with standing and walking    Status  Achieved      PT SHORT TERM GOAL #3   Title  demonstrate consistent heel strike with gait on level surface    Status  Achieved        PT Long Term Goals - 07/31/19  [place] #1   Title  be independent in advanced HEP    Time  8    Period  Weeks    Status  On-going      PT LONG TERM GOAL #2   Title  report a 60% reduction in bil foot pain with standing and walking    Baseline  20% improvement    Time  8    Period  Weeks    Status  On-going      PT LONG TERM GOAL #3   Title  reduce foot pain to walk for 25 minutes without limitation    Time  8    Period  Weeks    Status  On-going            Plan - 07/31/19 1038    Clinical Impression Statement  Pt reports 20% improvement in bil foot pain and tingling.  Pt demonstrates improved heel strike and is now able to walk >20 minutes without need for rest.  Pt is able to participate in dynamic balance activities at the counter and is challenged by this.  Pt requires supervision and stand by assistance for safety.  Pt requires verbal cues for hip alignment with sit to stand.  Pt will continue to benefit from skilled PT to address balance, strength and balance.    PT Frequency  2x / week    PT Duration  8 weeks    PT Treatment/Interventions  ADLs/Self Care Home  Management;Electrical Stimulation;Cryotherapy;Moist Heat;Balance training;Therapeutic exercise;Therapeutic activities;Functional mobility training;Stair training;Gait training;Neuromuscular re-education;Patient/family education;Passive range of motion;Manual techniques;Taping    PT Next Visit Plan  work on balance, proprioception,strength, address foot pain if needed    PT Home Exercise Plan  Access Code: [email or web address]    Consulted and Agree with Plan of Care  Patient       Patient will benefit from skilled therapeutic intervention in order to improve the following deficits and impairments:  Abnormal gait, Decreased activity tolerance, Decreased balance, Decreased strength, Decreased endurance, Decreased range of motion, Difficulty walking, Impaired flexibility, Pain  Visit Diagnosis: Other abnormalities of gait and mobility  Pain in right ankle and joints of right foot  Pain in left ankle and joints of left foot     Problem List Patient Active Problem List   Diagnosis Date Noted  . Greater trochanteric pain syndrome of left lower extremity 10/31/2017  . Retrocalcaneal bursitis (back of heel), left 10/31/2017  . Benign essential HTN 09/27/2017  . Benign neoplasm of colon 09/27/2017  . Cannot sleep 09/27/2017  . Constipation by outlet dysfunction 09/27/2017  . Hematuria syndrome 09/27/2017  . HLD (hyperlipidemia) 09/27/2017  . Radiculitis 09/27/2017    Sigurd Sos, PT 07/31/19 10:55 AM  [facility] [address] [place], [place], [address] Phone: [phone]   Fax:  [phone]  Name: DERIONA GALATI MRN: [id number] Date of Birth: 1940-10-01

## 2019-08-04 ENCOUNTER — Ambulatory Visit: Payer: Medicare HMO | Admitting: Physical Therapy

## 2019-08-04 ENCOUNTER — Other Ambulatory Visit: Payer: Self-pay

## 2019-08-04 ENCOUNTER — Encounter: Payer: Self-pay | Admitting: Physical Therapy

## 2019-08-04 DIAGNOSIS — M25572 Pain in left ankle and joints of left foot: Secondary | ICD-10-CM

## 2019-08-04 DIAGNOSIS — R2689 Other abnormalities of gait and mobility: Secondary | ICD-10-CM | POA: Diagnosis not present

## 2019-08-04 DIAGNOSIS — M25571 Pain in right ankle and joints of right foot: Secondary | ICD-10-CM

## 2019-08-04 NOTE — Therapy (Signed)
[facility] [address] [place], [place], [address] Phone: [phone]   Fax:  [phone]  Physical Therapy Treatment  Patient Details  Name: Jodi Cline MRN: [id number] Date of Birth: Mar 25, 1941 Referring Provider (PT): Lavone Orn, MD   Encounter Date: 08/04/2019  PT End of Session - 08/04/19 1019    Visit Number  6    Date for PT Re-Evaluation  09/02/19    Authorization Type  Medicare    PT Start Time  [id number]    PT Stop Time  1055    PT Time Calculation (min)  40 min    Activity Tolerance  Patient tolerated treatment well    Behavior During Therapy  [facility] for tasks assessed/performed       Past Medical History:  Diagnosis Date  . BCC (basal cell carcinoma) 04/18/2005   Right mid thigh (CX35FU)  . BCC (basal cell carcinoma) 04/07/2009   right shoulder blade (Cx3)  . Hyperlipidemia   . Hypertension   . Insomnia   . Nodular basal cell carcinoma (BCC) 08/05/2013   right shoulder blade (EXC)  . SCC (squamous cell carcinoma) 02/19/2001   left arm (CX35FU)  . SCC (squamous cell carcinoma) 04/23/2013   left shin (txpbx)  . SCC (squamous cell carcinoma) 08/05/2013   left shin (txpbx)  . SCC (squamous cell carcinoma) 05/07/2014   Left shin (txpbx)  . SCC (squamous cell carcinoma) 08/04/2014   left shin (MOHS)  . Superficial basal cell carcinoma (BCC) 07/13/2015   right thigh (tx p bx)    Past Surgical History:  Procedure Laterality Date  . CESAREAN SECTION    . THORACIC SPINE SURGERY     Dr. Tonita Cong  . TUBAL LIGATION      There were no vitals filed for this visit.  Subjective Assessment - 08/04/19 1017    Subjective  Had some bowel issues this AM and this seems to effect my day including my balance.    Currently in Pain?  No/denies                       [facility] Adult PT Treatment/Exercise - 08/04/19 0001      Knee/Hip Exercises: Stretches   Gastroc Stretch  Left;3 reps;20 seconds     Gastroc Stretch Limitations  added to HEP      Knee/Hip Exercises: Standing   Rocker Board  3 minutes    Walking with Sports Cord  20# forward 10x, CGA on cables for safety, reverse 20# x 10    Other Standing Knee Exercises  Alt toe taps on step x 1 min no UE      Ankle Exercises: Aerobic   Nustep  L1 x 10 min with discussion of status      Ankle Exercises: Seated   Other Seated Ankle Exercises  Rolling bottom of feet with 3 different balls: soft to firm 1 min each              PT Education - 08/04/19 1058    Education Details  Standing gastroc stretch for HEP LT>RT    Person(s) Educated  Patient    Methods  Explanation;Demonstration;Verbal cues;Handout    Comprehension  Returned demonstration;Verbalized understanding       PT Short Term Goals - 07/31/19 1028      PT SHORT TERM GOAL #2   Title  report < or = to 5/10 bil foot pain with standing and walking  Status  Achieved      PT SHORT TERM GOAL #3   Title  demonstrate consistent heel strike with gait on level surface    Status  Achieved        PT Long Term Goals - 07/31/19 1028      PT LONG TERM GOAL #1   Title  be independent in advanced HEP    Time  8    Period  Weeks    Status  On-going      PT LONG TERM GOAL #2   Title  report a 60% reduction in bil foot pain with standing and walking    Baseline  20% improvement    Time  8    Period  Weeks    Status  On-going      PT LONG TERM GOAL #3   Title  reduce foot pain to walk for 25 minutes without limitation    Time  8    Period  Weeks    Status  On-going            Plan - 08/04/19 1019    Clinical Impression Statement  Pt reports she continues to do well, little complinats of pain in her feet. She started to have some pain in her Lt arch as we finished the resisted walking. This was abolished with rolling a firm ball under her feet. Pt's Lt gastroc is tighter than the RT. We added gastroc stretch in standing to HEP. The Lt foot is the side  she tends to "trip over."    Personal Factors and Comorbidities  Comorbidity 2    Comorbidities  bil foot DJD, balance deficits    Examination-Activity Limitations  Stand;Stairs;Squat;Transfers;Locomotion Level    Examination-Participation Restrictions  Community Activity;Laundry;Yard Work;Meal Prep    Stability/Clinical Decision Making  Evolving/Moderate complexity    Rehab Potential  Excellent    PT Frequency  2x / week    PT Duration  8 weeks    PT Treatment/Interventions  ADLs/Self Care Home Management;Electrical Stimulation;Cryotherapy;Moist Heat;Balance training;Therapeutic exercise;Therapeutic activities;Functional mobility training;Stair training;Gait training;Neuromuscular re-education;Patient/family education;Passive range of motion;Manual techniques;Taping    PT Next Visit Plan  work on balance, proprioception,strength, address foot pain if needed    PT Home Exercise Plan  Access Code: [email or web address]    Consulted and Agree with Plan of Care  Patient       Patient will benefit from skilled therapeutic intervention in order to improve the following deficits and impairments:  Abnormal gait, Decreased activity tolerance, Decreased balance, Decreased strength, Decreased endurance, Decreased range of motion, Difficulty walking, Impaired flexibility, Pain  Visit Diagnosis: Other abnormalities of gait and mobility  Pain in right ankle and joints of right foot  Pain in left ankle and joints of left foot     Problem List Patient Active Problem List   Diagnosis Date Noted  . Greater trochanteric pain syndrome of left lower extremity 10/31/2017  . Retrocalcaneal bursitis (back of heel), left 10/31/2017  . Benign essential HTN 09/27/2017  . Benign neoplasm of colon 09/27/2017  . Cannot sleep 09/27/2017  . Constipation by outlet dysfunction 09/27/2017  . Hematuria syndrome 09/27/2017  . HLD (hyperlipidemia) 09/27/2017  . Radiculitis 09/27/2017    COCHRAN,JENNIFER, PTA 08/04/2019,  10:58 AM  [facility] [address], [place], [place], [address] Phone: [phone]   Fax:  [phone]  Name: Jodi Cline MRN: [id number] Date of Birth: [birth year]  Access Code: [email or web address]  URL: [email or web address]  Date: 08/04/2019  Prepared by: Myrene Galas   Exercises  Long Sitting Calf Stretch with Strap - 5 reps - 1 sets - 10 hold - 3x daily - 7x weekly  Seated Toe Raise - 10 reps - 1 sets - 5x daily - 7x weekly  Heel Raise - 10 reps - 2 sets - 2x daily - 7x weekly  Sit to Stand without Arm Support - 10 reps - 2 sets - 2x daily - 7x weekly  Seated Ankle Circles - 10 reps - 2 sets - 3x daily - 7x weekly  Seated Hamstring Stretch - 2 reps - 1 sets - 30 hold - 2x daily - 7x weekly  Seated Ankle Eversion with Resistance - 10 reps - 3 hold - 1x daily - 7x weekly  Standing Gastroc Stretch - 1 reps - 3 sets - 20 hold - 1x daily - 7x weekly

## 2019-08-07 ENCOUNTER — Other Ambulatory Visit: Payer: Self-pay

## 2019-08-07 ENCOUNTER — Ambulatory Visit: Payer: Medicare HMO

## 2019-08-07 DIAGNOSIS — M25571 Pain in right ankle and joints of right foot: Secondary | ICD-10-CM | POA: Diagnosis not present

## 2019-08-07 DIAGNOSIS — R2689 Other abnormalities of gait and mobility: Secondary | ICD-10-CM | POA: Diagnosis not present

## 2019-08-07 DIAGNOSIS — M25572 Pain in left ankle and joints of left foot: Secondary | ICD-10-CM | POA: Diagnosis not present

## 2019-08-07 NOTE — Therapy (Signed)
[facility] [address] [place], [place], [address] Phone: [phone]   Fax:  [phone]  Physical Therapy Treatment  Patient Details  Name: Jodi Cline MRN: [id number] Date of Birth: 1940-10-26 Referring Provider (PT): Lavone Orn, MD   Encounter Date: 08/07/2019  PT End of Session - 08/07/19 1058    Visit Number  7    Date for PT Re-Evaluation  09/02/19    Authorization Type  Medicare    PT Start Time  1017    PT Stop Time  1058    PT Time Calculation (min)  41 min    Activity Tolerance  Patient tolerated treatment well    Behavior During Therapy  [facility] for tasks assessed/performed       Past Medical History:  Diagnosis Date  . BCC (basal cell carcinoma) 04/18/2005   Right mid thigh (CX35FU)  . BCC (basal cell carcinoma) 04/07/2009   right shoulder blade (Cx3)  . Hyperlipidemia   . Hypertension   . Insomnia   . Nodular basal cell carcinoma (BCC) 08/05/2013   right shoulder blade (EXC)  . SCC (squamous cell carcinoma) 02/19/2001   left arm (CX35FU)  . SCC (squamous cell carcinoma) 04/23/2013   left shin (txpbx)  . SCC (squamous cell carcinoma) 08/05/2013   left shin (txpbx)  . SCC (squamous cell carcinoma) 05/07/2014   Left shin (txpbx)  . SCC (squamous cell carcinoma) 08/04/2014   left shin (MOHS)  . Superficial basal cell carcinoma (BCC) 07/13/2015   right thigh (tx p bx)    Past Surgical History:  Procedure Laterality Date  . CESAREAN SECTION    . THORACIC SPINE SURGERY     Dr. Tonita Cong  . TUBAL LIGATION      There were no vitals filed for this visit.  Subjective Assessment - 08/07/19 1021    Subjective  My feet were sore after I was here last session.  I went to get a pedicure after this and that might have been what bothered me.                       [facility] Adult PT Treatment/Exercise - 08/07/19 0001      Knee/Hip Exercises: Stretches   Active Hamstring Stretch   Right;Left;3 reps;20 seconds      Knee/Hip Exercises: Standing   Rocker Board  3 minutes    Walking with Sports Cord  20# forward 10x, CGA on cables for safety, reverse 20# x 10    Other Standing Knee Exercises  Alt toe taps on step 3x10- standing on black balance pad      Knee/Hip Exercises: Seated   Sit to Sand  2 sets;10 reps;without UE support      Ankle Exercises: Standing   Other Standing Ankle Exercises  tandem stance: on black balance pad 2x 10 seconds Rt and Lt      Ankle Exercises: Aerobic   Nustep  L2 x 10 min with discussion of status               PT Short Term Goals - 08/07/19 1032      PT SHORT TERM GOAL #3   Title  demonstrate consistent heel strike with gait on level surface    Status  Achieved        PT Long Term Goals - 07/31/19 1028      PT LONG TERM GOAL #1   Title  be independent in advanced HEP  Time  8    Period  Weeks    Status  On-going      PT LONG TERM GOAL #2   Title  report a 60% reduction in bil foot pain with standing and walking    Baseline  20% improvement    Time  8    Period  Weeks    Status  On-going      PT LONG TERM GOAL #3   Title  reduce foot pain to walk for 25 minutes without limitation    Time  8    Period  Weeks    Status  On-going            Plan - 08/07/19 1034    Clinical Impression Statement  Pt continues to work on consistent heel strike on level surface at home and in the community.  Pt is challenged with resisted walking and has difficulty performing consistent heel strike with this.  Pt requires stand by assistance and contact guard assistance with standing balance exercises.  Pt has not had foot pain over the past 2 weeks and did have 1 day of pain 2 days ago after getting a pedicure.  Pt with balance deficits and will continue to benefit from skilled PT to address strength, balance and endurance to improve safety.    PT Frequency  2x / week    PT Duration  8 weeks    PT Treatment/Interventions   ADLs/Self Care Home Management;Electrical Stimulation;Cryotherapy;Moist Heat;Balance training;Therapeutic exercise;Therapeutic activities;Functional mobility training;Stair training;Gait training;Neuromuscular re-education;Patient/family education;Passive range of motion;Manual techniques;Taping    PT Next Visit Plan  work on balance, proprioception,strength, address foot pain if needed    PT Home Exercise Plan  Access Code: [email or web address]    Consulted and Agree with Plan of Care  Patient       Patient will benefit from skilled therapeutic intervention in order to improve the following deficits and impairments:  Abnormal gait, Decreased activity tolerance, Decreased balance, Decreased strength, Decreased endurance, Decreased range of motion, Difficulty walking, Impaired flexibility, Pain  Visit Diagnosis: Pain in right ankle and joints of right foot  Other abnormalities of gait and mobility  Pain in left ankle and joints of left foot     Problem List Patient Active Problem List   Diagnosis Date Noted  . Greater trochanteric pain syndrome of left lower extremity 10/31/2017  . Retrocalcaneal bursitis (back of heel), left 10/31/2017  . Benign essential HTN 09/27/2017  . Benign neoplasm of colon 09/27/2017  . Cannot sleep 09/27/2017  . Constipation by outlet dysfunction 09/27/2017  . Hematuria syndrome 09/27/2017  . HLD (hyperlipidemia) 09/27/2017  . Radiculitis 09/27/2017    Sigurd Sos, PT 08/07/19 11:01 AM  [facility] [address], [place], [place], [address] Phone: [phone]   Fax:  [phone]  Name: DACIE WIND MRN: [id number] Date of Birth: 04-06-1941

## 2019-08-11 ENCOUNTER — Encounter: Payer: Self-pay | Admitting: Physical Therapy

## 2019-08-11 ENCOUNTER — Other Ambulatory Visit: Payer: Self-pay

## 2019-08-11 ENCOUNTER — Ambulatory Visit: Payer: Medicare HMO | Admitting: Physical Therapy

## 2019-08-11 DIAGNOSIS — M25572 Pain in left ankle and joints of left foot: Secondary | ICD-10-CM

## 2019-08-11 DIAGNOSIS — M25571 Pain in right ankle and joints of right foot: Secondary | ICD-10-CM | POA: Diagnosis not present

## 2019-08-11 DIAGNOSIS — R2689 Other abnormalities of gait and mobility: Secondary | ICD-10-CM

## 2019-08-11 NOTE — Therapy (Signed)
[facility] [address] [place], [place], [address] Phone: [phone]   Fax:  [phone]  Physical Therapy Treatment  Patient Details  Name: Jodi Cline MRN: [id number] Date of Birth: [birth year] Referring Provider (PT): Lavone Orn, MD   Encounter Date: 08/11/2019  PT End of Session - 08/11/19 1017    Visit Number  8    Date for PT Re-Evaluation  09/02/19    Authorization Type  Medicare    PT Start Time  1016    PT Stop Time  1054    PT Time Calculation (min)  38 min    Activity Tolerance  Patient tolerated treatment well    Behavior During Therapy  [facility] for tasks assessed/performed       Past Medical History:  Diagnosis Date  . BCC (basal cell carcinoma) 04/18/2005   Right mid thigh (CX35FU)  . BCC (basal cell carcinoma) 04/07/2009   right shoulder blade (Cx3)  . Hyperlipidemia   . Hypertension   . Insomnia   . Nodular basal cell carcinoma (BCC) 08/05/2013   right shoulder blade (EXC)  . SCC (squamous cell carcinoma) 02/19/2001   left arm (CX35FU)  . SCC (squamous cell carcinoma) 04/23/2013   left shin (txpbx)  . SCC (squamous cell carcinoma) 08/05/2013   left shin (txpbx)  . SCC (squamous cell carcinoma) 05/07/2014   Left shin (txpbx)  . SCC (squamous cell carcinoma) 08/04/2014   left shin (MOHS)  . Superficial basal cell carcinoma (BCC) 07/13/2015   right thigh (tx p bx)    Past Surgical History:  Procedure Laterality Date  . CESAREAN SECTION    . THORACIC SPINE SURGERY     Dr. Tonita Cong  . TUBAL LIGATION      There were no vitals filed for this visit.  Subjective Assessment - 08/11/19 1018    Subjective  No complaints this AM.    Pertinent History  HTN, cholesterol, skin cancer    Limitations  Standing;Walking    Currently in Pain?  No/denies                       [facility] Adult PT Treatment/Exercise - 08/11/19 0001      Knee/Hip Exercises: Stretches   Active  Hamstring Stretch  Right;Left;1 rep;30 seconds    Gastroc Stretch  Both;2 reps;30 seconds    Gastroc Stretch Limitations  slant board      Knee/Hip Exercises: Machines for Strengthening   Cybex Leg Press  Seat 7: BilLE 50# 2x 10      Knee/Hip Exercises: Standing   Walking with Sports Cord  20# forward 10x, CGA on cables for safety, reverse 20# x 10   VC to take bigger steps in reverse   Other Standing Knee Exercises  Alt toe taps on step 3x10- standing on black balance pad      Ankle Exercises: Aerobic   Nustep  L2 x 10 min at end of session               PT Short Term Goals - 08/07/19 1032      PT SHORT TERM GOAL #3   Title  demonstrate consistent heel strike with gait on level surface    Status  Achieved        PT Long Term Goals - 07/31/19 1028      PT LONG TERM GOAL #1   Title  be independent in advanced HEP    Time  8    Period  Weeks    Status  On-going      PT LONG TERM GOAL #2   Title  report a 60% reduction in bil foot pain with standing and walking    Baseline  20% improvement    Time  8    Period  Weeks    Status  On-going      PT LONG TERM GOAL #3   Title  reduce foot pain to walk for 25 minutes without limitation    Time  8    Period  Weeks    Status  On-going            Plan - 08/11/19 1018    Clinical Impression Statement  Pt arrives today pain free, had no pain over the weekned. Most difficulty with her balance is walking backwards with resistance. She wil lrevert back to her small steps and is a bit reluctant to take bigger steps. Pt requires some UE support on the hand rails for balance. Standing on the balance pad was challenging.    Personal Factors and Comorbidities  Comorbidity 2    Comorbidities  bil foot DJD, balance deficits    Examination-Participation Restrictions  Community Activity;Laundry;Yard Work;Meal Prep    Stability/Clinical Decision Making  Evolving/Moderate complexity    Rehab Potential  Excellent    PT  Frequency  2x / week    PT Duration  8 weeks    PT Treatment/Interventions  ADLs/Self Care Home Management;Electrical Stimulation;Cryotherapy;Moist Heat;Balance training;Therapeutic exercise;Therapeutic activities;Functional mobility training;Stair training;Gait training;Neuromuscular re-education;Patient/family education;Passive range of motion;Manual techniques;Taping    PT Next Visit Plan  work on balance, proprioception,strength, address foot pain if needed    PT Home Exercise Plan  Access Code: [email or web address]    Consulted and Agree with Plan of Care  Patient       Patient will benefit from skilled therapeutic intervention in order to improve the following deficits and impairments:  Abnormal gait, Decreased activity tolerance, Decreased balance, Decreased strength, Decreased endurance, Decreased range of motion, Difficulty walking, Impaired flexibility, Pain  Visit Diagnosis: Pain in right ankle and joints of right foot  Other abnormalities of gait and mobility  Pain in left ankle and joints of left foot     Problem List Patient Active Problem List   Diagnosis Date Noted  . Greater trochanteric pain syndrome of left lower extremity 10/31/2017  . Retrocalcaneal bursitis (back of heel), left 10/31/2017  . Benign essential HTN 09/27/2017  . Benign neoplasm of colon 09/27/2017  . Cannot sleep 09/27/2017  . Constipation by outlet dysfunction 09/27/2017  . Hematuria syndrome 09/27/2017  . HLD (hyperlipidemia) 09/27/2017  . Radiculitis 09/27/2017    Chantia Amalfitano, PTA 08/11/2019, 10:54 AM  [facility] [address] [place], [place], [address] Phone: [phone]   Fax:  [phone]  Name: Jodi Cline MRN: [id number] Date of Birth: 24-Mar-1941

## 2019-08-14 ENCOUNTER — Ambulatory Visit: Payer: Medicare HMO

## 2019-08-29 DIAGNOSIS — I447 Left bundle-branch block, unspecified: Secondary | ICD-10-CM

## 2019-08-29 HISTORY — DX: Left bundle-branch block, unspecified: I44.7

## 2019-09-02 ENCOUNTER — Ambulatory Visit: Payer: Medicare HMO | Attending: Internal Medicine

## 2019-09-02 ENCOUNTER — Other Ambulatory Visit: Payer: Self-pay

## 2019-09-02 DIAGNOSIS — M25572 Pain in left ankle and joints of left foot: Secondary | ICD-10-CM | POA: Insufficient documentation

## 2019-09-02 DIAGNOSIS — M25571 Pain in right ankle and joints of right foot: Secondary | ICD-10-CM | POA: Insufficient documentation

## 2019-09-02 DIAGNOSIS — R2689 Other abnormalities of gait and mobility: Secondary | ICD-10-CM

## 2019-09-02 NOTE — Therapy (Signed)
[facility] [address] [place], [place], [address] Phone: [phone]   Fax:  [phone]  Physical Therapy Treatment  Patient Details  Name: Jodi Cline MRN: [id number] Date of Birth: 1940-09-18 Referring Provider (PT): Lavone Orn, MD   Encounter Date: 09/02/2019  PT End of Session - 09/02/19 1051    Visit Number  9    Date for PT Re-Evaluation  10/14/19    Authorization Type  Medicare    PT Start Time  1017    PT Stop Time  1059    PT Time Calculation (min)  42 min    Activity Tolerance  Patient tolerated treatment well    Behavior During Therapy  [facility] for tasks assessed/performed       Past Medical History:  Diagnosis Date  . BCC (basal cell carcinoma) 04/18/2005   Right mid thigh (CX35FU)  . BCC (basal cell carcinoma) 04/07/2009   right shoulder blade (Cx3)  . Hyperlipidemia   . Hypertension   . Insomnia   . Nodular basal cell carcinoma (BCC) 08/05/2013   right shoulder blade (EXC)  . SCC (squamous cell carcinoma) 02/19/2001   left arm (CX35FU)  . SCC (squamous cell carcinoma) 04/23/2013   left shin (txpbx)  . SCC (squamous cell carcinoma) 08/05/2013   left shin (txpbx)  . SCC (squamous cell carcinoma) 05/07/2014   Left shin (txpbx)  . SCC (squamous cell carcinoma) 08/04/2014   left shin (MOHS)  . Superficial basal cell carcinoma (BCC) 07/13/2015   right thigh (tx p bx)    Past Surgical History:  Procedure Laterality Date  . CESAREAN SECTION    . THORACIC SPINE SURGERY     Dr. Tonita Cong  . TUBAL LIGATION      There were no vitals filed for this visit.      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   Overall Cognitive Status  Within Functional Limits for tasks assessed      Observation/Other Assessments   Focus on Therapeutic Outcomes (FOTO)   41% limitation      Transfers   Transfers  Stand to Sit;Sit to Stand;Independent with all Transfers    Five time sit to stand comments   12.72 seconds    Stand to Sit  7: Independent    Comments  alternating step taps on 6" step 9.32 seconds                   [facility] Adult PT Treatment/Exercise - 09/02/19 0001      Knee/Hip Exercises: Aerobic   Nustep  Level 2x 10 minutes   PT present to discuss progress     Knee/Hip Exercises: Standing   Hip Abduction  Stengthening;Both;2 sets;10 reps;Knee straight    Abduction Limitations  2#    Hip Extension  Stengthening;Both;2 sets;10 reps;Knee straight    Extension Limitations  2#      Knee/Hip  Exercises: Seated   Long Arc Quad  Strengthening;2 sets;10 reps;Weights    Long Arc Quad Weight  2 lbs.    Marching  Strengthening;2 sets;10 reps;Weights    Marching Weights  2 lbs.    Sit to Sand  2 sets;10 reps;without UE support               PT Short Term Goals - 09/02/19 1020      PT SHORT TERM GOAL #2   Title  report < or = to 5/10 bil foot pain with standing and walking    Status  Achieved      PT SHORT TERM GOAL #3   Title  demonstrate consistent heel strike with gait on level surface        PT Long Term Goals - 09/02/19 1020      PT LONG TERM GOAL #1   Title  be independent in advanced HEP    Time  6    Period  Weeks    Status  On-going    Target Date  10/14/19      PT LONG TERM GOAL #2   Title  report a 75% reduction in bil foot pain with standing and walking    Baseline  60% reduction    Time  6    Status  Revised    Target Date  10/14/19      PT LONG TERM GOAL #3   Title  reduce foot pain to walk for 25 minutes without limitation    Baseline   unsure- not able to walk    Time  6    Period  Weeks    Status  On-going    Target Date  10/14/19      PT LONG TERM GOAL #4   Title  demonstrate consistent heel strike and improved trunk rotation with gait on level surface to improve safety    Baseline  inconsistent due to new onset of bil toe pain    Time  6    Status  On-going    Target Date  10/14/19      PT LONG TERM GOAL #5   Title  perform alternating step-taps x 8 on 6" step in < or = to 8 seconds to reduce falls risk    Baseline  9.32 seconds    Time  6    Period  Weeks    Status  On-going    Target Date  10/14/19      PT LONG TERM GOAL #6   Title  reduce FOTO to < or = to 34% limitation    Time  6    Period  Weeks    Status  On-going    Target Date  10/14/19            Plan - 09/02/19 1049    Clinical Impression Statement  Pt presents with bil. Great toe pain that began ~2 weeks ago. Pt has not been able to exercise as much due to this pain.  Pt reports a 60% reduction in original foot pain that she presented with.  Pt with improved time with 6" alternating taps indicating a reduced risk of falls.  Pt is more consistent with heel strike and is demonstrating this ~50% of the time. Pt with new onset of great toe pain and this has limited her ability to perform standing exercise and walk long distances.  Pt will continue to benefit from skilled PT to address gait, strength, balance  and endurance to improve safety and function at home and in the community.    PT Frequency  2x / week    PT Duration  6 weeks    PT Treatment/Interventions  ADLs/Self Care Home Management;Electrical Stimulation;Cryotherapy;Moist Heat;Balance training;Therapeutic exercise;Therapeutic activities;Functional mobility training;Stair training;Gait training;Neuromuscular re-education;Patient/family education;Passive range of motion;Manual techniques;Taping    PT Next Visit Plan  work on balance, proprioception,strength, address foot pain if needed.   See what MD says about new onset of foot pain    PT Home Exercise Plan  Access Code: [email or web address]    Recommended Other Services  recert sent [id number]    Consulted and Agree with Plan of Care  Patient       Patient will benefit from skilled therapeutic intervention in order to improve the following deficits and impairments:  Abnormal gait, Decreased activity tolerance, Decreased balance, Decreased strength, Decreased endurance, Decreased range of motion, Difficulty walking, Impaired flexibility, Pain  Visit Diagnosis: Pain in right ankle and joints of right foot - Plan: PT plan of care cert/re-cert  Other abnormalities of gait and mobility - Plan: PT plan of care cert/re-cert  Pain in left ankle and joints of left foot - Plan: PT plan of care cert/re-cert     Problem List Patient Active Problem List   Diagnosis Date Noted  . Greater trochanteric pain syndrome of left lower extremity 10/31/2017  . Retrocalcaneal bursitis (back of heel), left 10/31/2017  . Benign essential HTN 09/27/2017  . Benign neoplasm of colon 09/27/2017  . Cannot sleep 09/27/2017  . Constipation by outlet dysfunction 09/27/2017  . Hematuria syndrome 09/27/2017  . HLD (hyperlipidemia) 09/27/2017  . Radiculitis 09/27/2017     Sigurd Sos, PT 09/02/19 10:58 AM  [facility] [address], [place], [place], [address] Phone: [phone]   Fax:  [phone]  Name: TAYONA BECKERT MRN: [id number] Date of Birth: 1941-07-18

## 2019-09-04 ENCOUNTER — Telehealth: Payer: Self-pay | Admitting: *Deleted

## 2019-09-04 ENCOUNTER — Ambulatory Visit: Payer: Medicare HMO | Admitting: Podiatry

## 2019-09-04 ENCOUNTER — Encounter: Payer: Self-pay | Admitting: Podiatry

## 2019-09-04 ENCOUNTER — Other Ambulatory Visit: Payer: Self-pay

## 2019-09-04 DIAGNOSIS — I999 Unspecified disorder of circulatory system: Secondary | ICD-10-CM

## 2019-09-04 DIAGNOSIS — L600 Ingrowing nail: Secondary | ICD-10-CM

## 2019-09-04 NOTE — Telephone Encounter (Signed)
Dr. Paulla Dolly ordered ABI with TBI and arterial dopplers for nonpalpable pulses. Faxed to [facility].

## 2019-09-05 NOTE — Progress Notes (Signed)
Subjective:   Patient ID: Jodi Cline, female   DOB: 79 y.o.   MRN: [id number]   HPI Patient presents stating she is having a lot of pain around her big toenail left and even though it is feeling some better right now is quite sore.  Also seems to have a lot of vague pain in her left over right leg and states she thinks she might of had vascular studies but it does not sound like she had arterial studies.  Patient did smoke and is not currently active   Review of Systems  All other systems reviewed and are negative.       Objective:  Physical Exam Vitals and nursing note reviewed.  Constitutional:      Appearance: She is well-developed.  Pulmonary:     Effort: Pulmonary effort is normal.  Musculoskeletal:        General: Normal range of motion.  Skin:    General: Skin is warm.  Neurological:     Mental Status: She is alert.     Neurovascular status was found to be diminished with diminished PT DP pulses left over right.  Diminished cap fill time noted bilateral with tenderness around the left hallux nail but I did not note any drainage or excessive redness.  There is structural changes within the nailbed itself and she has diminished hair growth noted and white type tissue     Assessment:  Probability for vascular disease left over right with leg pain and what appears to be claudication symptomatology     Plan:  H&P conditions reviewed and I discussed treatment options.  Due to the longstanding nature I do want her to see a vascular doctor after having ABIs with possibility for revascularization be necessary.  I carefully trim nails do not recommend other treatments for this at the current time

## 2019-09-09 ENCOUNTER — Ambulatory Visit: Payer: Medicare HMO

## 2019-09-09 ENCOUNTER — Other Ambulatory Visit: Payer: Self-pay

## 2019-09-09 DIAGNOSIS — M25572 Pain in left ankle and joints of left foot: Secondary | ICD-10-CM

## 2019-09-09 DIAGNOSIS — M25571 Pain in right ankle and joints of right foot: Secondary | ICD-10-CM | POA: Diagnosis not present

## 2019-09-09 DIAGNOSIS — R2689 Other abnormalities of gait and mobility: Secondary | ICD-10-CM | POA: Diagnosis not present

## 2019-09-09 NOTE — Therapy (Signed)
[facility] [address] [place], [place], [address] Phone: [phone]   Fax:  [phone]  Physical Therapy Treatment  Patient Details  Name: Jodi Cline MRN: [id number] Date of Birth: [birth year] Referring Provider (PT): Lavone Orn, MD   Encounter Date: 09/09/2019 Progress Note Reporting Period 07/08/2019  to 09/09/2019  See note below for Objective Data and Assessment of Progress/Goals.      PT End of Session - 09/09/19 1145    Visit Number  10    Date for PT Re-Evaluation  10/14/19    Authorization Type  Medicare    PT Start Time  1103    PT Stop Time  1144    PT Time Calculation (min)  41 min    Activity Tolerance  Patient tolerated treatment well    Behavior During Therapy  [facility] for tasks assessed/performed       Past Medical History:  Diagnosis Date  . BCC (basal cell carcinoma) 04/18/2005   Right mid thigh (CX35FU)  . BCC (basal cell carcinoma) 04/07/2009   right shoulder blade (Cx3)  . Hyperlipidemia   . Hypertension   . Insomnia   . Nodular basal cell carcinoma (BCC) 08/05/2013   right shoulder blade (EXC)  . SCC (squamous cell carcinoma) 02/19/2001   left arm (CX35FU)  . SCC (squamous cell carcinoma) 04/23/2013   left shin (txpbx)  . SCC (squamous cell carcinoma) 08/05/2013   left shin (txpbx)  . SCC (squamous cell carcinoma) 05/07/2014   Left shin (txpbx)  . SCC (squamous cell carcinoma) 08/04/2014   left shin (MOHS)  . Superficial basal cell carcinoma (BCC) 07/13/2015   right thigh (tx p bx)    Past Surgical History:  Procedure Laterality Date  . CESAREAN SECTION    . THORACIC SPINE SURGERY     Dr. Tonita Cong  . TUBAL LIGATION      There were no vitals filed for this visit.  Subjective Assessment - 09/09/19 1107    Subjective  I saw the podiatrist and I have any ultrasound scheduled this week.    Currently in Pain?  Yes    Pain Score  4     Pain Location  Toe (Comment  which one)    Pain Orientation  Left;Right    Pain Descriptors / Indicators  Tightness    Pain Type  Chronic pain         [facility] PT Assessment - 09/09/19 0001      Assessment   Medical Diagnosis  bilateral foot pain, DJD    Referring Provider (PT)  Lavone Orn, MD    Onset Date/Surgical Date  07/07/17   chronic     Cognition   Overall Cognitive Status  Within Functional Limits for tasks assessed      Observation/Other Assessments   Focus on Therapeutic Outcomes (FOTO)   41% limitation      Transfers   Transfers  Stand to Sit;Sit to Stand;Independent with all Transfers    Five time sit to stand comments   12.72 seconds    Stand to Sit  7: Independent    Comments  alternating step taps on 6" step 9.32 seconds                   [facility] Adult PT Treatment/Exercise - 09/09/19 0001      Knee/Hip Exercises: Aerobic   Nustep  Level 2x 10 minutes   PT present to discuss progress     Knee/Hip  Exercises: Standing   Heel Raises  Both;2 sets;10 reps    Hip Abduction  Stengthening;Both;2 sets;10 reps;Knee straight    Abduction Limitations  2#    Hip Extension  Stengthening;Both;2 sets;10 reps;Knee straight    Extension Limitations  2#    Rocker Board  3 minutes    Rebounder  weight shifting 3 ways: 1 minute each       Knee/Hip Exercises: Seated   Long Arc Quad  Strengthening;2 sets;10 reps;Weights    Long Arc Quad Weight  2 lbs.    Marching  Strengthening;2 sets;10 reps;Weights    Marching Weights  2 lbs.    Sit to Sand  2 sets;10 reps;without UE support               PT Short Term Goals - 09/02/19 1020      PT SHORT TERM GOAL #2   Title  report < or = to 5/10 bil foot pain with standing and walking    Status  Achieved      PT SHORT TERM GOAL #3   Title  demonstrate consistent heel strike with gait on level surface        PT Long Term Goals - 09/02/19 1020      PT LONG TERM GOAL #1   Title  be independent in advanced HEP    Time  6    Period   Weeks    Status  On-going    Target Date  10/14/19      PT LONG TERM GOAL #2   Title  report a 75% reduction in bil foot pain with standing and walking    Baseline  60% reduction    Time  6    Status  Revised    Target Date  10/14/19      PT LONG TERM GOAL #3   Title  reduce foot pain to walk for 25 minutes without limitation    Baseline  unsure- not able to walk    Time  6    Period  Weeks    Status  On-going    Target Date  10/14/19      PT LONG TERM GOAL #4   Title  demonstrate consistent heel strike and improved trunk rotation with gait on level surface to improve safety    Baseline  inconsistent due to new onset of bil toe pain    Time  6    Status  On-going    Target Date  10/14/19      PT LONG TERM GOAL #5   Title  perform alternating step-taps x 8 on 6" step in < or = to 8 seconds to reduce falls risk    Baseline  9.32 seconds    Time  6    Period  Weeks    Status  On-going    Target Date  10/14/19      PT LONG TERM GOAL #6   Title  reduce FOTO to < or = to 34% limitation    Time  6    Period  Weeks    Status  On-going    Target Date  10/14/19            Plan - 09/09/19 1134    Clinical Impression Statement  Pt presents with bil. Great toe pain that began 3  weeks ago.  Pt saw MD last week and will have an ultrasound this week.     Pt reports a 60% reduction  in original foot pain that she presented with.  Pt with improved time with 6" alternating taps indicating a reduced risk of falls.  Pt is more consistent with heel strike and is demonstrating this ~50% of the time. Pt with new onset of great toe pain and this has limited her ability to perform standing exercise and walk long distances.  Pt will continue to benefit from skilled PT to address gait, strength, balance and endurance to improve safety and function at home and in the community.    PT Frequency  2x / week    PT Duration  6 weeks    PT Treatment/Interventions  ADLs/Self Care Home  Management;Electrical Stimulation;Cryotherapy;Moist Heat;Balance training;Therapeutic exercise;Therapeutic activities;Functional mobility training;Stair training;Gait training;Neuromuscular re-education;Patient/family education;Passive range of motion;Manual techniques;Taping    PT Next Visit Plan  [place] on balance, proprioception, strength and address foot pain as needed    PT Home Exercise Plan  Access Code: [email or web address]    Consulted and Agree with Plan of Care  Patient       Patient will benefit from skilled therapeutic intervention in order to improve the following deficits and impairments:  Abnormal gait, Decreased activity tolerance, Decreased balance, Decreased strength, Decreased endurance, Decreased range of motion, Difficulty walking, Impaired flexibility, Pain  Visit Diagnosis: Pain in right ankle and joints of right foot  Other abnormalities of gait and mobility  Pain in left ankle and joints of left foot     Problem List Patient Active Problem List   Diagnosis Date Noted  . Greater trochanteric pain syndrome of left lower extremity 10/31/2017  . Retrocalcaneal bursitis (back of heel), left 10/31/2017  . Benign essential HTN 09/27/2017  . Benign neoplasm of colon 09/27/2017  . Cannot sleep 09/27/2017  . Constipation by outlet dysfunction 09/27/2017  . Hematuria syndrome 09/27/2017  . HLD (hyperlipidemia) 09/27/2017  . Radiculitis 09/27/2017    Sigurd Sos, PT 09/09/19 11:48 AM   [facility] [address], [place], [place], [address] Phone: [phone]   Fax:  [phone]  Name: Jodi Cline MRN: [id number] Date of Birth: [birth year]

## 2019-09-11 ENCOUNTER — Other Ambulatory Visit: Payer: Self-pay

## 2019-09-11 ENCOUNTER — Ambulatory Visit: Payer: Medicare HMO

## 2019-09-11 DIAGNOSIS — M25572 Pain in left ankle and joints of left foot: Secondary | ICD-10-CM

## 2019-09-11 DIAGNOSIS — R2689 Other abnormalities of gait and mobility: Secondary | ICD-10-CM

## 2019-09-11 DIAGNOSIS — M25571 Pain in right ankle and joints of right foot: Secondary | ICD-10-CM | POA: Diagnosis not present

## 2019-09-11 NOTE — Therapy (Addendum)
[facility] [address] [place], [place], [address] Phone: [phone]   Fax:  [phone]  Physical Therapy Treatment  Patient Details  Name: Jodi Cline MRN: [id number] Date of Birth: Feb 29, 1941 Referring Provider (PT): Lavone Orn, MD   Encounter Date: 09/11/2019  PT End of Session - 09/11/19 1229    Visit Number  11    Date for PT Re-Evaluation  10/14/19    Authorization Type  Medicare    PT Start Time  1148    PT Stop Time  1228    PT Time Calculation (min)  40 min    Activity Tolerance  Patient tolerated treatment well    Behavior During Therapy  [facility] for tasks assessed/performed       Past Medical History:  Diagnosis Date  . BCC (basal cell carcinoma) 04/18/2005   Right mid thigh (CX35FU)  . BCC (basal cell carcinoma) 04/07/2009   right shoulder blade (Cx3)  . Hyperlipidemia   . Hypertension   . Insomnia   . Nodular basal cell carcinoma (BCC) 08/05/2013   right shoulder blade (EXC)  . SCC (squamous cell carcinoma) 02/19/2001   left arm (CX35FU)  . SCC (squamous cell carcinoma) 04/23/2013   left shin (txpbx)  . SCC (squamous cell carcinoma) 08/05/2013   left shin (txpbx)  . SCC (squamous cell carcinoma) 05/07/2014   Left shin (txpbx)  . SCC (squamous cell carcinoma) 08/04/2014   left shin (MOHS)  . Superficial basal cell carcinoma (BCC) 07/13/2015   right thigh (tx p bx)    Past Surgical History:  Procedure Laterality Date  . CESAREAN SECTION    . THORACIC SPINE SURGERY     Dr. Tonita Cong  . TUBAL LIGATION      There were no vitals filed for this visit.  Subjective Assessment - 09/11/19 1151    Subjective  My legs feel weak today.    Currently in Pain?  No/denies                       [facility] Adult PT Treatment/Exercise - 09/11/19 0001      Knee/Hip Exercises: Aerobic   Nustep  Level 2x 10 minutes   PT present to discuss progress     Knee/Hip Exercises: Standing   Heel Raises  Both;2 sets;10 reps    Hip Abduction  Stengthening;Both;2 sets;10 reps;Knee straight    Abduction Limitations  2#- standing on black pad    Hip Extension  Stengthening;Both;2 sets;10 reps;Knee straight    Extension Limitations  2#- standing on black pad    Rocker Board  3 minutes    Rebounder  weight shifting 3 ways: 1 minute each     Other Standing Knee Exercises  alternating step taps on 6" step 2x10 bil    Other Standing Knee Exercises  tandem stance on black pad x 30 seconds each               PT Short Term Goals - 09/02/19 1020      PT SHORT TERM GOAL #2   Title  report < or = to 5/10 bil foot pain with standing and walking    Status  Achieved      PT SHORT TERM GOAL #3   Title  demonstrate consistent heel strike with gait on level surface        PT Long Term Goals - 09/02/19 1020      PT LONG TERM GOAL #1  Title  be independent in advanced HEP    Time  6    Period  Weeks    Status  On-going    Target Date  10/14/19      PT LONG TERM GOAL #2   Title  report a 75% reduction in bil foot pain with standing and walking    Baseline  60% reduction    Time  6    Status  Revised    Target Date  10/14/19      PT LONG TERM GOAL #3   Title  reduce foot pain to walk for 25 minutes without limitation    Baseline  unsure- not able to walk    Time  6    Period  Weeks    Status  On-going    Target Date  10/14/19      PT LONG TERM GOAL #4   Title  demonstrate consistent heel strike and improved trunk rotation with gait on level surface to improve safety    Baseline  inconsistent due to new onset of bil toe pain    Time  6    Status  On-going    Target Date  10/14/19      PT LONG TERM GOAL #5   Title  perform alternating step-taps x 8 on 6" step in < or = to 8 seconds to reduce falls risk    Baseline  9.32 seconds    Time  6    Period  Weeks    Status  On-going    Target Date  10/14/19      PT LONG TERM GOAL #6   Title  reduce FOTO to < or = to  34% limitation    Time  6    Period  Weeks    Status  On-going    Target Date  10/14/19            Plan - 09/11/19 1227    Clinical Impression Statement  Pt continues to report bil foot pain and will have ultrasound at the podiatrist tomorrow.  Pt presents today with LE fatigue at the beginning of session that improved with activity in the clinic today.  Pt more consistently performs heel strike bilaterally more consistently and reports fewer episodes of imbalance.  Pt required stand by assistance for safety with balance tasks.  Pt will continue to benefit from skilled PT for strength, balance and flexibility.    PT Frequency  2x / week    PT Duration  6 weeks    PT Treatment/Interventions  ADLs/Self Care Home Management;Electrical Stimulation;Cryotherapy;Moist Heat;Balance training;Therapeutic exercise;Therapeutic activities;Functional mobility training;Stair training;Gait training;Neuromuscular re-education;Patient/family education;Passive range of motion;Manual techniques;Taping    PT Next Visit Plan  see what results are after ultrasound.  work on balance, strength, and proprioception    PT Home Exercise Plan  Access Code: [email or web address]    Consulted and Agree with Plan of Care  Patient       Patient will benefit from skilled therapeutic intervention in order to improve the following deficits and impairments:  Abnormal gait, Decreased activity tolerance, Decreased balance, Decreased strength, Decreased endurance, Decreased range of motion, Difficulty walking, Impaired flexibility, Pain  Visit Diagnosis: Pain in right ankle and joints of right foot  Other abnormalities of gait and mobility  Pain in left ankle and joints of left foot     Problem List Patient Active Problem List   Diagnosis Date Noted  . Greater trochanteric pain syndrome of left lower  extremity 10/31/2017  . Retrocalcaneal bursitis (back of heel), left 10/31/2017  . Benign essential HTN 09/27/2017  . Benign  neoplasm of colon 09/27/2017  . Cannot sleep 09/27/2017  . Constipation by outlet dysfunction 09/27/2017  . Hematuria syndrome 09/27/2017  . HLD (hyperlipidemia) 09/27/2017  . Radiculitis 09/27/2017     Sigurd Sos, PT 09/11/19 12:31 PM PHYSICAL THERAPY DISCHARGE SUMMARY  Visits from Start of Care: 11  Current functional level related to goals / functional outcomes: Pt called to cancel all remaining appointments and said she would continue with her exercises on her own.     Remaining deficits: See above for current goal status.     Education / Equipment: HEP Plan: Patient agrees to discharge.  Patient goals were partially met. Patient is being discharged due to being pleased with the current functional level.  ?????  Sigurd Sos, PT 09/17/19 1:54 PM       [facility] [address], [place], [place], [address] Phone: [phone]   Fax:  [phone]  Name: Jodi Cline MRN: [id number] Date of Birth: May 25, 1941

## 2019-09-12 ENCOUNTER — Ambulatory Visit (HOSPITAL_COMMUNITY)
Admission: RE | Admit: 2019-09-12 | Discharge: 2019-09-12 | Disposition: A | Discharge status: Home / Self Care | Payer: Medicare HMO | Source: Ambulatory Visit | Attending: Cardiovascular Disease | Admitting: Cardiovascular Disease

## 2019-09-12 ENCOUNTER — Other Ambulatory Visit: Payer: Self-pay

## 2019-09-12 DIAGNOSIS — I999 Unspecified disorder of circulatory system: Secondary | ICD-10-CM | POA: Diagnosis not present

## 2019-09-12 DIAGNOSIS — M79671 Pain in right foot: Secondary | ICD-10-CM | POA: Diagnosis not present

## 2019-09-12 DIAGNOSIS — M79672 Pain in left foot: Secondary | ICD-10-CM | POA: Diagnosis not present

## 2019-09-19 ENCOUNTER — Telehealth: Payer: Self-pay | Admitting: *Deleted

## 2019-09-19 ENCOUNTER — Telehealth: Payer: Self-pay | Admitting: Podiatry

## 2019-09-19 DIAGNOSIS — I999 Unspecified disorder of circulatory system: Secondary | ICD-10-CM

## 2019-09-19 NOTE — Telephone Encounter (Signed)
Pt would like results from test

## 2019-09-19 NOTE — Telephone Encounter (Signed)
Pt called for results of doppler studies.

## 2019-09-19 NOTE — Telephone Encounter (Signed)
I informed pt our office had the final results and once Dr. Paulla Dolly reviewed I would call with instructions.

## 2019-09-22 NOTE — Telephone Encounter (Signed)
She does have vascular disease and they have recommended appointment with vascular doc. Should be scheduling

## 2019-09-22 NOTE — Telephone Encounter (Signed)
Pt called back to follow up on her test results. Please give patient a call once results are ready

## 2019-09-22 NOTE — Telephone Encounter (Addendum)
I informed pt of Dr. Mellody Drown review of the results stating one test was abnormal and he would like her referred to [facility] for consultation for vascularization. Faxed referral to [facility].

## 2019-09-22 NOTE — Addendum Note (Signed)
Addended by: Harriett Sine D on: 09/22/2019 12:04 PM   Modules accepted: Orders

## 2019-09-23 ENCOUNTER — Ambulatory Visit: Payer: Medicare HMO

## 2019-09-25 ENCOUNTER — Ambulatory Visit: Payer: Medicare HMO | Admitting: Internal Medicine

## 2019-09-26 ENCOUNTER — Other Ambulatory Visit: Payer: Self-pay

## 2019-09-26 ENCOUNTER — Encounter: Payer: Self-pay | Admitting: Internal Medicine

## 2019-09-26 ENCOUNTER — Ambulatory Visit: Payer: Medicare HMO | Admitting: Internal Medicine

## 2019-09-26 VITALS — BP 161/80 | HR 73 | Temp 96.8°F | Ht 66.0 in | Wt 158.6 lb

## 2019-09-26 DIAGNOSIS — I447 Left bundle-branch block, unspecified: Secondary | ICD-10-CM | POA: Diagnosis not present

## 2019-09-26 DIAGNOSIS — E785 Hyperlipidemia, unspecified: Secondary | ICD-10-CM | POA: Diagnosis not present

## 2019-09-26 DIAGNOSIS — I739 Peripheral vascular disease, unspecified: Secondary | ICD-10-CM

## 2019-09-26 DIAGNOSIS — I10 Essential (primary) hypertension: Secondary | ICD-10-CM | POA: Diagnosis not present

## 2019-09-26 NOTE — Patient Instructions (Addendum)
Medication Instructions:  No changes   *If you need a refill on your cardiac medications before your next appointment, please call your pharmacy*  Lab Work: Not needed  Testing/Procedures: WILL BE SCHEDULE AT [place] [address] Your physician has requested that you have an echocardiogram. Echocardiography is a painless test that uses sound waves to create images of your heart. It provides your doctor with information about the size and shape of your heart and how well your heart's chambers and valves are working. This procedure takes approximately one hour. There are no restrictions for this procedure.    Follow-Up: At [facility], you and your health needs are our priority.  As part of our continuing mission to provide you with exceptional heart care, we have created designated Provider Care Teams.  These Care Teams include your primary Cardiologist (physician) and Advanced Practice Providers (APPs -  Physician Assistants and Nurse Practitioners) who all work together to provide you with the care you need, when you need it.  Your next appointment:   3  TO 4 week(s)  The format for your next appointment:   Either In Person or Virtual  Provider:   Cherlynn Kaiser, MD  Other Instructions You have been referred to Dr Fletcher Anon - to discuss  Lower extremity Doppler

## 2019-09-26 NOTE — Progress Notes (Signed)
Cardiology Office Note:    Date:  09/26/2019   ID:  Jodi Cline, DOB [birth year], MRN [id number]  PCP:  Lavone Orn, MD  Cardiologist:  No primary care provider on file.  Electrophysiologist:  None   Referring MD: Lavone Orn, MD   Chief Complaint: PAD  History of Present Illness:    Jodi Cline is a 79 y.o. female with a hx of hyperlipidemia, hypertension, squamous cell carcinomas and basal cell carcinomas of the skin, who presents today at the request of Dr. Paulla Dolly in podiatry for peripheral arterial disease.    Patient is dealt with issues with plantar fasciitis for some time, but her discomfort in her leg feels different than that now.  She tells me that her legs hurt very frequently and limits her ability to be active.  She takes ibuprofen 2 tabs at least once a day.  This symptom has been going on for at least 3 to 4 years.  She feels unstable on her feet and she is trying to be careful.  She has cramping discomfort in her bilateral calves, and recently due to left great toe pain, sought further evaluation.  She describes leg pain while sleeping, does not endorse dependent pallor or erythema.  She used to walk but has stopped due to leg discomfort.  She tells me that standing for 45 minutes will give her pain.  In addition she can walk for 30 minutes on a flat surface, however if she tries to go up a hill she will have discomfort in her leg.  When she stops this discomfort improves.  She is also endorsing bilateral hip pain at rest and with activity.  She cannot definitively say if the hip pain worsens with activity because she has severely restricted her activities from all the lower extremity discomfort.  Risk factors for peripheral arterial disease and coronary artery disease include 20-pack-year smoking history, quit when she was in her 58s.  Hypertension, hyperlipidemia.   She tells me that she required hyperbaric oxygen therapy for nonhealing wounds on her shins due to trauma  from working in her garden and a piece of equipment falling across her shins and causing damage to the skin.  She denies chest pain, shortness of breath, palpitations, PND, orthopnea, leg swelling.  Denies syncope.  Denies dizziness.  Has no significant cardiac exertional symptoms, though she has limited her activity quite a bit.  She recently stopped her Crestor thinking this medicine may be contributing to her symptoms, she has been off for 4 days.  Past Medical History:  Diagnosis Date   BCC (basal cell carcinoma) 04/18/2005   Right mid thigh (CX35FU)   BCC (basal cell carcinoma) 04/07/2009   right shoulder blade (Cx3)   Hyperlipidemia    Hypertension    Insomnia    Nodular basal cell carcinoma (BCC) 08/05/2013   right shoulder blade (EXC)   SCC (squamous cell carcinoma) 02/19/2001   left arm (CX35FU)   SCC (squamous cell carcinoma) 04/23/2013   left shin (txpbx)   SCC (squamous cell carcinoma) 08/05/2013   left shin (txpbx)   SCC (squamous cell carcinoma) 05/07/2014   Left shin (txpbx)   SCC (squamous cell carcinoma) 08/04/2014   left shin (MOHS)   Superficial basal cell carcinoma (BCC) 07/13/2015   right thigh (tx p bx)    Past Surgical History:  Procedure Laterality Date   CESAREAN SECTION     THORACIC SPINE SURGERY     Dr. Tonita Cong   TUBAL LIGATION  Current Medications: Current Meds  Medication Sig   amLODipine (NORVASC) 5 MG tablet Take 1 tablet by mouth daily.   calcium carbonate (TUMS - DOSED IN MG ELEMENTAL CALCIUM) 500 MG chewable tablet    hydrochlorothiazide (HYDRODIURIL) 25 MG tablet Take 1 tablet by mouth daily.   Magnesium 400 MG TABS    oxyCODONE (OXY IR/ROXICODONE) 5 MG immediate release tablet Take 1 tablet (5 mg total) by mouth every 6 (six) hours as needed for severe pain.   potassium chloride (K-DUR,KLOR-CON) 10 MEQ tablet Take 10 mEq by mouth daily.   Probiotic Product (PROBIOTIC-10 PO) Take by mouth.   rosuvastatin  (CRESTOR) 10 MG tablet    zolpidem (AMBIEN) 10 MG tablet Take 1 tablet by mouth at bedtime.     Allergies:   Benazepril and Cephalexin   Social History   Socioeconomic History   Marital status: Married    Spouse name: Not on file   Number of children: Not on file   Years of education: Not on file   Highest education level: Not on file  Occupational History   Not on file  Tobacco Use   Smoking status: Former Smoker   Smokeless tobacco: Never Used  Substance and Sexual Activity   Alcohol use: Not on file   Drug use: Not on file   Sexual activity: Not on file  Other Topics Concern   Not on file  Social History Narrative   Not on file   Social Determinants of Health   Financial Resource Strain:    Difficulty of Paying Living Expenses: Not on file  Food Insecurity:    Worried About [place] in the Last Year: Not on file   Ran Out of Food in the Last Year: Not on file  Transportation Needs:    Lack of Transportation (Medical): Not on file   Lack of Transportation (Non-Medical): Not on file  Physical Activity:    Days of Exercise per Week: Not on file   Minutes of Exercise per Session: Not on file  Stress:    Feeling of Stress : Not on file  Social Connections:    Frequency of Communication with Friends and Family: Not on file   Frequency of Social Gatherings with Friends and Family: Not on file   Attends Religious Services: Not on file   Active Member of Clubs or Organizations: Not on file   Attends Archivist Meetings: Not on file   Marital Status: Not on file     Family History: The patient's family history is unremarkable for early CAD.  ROS:   Please see the history of present illness.    All other systems reviewed and are negative.  EKGs/Labs/Other Studies Reviewed:    The following studies were reviewed today:  EKG: Normal sinus rhythm, left bundle branch block, QRS duration 152 ms.  No prior ECG available  for comparison.  Recent Labs: No results found for requested labs within last 8760 hours.  Recent Lipid Panel No results found for: CHOL, TRIG, HDL, CHOLHDL, VLDL, LDLCALC, LDLDIRECT  Physical Exam:    VS:  BP (!) 161/80 (BP Location: Right Arm)    Pulse 73    Temp (!) 96.8 F (36 C)    Ht 5\' 6"  (1.676 m)    Wt 158 lb 9.6 oz (71.9 kg)    SpO2 99%    BMI 25.60 kg/m     Wt Readings from Last 5 Encounters:  09/26/19 158 lb 9.6 oz (  71.9 kg)  04/03/19 160 lb (72.6 kg)  01/24/18 159 lb 14.4 oz (72.5 kg)  11/28/17 161 lb 12.8 oz (73.4 kg)  10/31/17 163 lb 6.4 oz (74.1 kg)     Constitutional: No acute distress Eyes: sclera non-icteric, normal conjunctiva and lids ENMT: normal dentition, moist mucous membranes Cardiovascular: regular rhythm, normal rate, no murmurs. S1 and S2 normal. Radial pulses normal bilaterally. No jugular venous distention.  Respiratory: clear to auscultation bilaterally GI : normal bowel sounds, soft and nontender. No distention.   MSK: extremities warm, well perfused. No edema.  Bilateral feet examined, no ulcerations, thickened toenails. NEURO: grossly nonfocal exam, moves all extremities. PSYCH: alert and oriented x 3, normal mood and affect.    ASSESSMENT:    1. PAD (peripheral artery disease) ([place])   2. LBBB (left bundle branch block)   3. Essential hypertension   4. Hyperlipidemia, unspecified hyperlipidemia type    PLAN:    Peripheral arterial disease-patient was very anxious and worried about the results of her arterial vascular studies.  We reviewed test results in great detail.  Resting ABIs were normal bilaterally.  Right leg demonstrates one-vessel runoff of peroneal artery and occluded mid to distal anterior and posterior tibial arteries.  Left leg demonstrates a mildly prominent popliteal artery measuring 1 cm, with one-vessel runoff of the anterior tibial artery which is 75 to 99% stenosis in the distal segment, distal peroneal artery is not  visualized, distal PTA is occluded.  Vascular consultation was recommended in the report by Dr. Rockey Situ.  We reviewed that with normal ABIs and distal disease without evidence of lower extremity nonhealing wounds or definite rest pain, our first step would be medical management and a walking program.  We discussed indications for walking therapy to strengthen collaterals.  In addition she should restart crestor 10 mg daily, with a plan to intensify statin therapy to 20 mg.   Given that her initial referral was for vascular medicine and her vascular studies recommend vascular consultation, I would be happy to refer her to my colleague Dr. Fletcher Anon for further review. We have arranged this visit for next week.   Left bundle branch block-the chronicity of her LBBB is unknown.  I have independently reviewed the images from a chest CT with contrast performed in October 2019.  This demonstrates aortic valve and coronary artery calcifications.  She and I discussed that disease in 1 vascular bed typically indicates high probability of disease in other vascular beds.  It is possible that her left bundle branch block is related to coronary artery disease.  Fortunately at the moment she is asymptomatic from a cardiopulmonary standpoint, however has limited her activity.  We at least need to obtain an echocardiogram to evaluate for regional wall motion abnormalities and ventricular function.  We will have a low threshold for stress testing particularly when she increases her activity for her peripheral vascular disease.  I have asked her to contact me if she has any chest discomfort or shortness of breath.   HTN - feels that her bp today is erroneously high due to being upset. BP usually 120s/80s continue amlodipine 5mg  daily and hctz 25mg    HLD - lipids have not been checked recently, however given CAC she has indications for high intensity statin therapy for secondary prevention of CAD. We will check lipids to ensure  therapy is optimal and will intensify statin today.    Total time of encounter: 60 minutes total time of encounter, including 40 minutes spent  in face-to-face patient care. This time includes coordination of care and counseling regarding PAD, CAD, LBBB. Remainder of non-face-to-face time involved reviewing chart documents/testing relevant to the patient encounter and documentation in the medical record.  Cherlynn Kaiser, MD [place]   [facility]   Medication Adjustments/Labs and Tests Ordered: Current medicines are reviewed at length with the patient today.  Concerns regarding medicines are outlined above.  No orders of the defined types were placed in this encounter.  No orders of the defined types were placed in this encounter.   There are no Patient Instructions on file for this visit.

## 2019-09-29 ENCOUNTER — Telehealth: Payer: Self-pay | Admitting: *Deleted

## 2019-09-29 DIAGNOSIS — I739 Peripheral vascular disease, unspecified: Secondary | ICD-10-CM

## 2019-09-29 DIAGNOSIS — E785 Hyperlipidemia, unspecified: Secondary | ICD-10-CM

## 2019-09-29 MED ORDER — ROSUVASTATIN CALCIUM 20 MG PO TABS: 20.0000 mg | ORAL_TABLET | Freq: Every day | ORAL | 3 refills | Status: DC

## 2019-09-29 NOTE — Telephone Encounter (Signed)
-----   Message from Elouise Munroe, MD sent at 09/27/2019  3:19 PM EST ----- Let's check lipids when she comes to see Arida, and let's increase her Crestor to 20 mg daily for evidence of coronary artery calcium on a CT scan from a few years ago.   Thanks, GA

## 2019-09-29 NOTE — Telephone Encounter (Signed)
Spoke to patient . She is aware of labs for tomorrow , medication e-sent to mail ordered . 90 days x 3 refills

## 2019-09-30 ENCOUNTER — Other Ambulatory Visit: Payer: Self-pay

## 2019-09-30 ENCOUNTER — Encounter: Payer: Self-pay | Admitting: Cardiovascular Disease

## 2019-09-30 ENCOUNTER — Ambulatory Visit: Payer: Medicare HMO | Admitting: Cardiovascular Disease

## 2019-09-30 VITALS — BP 128/72 | Temp 96.4°F | Ht 66.0 in | Wt 157.4 lb

## 2019-09-30 DIAGNOSIS — I739 Peripheral vascular disease, unspecified: Secondary | ICD-10-CM | POA: Diagnosis not present

## 2019-09-30 DIAGNOSIS — Z136 Encounter for screening for cardiovascular disorders: Secondary | ICD-10-CM | POA: Diagnosis not present

## 2019-09-30 DIAGNOSIS — E785 Hyperlipidemia, unspecified: Secondary | ICD-10-CM

## 2019-09-30 DIAGNOSIS — I10 Essential (primary) hypertension: Secondary | ICD-10-CM

## 2019-09-30 LAB — LIPID PANEL
Chol/HDL Ratio: 2.4 ratio (ref 0.0–4.4)
Cholesterol, Total: 228 mg/dL — ABNORMAL HIGH (ref 100–199)
HDL: 95 mg/dL (ref >39)
LDL Chol Calc (NIH): 115 mg/dL — ABNORMAL HIGH (ref 0–99)
Triglycerides: 105 mg/dL (ref 0–149)
VLDL Cholesterol Cal: 18 mg/dL (ref 5–40)

## 2019-09-30 MED ORDER — ASPIRIN EC 81 MG PO TBEC: 81.0000 mg | DELAYED_RELEASE_TABLET | Freq: Every day | ORAL | 3 refills | Status: DC

## 2019-09-30 MED ORDER — CILOSTAZOL 50 MG PO TABS: 50.0000 mg | ORAL_TABLET | Freq: Two times a day (BID) | ORAL | 4 refills | Status: DC

## 2019-09-30 NOTE — Patient Instructions (Signed)
Medication Instructions:  START Aspirin 81 mg once daily START Cilostazol (Pletal) 50 mg twice daily  *If you need a refill on your cardiac medications before your next appointment, please call your pharmacy*  Lab Work: None ordered If you have labs (blood work) drawn today and your tests are completely normal, you will receive your results only by: Marland Kitchen MyChart Message (if you have MyChart) OR . A paper copy in the mail If you have any lab test that is abnormal or we need to change your treatment, we will call you to review the results.  Testing/Procedures: Your physician has requested that you have an abdominal aorta duplex. During this test, an ultrasound is used to evaluate the aorta. Allow 30 minutes for this exam. Do not eat after midnight the day before and avoid carbonated beverages. This will take place at [place], [address].   Follow-Up: At [facility], you and your health needs are our priority.  As part of our continuing mission to provide you with exceptional heart care, we have created designated Provider Care Teams.  These Care Teams include your primary Cardiologist (physician) and Advanced Practice Providers (APPs -  Physician Assistants and Nurse Practitioners) who all work together to provide you with the care you need, when you need it.  Your next appointment:   3 month(s)  The format for your next appointment:   In Person  Provider:   Kathlyn Sacramento, MD

## 2019-09-30 NOTE — Progress Notes (Signed)
Cardiology Office Note   Date:  09/30/2019   ID:  Jodi Cline, DOB 1941-01-27, MRN [id number]  PCP:  Lavone Orn, MD  Cardiologist:   Kathlyn Sacramento, MD   No chief complaint on file.     History of Present Illness: Jodi Cline is a 79 y.o. female who was referred by Dr. Margaretann Loveless for evaluation management of peripheral arterial disease.  She was initially referred by Dr. Paulla Dolly.  She has known history of hyperlipidemia, previous tobacco use, essential hypertension and squamous cell carcinoma of the skin. She has prolonged history of plantar fasciitis but recently complained of bilateral leg pain that limits her ability to walk.  She has occasional cramps in her bilateral calves with walking.  However, she is mostly bothered by bilateral foot pain when she walks especially on the left side.  She also had recent discomfort in her left big toe which she thought was due to gout.  That prompted her to see podiatry who referred her for vascular testing.  ABI was normal bilaterally.  However, duplex showed one-vessel runoff below the knee on the right side via the peroneal artery.  On the left, the proximal popliteal artery was ectatic measuring 1 cm with one-vessel runoff below the knee via the anterior tibial artery which was stenosed significantly in the distal segment. She has no lower extremity ulceration. She currently denies any chest pain or shortness of breath.  She was recently found to have left bundle branch block and she will be undergoing an echocardiogram.  Past Medical History:  Diagnosis Date  . BCC (basal cell carcinoma) 04/18/2005   Right mid thigh (CX35FU)  . BCC (basal cell carcinoma) 04/07/2009   right shoulder blade (Cx3)  . Hyperlipidemia   . Hypertension   . Insomnia   . Nodular basal cell carcinoma (BCC) 08/05/2013   right shoulder blade (EXC)  . SCC (squamous cell carcinoma) 02/19/2001   left arm (CX35FU)  . SCC (squamous cell carcinoma) 04/23/2013   left  shin (txpbx)  . SCC (squamous cell carcinoma) 08/05/2013   left shin (txpbx)  . SCC (squamous cell carcinoma) 05/07/2014   Left shin (txpbx)  . SCC (squamous cell carcinoma) 08/04/2014   left shin (MOHS)  . Superficial basal cell carcinoma (BCC) 07/13/2015   right thigh (tx p bx)    Past Surgical History:  Procedure Laterality Date  . CESAREAN SECTION    . THORACIC SPINE SURGERY     Dr. Tonita Cong  . TUBAL LIGATION       Current Outpatient Medications  Medication Sig Dispense Refill  . amLODipine (NORVASC) 5 MG tablet Take 1 tablet by mouth daily.    . calcium carbonate (TUMS - DOSED IN MG ELEMENTAL CALCIUM) 500 MG chewable tablet     . celecoxib (CELEBREX) 100 MG capsule TAKE 1 CAPSULE(100 MG) BY MOUTH TWICE DAILY (Patient not taking: Reported on 09/26/2019) 60 capsule 2  . gabapentin (NEURONTIN) 300 MG capsule TK 1 C PO HS    . hydrochlorothiazide (HYDRODIURIL) 25 MG tablet Take 1 tablet by mouth daily.    . Magnesium 400 MG TABS     . nitroGLYCERIN (NITRODUR - DOSED IN MG/24 HR) 0.2 mg/hr patch PLACE 1/4 TO 1/2 PATCH OVER AFFECTED AREA. REMOVE AND REPLACE ONCE DAILY. SLIGHTLY ALTER SKIN PLACEMENT EACH DAY. (Patient not taking: Reported on 09/26/2019) 30 patch 0  . oxyCODONE (OXY IR/ROXICODONE) 5 MG immediate release tablet Take 1 tablet (5 mg total) by mouth every 6 (  six) hours as needed for severe pain. (Patient not taking: Reported on 09/30/2019) 30 tablet 0  . potassium chloride (K-DUR,KLOR-CON) 10 MEQ tablet Take 10 mEq by mouth daily.    . Probiotic Product (PROBIOTIC-10 PO) Take by mouth.    . rosuvastatin (CRESTOR) 20 MG tablet Take 1 tablet (20 mg total) by mouth daily. 90 tablet 3  . zolpidem (AMBIEN) 10 MG tablet Take 1 tablet by mouth at bedtime.     No current facility-administered medications for this visit.    Allergies:   Benazepril and Cephalexin    Social History:  The patient  reports that she has quit smoking. She has never used smokeless tobacco.   Family  History:  The patient's family history is remarkable for aortic aneurysm affecting her grandfather.  No cardiac history.   ROS:  Please see the history of present illness.   Otherwise, review of systems are positive for none.   All other systems are reviewed and negative.    PHYSICAL EXAM: VS:  BP 128/72   Temp (!) 96.4 F (35.8 C)   Ht 5\' 6"  (1.676 m)   Wt 157 lb 6.4 oz (71.4 kg)   BMI 25.41 kg/m  , BMI Body mass index is 25.41 kg/m. GEN: Well nourished, well developed, in no acute distress  HEENT: normal  Neck: no JVD, carotid bruits, or masses Cardiac: RRR; no murmurs, rubs, or gallops,no edema  Respiratory:  clear to auscultation bilaterally, normal work of breathing GI: soft, nontender, nondistended, + BS MS: no deformity or atrophy  Skin: warm and dry, no rash Neuro:  Strength and sensation are intact Psych: euthymic mood, full affect Vascular: Femoral pulses normal bilaterally.  Dorsalis pedis is +2 on the right and +1 on the left.  Posterior tibial is not palpable  EKG:  EKG is not ordered today.    Recent Labs: No results found for requested labs within last 8760 hours.    Lipid Panel No results found for: CHOL, TRIG, HDL, CHOLHDL, VLDL, LDLCALC, LDLDIRECT    Wt Readings from Last 3 Encounters:  09/30/19 157 lb 6.4 oz (71.4 kg)  09/26/19 158 lb 9.6 oz (71.9 kg)  04/03/19 160 lb (72.6 kg)       No flowsheet data found.    ASSESSMENT AND PLAN:  1.  Peripheral arterial disease: The patient seems to have bilateral foot claudication worse on the left side likely due to below the knee disease.  Fortunately, she has no evidence of critical limb ischemia.  I discussed with her the natural history and management of peripheral arterial disease.  I recommend starting aspirin 81 mg once daily and in addition I elected to start her on cilostazol 50 mg twice daily.  I encouraged her to start a walking exercise program. Reevaluate symptoms in 3 months and reserve  angiography for refractory symptoms.  2.  Abdominal aortic aneurysm screening: The patient has family history of aortic aneurysm and she is a previous smoker.  In addition, left popliteal artery was noted to be ectatic and thus I do think we have to screen for abdominal aortic aneurysm.  I requested an ultrasound.  3.  Essential hypertension: Blood pressure is controlled on current medications.  4.  Hyperlipidemia: Continue treatment with rosuvastatin with a target LDL of less than 70.  5.  Left bundle branch block: She has no symptoms of angina or heart failure.  She is scheduled for echocardiogram in the near future.   Disposition:   FU  with me in 3 months  Signed,  Kathlyn Sacramento, MD  09/30/2019 8:43 AM    [place]

## 2019-10-03 ENCOUNTER — Ambulatory Visit (HOSPITAL_COMMUNITY)
Admission: RE | Admit: 2019-10-03 | Discharge: 2019-10-03 | Disposition: A | Discharge status: Home / Self Care | Payer: Medicare HMO | Source: Ambulatory Visit | Attending: Cardiovascular Disease | Admitting: Cardiovascular Disease

## 2019-10-03 ENCOUNTER — Other Ambulatory Visit: Payer: Self-pay

## 2019-10-03 DIAGNOSIS — Z87891 Personal history of nicotine dependence: Secondary | ICD-10-CM | POA: Insufficient documentation

## 2019-10-03 DIAGNOSIS — Z8249 Family history of ischemic heart disease and other diseases of the circulatory system: Secondary | ICD-10-CM | POA: Diagnosis not present

## 2019-10-03 DIAGNOSIS — Z136 Encounter for screening for cardiovascular disorders: Secondary | ICD-10-CM | POA: Insufficient documentation

## 2019-10-03 DIAGNOSIS — I700 Atherosclerosis of aorta: Secondary | ICD-10-CM | POA: Insufficient documentation

## 2019-10-06 ENCOUNTER — Ambulatory Visit: Payer: Medicare HMO | Attending: Internal Medicine

## 2019-10-06 DIAGNOSIS — Z23 Encounter for immunization: Secondary | ICD-10-CM

## 2019-10-06 NOTE — Progress Notes (Signed)
   Covid-19 Vaccination Clinic  Name:  Jodi Cline    MRN: [id number] DOB: 08/09/1941  10/06/2019  Jodi Cline was observed post Covid-19 immunization for 15 minutes without incidence. She was provided with Vaccine Information Sheet and instruction to access the V-Safe system.   Jodi Cline was instructed to call 911 with any severe reactions post vaccine: Marland Kitchen Difficulty breathing  . Swelling of your face and throat  . A fast heartbeat  . A bad rash all over your body  . Dizziness and weakness    Immunizations Administered    Name Date Dose VIS Date Route   Pfizer COVID-19 Vaccine 10/06/2019 11:04 AM 0.3 mL 08/08/2019 Intramuscular   Manufacturer: [place]   Lot: [id number]   [place]: [id number]

## 2019-10-08 ENCOUNTER — Other Ambulatory Visit: Payer: Self-pay

## 2019-10-08 ENCOUNTER — Ambulatory Visit (HOSPITAL_COMMUNITY): Payer: Medicare HMO | Attending: Internal Medicine

## 2019-10-08 DIAGNOSIS — I447 Left bundle-branch block, unspecified: Secondary | ICD-10-CM | POA: Diagnosis not present

## 2019-10-21 ENCOUNTER — Telehealth: Payer: Self-pay | Admitting: *Deleted

## 2019-10-21 ENCOUNTER — Encounter: Payer: Self-pay | Admitting: Internal Medicine

## 2019-10-21 ENCOUNTER — Telehealth (INDEPENDENT_AMBULATORY_CARE_PROVIDER_SITE_OTHER): Payer: Medicare HMO | Admitting: Internal Medicine

## 2019-10-21 VITALS — Ht 66.5 in | Wt 157.0 lb

## 2019-10-21 DIAGNOSIS — E785 Hyperlipidemia, unspecified: Secondary | ICD-10-CM

## 2019-10-21 DIAGNOSIS — I739 Peripheral vascular disease, unspecified: Secondary | ICD-10-CM | POA: Diagnosis not present

## 2019-10-21 DIAGNOSIS — I447 Left bundle-branch block, unspecified: Secondary | ICD-10-CM

## 2019-10-21 DIAGNOSIS — I10 Essential (primary) hypertension: Secondary | ICD-10-CM

## 2019-10-21 NOTE — Patient Instructions (Addendum)
Medication Instructions:  No changes *If you need a refill on your cardiac medications before your next appointment, please call your pharmacy*  Lab Work: Not needed  Testing/Procedures: Not needed  Follow-Up: At [facility], you and your health needs are our priority.  As part of our continuing mission to provide you with exceptional heart care, we have created designated Provider Care Teams.  These Care Teams include your primary Cardiologist (physician) and Advanced Practice Providers (APPs -  Physician Assistants and Nurse Practitioners) who all work together to provide you with the care you need, when you need it.  Your next appointment:   3 month(s)  The format for your next appointment:   In Person  Provider:   Cherlynn Kaiser, MD  Other Instructions Recommend  mediterranean diet, increase fiber intake    Mediterranean Diet A Mediterranean diet refers to food and lifestyle choices that are based on the traditions of countries located on the [employer]. This way of eating has been shown to help prevent certain conditions and improve outcomes for people who have chronic diseases, like kidney disease and heart disease. What are tips for following this plan? Lifestyle  Cook and eat meals together with your family, when possible.  Drink enough fluid to keep your urine clear or pale yellow.  Be physically active every day. This includes: ? Aerobic exercise like running or swimming. ? Leisure activities like gardening, walking, or housework.  Get 7-8 hours of sleep each night.  If recommended by your health care provider, drink red wine in moderation. This means 1 glass a day for nonpregnant women and 2 glasses a day for men. A glass of wine equals 5 oz (150 mL). Reading food labels   Check the serving size of packaged foods. For foods such as rice and pasta, the serving size refers to the amount of cooked product, not dry.  Check the total fat in packaged  foods. Avoid foods that have saturated fat or trans fats.  Check the ingredients list for added sugars, such as corn syrup. Shopping  At the grocery store, buy most of your food from the areas near the walls of the store. This includes: ? Fresh fruits and vegetables (produce). ? Grains, beans, nuts, and seeds. Some of these may be available in unpackaged forms or large amounts (in bulk). ? Fresh seafood. ? Poultry and eggs. ? Low-fat dairy products.  Buy whole ingredients instead of prepackaged foods.  Buy fresh fruits and vegetables in-season from local farmers markets.  Buy frozen fruits and vegetables in resealable bags.  If you do not have access to quality fresh seafood, buy precooked frozen shrimp or canned fish, such as tuna, salmon, or sardines.  Buy small amounts of raw or cooked vegetables, salads, or olives from the deli or salad bar at your store.  Stock your pantry so you always have certain foods on hand, such as olive oil, canned tuna, canned tomatoes, rice, pasta, and beans. Cooking  Cook foods with extra-virgin olive oil instead of using butter or other vegetable oils.  Have meat as a side dish, and have vegetables or grains as your main dish. This means having meat in small portions or adding small amounts of meat to foods like pasta or stew.  Use beans or vegetables instead of meat in common dishes like chili or lasagna.  Experiment with different cooking methods. Try roasting or broiling vegetables instead of steaming or sauteing them.  Add frozen vegetables to soups, stews, pasta,  or rice.  Add nuts or seeds for added healthy fat at each meal. You can add these to yogurt, salads, or vegetable dishes.  Marinate fish or vegetables using olive oil, lemon juice, garlic, and fresh herbs. Meal planning   Plan to eat 1 vegetarian meal one day each week. Try to work up to 2 vegetarian meals, if possible.  Eat seafood 2 or more times a week.  Have healthy  snacks readily available, such as: ? Vegetable sticks with hummus. ? [place] yogurt. ? Fruit and nut trail mix.  Eat balanced meals throughout the week. This includes: ? Fruit: 2-3 servings a day ? Vegetables: 4-5 servings a day ? Low-fat dairy: 2 servings a day ? Fish, poultry, or lean meat: 1 serving a day ? Beans and legumes: 2 or more servings a week ? Nuts and seeds: 1-2 servings a day ? Whole grains: 6-8 servings a day ? Extra-virgin olive oil: 3-4 servings a day  Limit red meat and sweets to only a few servings a month What are my food choices?  Mediterranean diet ? Recommended  Grains: Whole-grain pasta. Brown rice. Bulgar wheat. Polenta. Couscous. Whole-wheat bread. [place].  Vegetables: Artichokes. Beets. Broccoli. Cabbage. Carrots. Eggplant. Green beans. Chard. Kale. Spinach. Onions. Leeks. Peas. Squash. Tomatoes. Peppers. Radishes.  Fruits: Apples. Apricots. Avocado. Berries. Bananas. Cherries. Dates. Figs. Grapes. Lemons. Melon. Oranges. Peaches. Plums. Pomegranate.  Meats and other protein foods: Beans. Almonds. Sunflower seeds. Pine nuts. Peanuts. [place]. Salmon. Scallops. Shrimp. Orchid. Tilapia. Clams. Oysters. Eggs.  Dairy: Low-fat milk. Cheese. Greek yogurt.  Beverages: Water. Red wine. Herbal tea.  Fats and oils: Extra virgin olive oil. Avocado oil. Grape seed oil.  Sweets and desserts: [place] yogurt with honey. Baked apples. Poached pears. Trail mix.  Seasoning and other foods: Basil. Cilantro. Coriander. Cumin. Mint. Parsley. Sage. Rosemary. Tarragon. Garlic. Oregano. Thyme. Pepper. Balsalmic vinegar. Tahini. Hummus. Tomato sauce. Olives. Mushrooms. ? Limit these  Grains: Prepackaged pasta or rice dishes. Prepackaged cereal with added sugar.  Vegetables: Deep fried potatoes (french fries).  Fruits: Fruit canned in syrup.  Meats and other protein foods: Beef. Pork. Lamb. Poultry with skin. Hot dogs. Bernie[place] Salines.  Dairy: Ice cream. Sour cream. Whole  milk.  Beverages: Juice. Sugar-sweetened soft drinks. Beer. Liquor and spirits.  Fats and oils: Butter. Canola oil. Vegetable oil. Beef fat (tallow). Lard.  Sweets and desserts: Cookies. Cakes. Pies. Candy.  Seasoning and other foods: Mayonnaise. [place]ade sauces and marinades. The items listed may not be a complete list. Talk with your dietitian about what dietary choices are right for you. Summary  The Mediterranean diet includes both food and lifestyle choices.  Eat a variety of fresh fruits and vegetables, beans, nuts, seeds, and whole grains.  Limit the amount of red meat and sweets that you eat.  Talk with your health care provider about whether it is safe for you to drink red wine in moderation. This means 1 glass a day for nonpregnant women and 2 glasses a day for men. A glass of wine equals 5 oz (150 mL). This information is not intended to replace advice given to you by your health care provider. Make sure you discuss any questions you have with your health care provider. Document Revised: 04/13/2016 Document Reviewed: 04/06/2016 Elsevier Patient Education  [place].

## 2019-10-21 NOTE — Progress Notes (Signed)
Virtual Visit via Telephone Note   This visit type was conducted due to national recommendations for restrictions regarding the COVID-19 Pandemic (e.g. social distancing) in an effort to limit this patient's exposure and mitigate transmission in our community.  Due to her co-morbid illnesses, this patient is at least at moderate risk for complications without adequate follow up.  This format is felt to be most appropriate for this patient at this time.  The patient did not have access to video technology/had technical difficulties with video requiring transitioning to audio format only (telephone).  All issues noted in this document were discussed and addressed.  No physical exam could be performed with this format.  Please refer to the patient's chart for her  consent to telehealth for [facility].   Date:  10/21/2019   ID:  Jodi Cline, DOB 05/25/1941, MRN [id number]  Patient Location: Home Provider Location: Home  PCP:  Lavone Orn, MD  Cardiologist:  Elouise Munroe, MD  Electrophysiologist:  None   Evaluation Performed:  Follow-Up Visit  Chief Complaint:  F/u PAD, HLD, LBBB  History of Present Illness:    Jodi Cline is a 79 y.o. female with hyperlipidemia, hypertension, squamous cell carcinomas and basal cell carcinomas of the skin, who presents today for follow up of PAD, and hyperlipidemia.   Seen by Dr. Fletcher Anon, started cilostazol with symptom improvement. Walked for an hour at [employer] without significant symptoms. It has helped the pain in her feet.  Feeling well, tolerating statin increase well. Echo and lipid panel reviewed.     The patient does not have symptoms concerning for COVID-19 infection (fever, chills, cough, or new shortness of breath).    Past Medical History:  Diagnosis Date  . BCC (basal cell carcinoma) 04/18/2005   Right mid thigh (CX35FU)  . BCC (basal cell carcinoma) 04/07/2009   right shoulder blade (Cx3)  . Hyperlipidemia   . Hypertension    . Insomnia   . Nodular basal cell carcinoma (BCC) 08/05/2013   right shoulder blade (EXC)  . SCC (squamous cell carcinoma) 02/19/2001   left arm (CX35FU)  . SCC (squamous cell carcinoma) 04/23/2013   left shin (txpbx)  . SCC (squamous cell carcinoma) 08/05/2013   left shin (txpbx)  . SCC (squamous cell carcinoma) 05/07/2014   Left shin (txpbx)  . SCC (squamous cell carcinoma) 08/04/2014   left shin (MOHS)  . Superficial basal cell carcinoma (BCC) 07/13/2015   right thigh (tx p bx)   Past Surgical History:  Procedure Laterality Date  . CESAREAN SECTION    . THORACIC SPINE SURGERY     Dr. Tonita Cong  . TUBAL LIGATION       Current Meds  Medication Sig  . amLODipine (NORVASC) 5 MG tablet Take 1 tablet by mouth daily.  Marland Kitchen aspirin EC 81 MG tablet Take 1 tablet (81 mg total) by mouth daily.  . cilostazol (PLETAL) 50 MG tablet Take 1 tablet (50 mg total) by mouth 2 (two) times daily.  . hydrochlorothiazide (HYDRODIURIL) 25 MG tablet Take 1 tablet by mouth daily.  Marland Kitchen oxybutynin (DITROPAN-XL) 5 MG 24 hr tablet Take 5 mg by mouth at bedtime. Take 1/2 to 1 tablet daily  . potassium chloride (K-DUR,KLOR-CON) 10 MEQ tablet Take 10 mEq by mouth daily.  . Probiotic Product (PROBIOTIC-10 PO) Take by mouth.  . rosuvastatin (CRESTOR) 20 MG tablet Take 1 tablet (20 mg total) by mouth daily.  Marland Kitchen zolpidem (AMBIEN) 10 MG tablet Take 1 tablet by  mouth at bedtime.     Allergies:   Tramadol, Benazepril, and Cephalexin   Social History   Tobacco Use  . Smoking status: Former Research scientist (life sciences)  . Smokeless tobacco: Never Used  Substance Use Topics  . Alcohol use: Not on file  . Drug use: Not on file     Family Hx: The patient's family history is not on file.  ROS:   Please see the history of present illness.     All other systems reviewed and are negative.   Prior CV studies:   The following studies were reviewed today:    Labs/Other Tests and Data Reviewed:    EKG:  No ECG reviewed.  Recent  Labs: No results found for requested labs within last 8760 hours.   Recent Lipid Panel Lab Results  Component Value Date/Time   CHOL 228 (H) 09/30/2019 09:50 AM   TRIG 105 09/30/2019 09:50 AM   HDL 95 09/30/2019 09:50 AM   CHOLHDL 2.4 09/30/2019 09:50 AM   LDLCALC 115 (H) 09/30/2019 09:50 AM    Wt Readings from Last 3 Encounters:  10/21/19 157 lb (71.2 kg)  09/30/19 157 lb 6.4 oz (71.4 kg)  09/26/19 158 lb 9.6 oz (71.9 kg)     Objective:    Vital Signs:  Ht 5' 6.5" (1.689 m)   Wt 157 lb (71.2 kg)   BMI 24.96 kg/m    VITAL SIGNS:  reviewed GEN:  no acute distress NEURO:  alert and oriented x 3, no obvious focal deficit PSYCH:  normal affect  ASSESSMENT & PLAN:    1. PAD (peripheral artery disease) ([place])   2. Essential hypertension   3. Hyperlipidemia LDL goal <70   4. LBBB (left bundle branch block)    PAD - symptoms improved on cilostazol, continued to encourage walking. Increased dose of statin well tolerated.  HTN - continue amlodipine, hctz  HLD - continue crestor 20 mg daily. Repeat labs in 3 mo.  LBBB - echo without wall motion abnormalities.  COVID-19 Education: The signs and symptoms of COVID-19 were discussed with the patient and how to seek care for testing (follow up with PCP or arrange E-visit).  The importance of social distancing was discussed today.  Time:   Today, I have spent 25 minutes with the patient with telehealth technology discussing the above problems.     Medication Adjustments/Labs and Tests Ordered: Current medicines are reviewed at length with the patient today.  Concerns regarding medicines are outlined above.   Tests Ordered: No orders of the defined types were placed in this encounter.   Medication Changes: No orders of the defined types were placed in this encounter.   Follow Up:  3 mo  Signed, Elouise Munroe, MD  10/21/2019 2:23 PM    [place]

## 2019-10-21 NOTE — Telephone Encounter (Signed)
RN spoke to patient. Instruction were given  from today's virtual visit 10/21/19 .    AVS SUMMARY has been sent by mychart as well as requested to mail copy - done follow up appointment schedule for May 19,2021.   Patient verbalized understanding

## 2019-10-28 DIAGNOSIS — Z961 Presence of intraocular lens: Secondary | ICD-10-CM | POA: Diagnosis not present

## 2019-10-28 DIAGNOSIS — H5203 Hypermetropia, bilateral: Secondary | ICD-10-CM | POA: Diagnosis not present

## 2019-10-28 DIAGNOSIS — H40001 Preglaucoma, unspecified, right eye: Secondary | ICD-10-CM | POA: Diagnosis not present

## 2019-10-28 DIAGNOSIS — H40053 Ocular hypertension, bilateral: Secondary | ICD-10-CM | POA: Diagnosis not present

## 2019-10-31 ENCOUNTER — Ambulatory Visit: Payer: Medicare HMO | Attending: Internal Medicine

## 2019-10-31 DIAGNOSIS — Z23 Encounter for immunization: Secondary | ICD-10-CM | POA: Insufficient documentation

## 2019-10-31 NOTE — Progress Notes (Signed)
   Covid-19 Vaccination Clinic  Name:  Jodi Cline    MRN: [id number] DOB: Jan 03, 1941  10/31/2019  [name] was observed post Covid-19 immunization for 15 minutes without incident. She was provided with Vaccine Information Sheet and instruction to access the V-Safe system.   [name] was instructed to call 911 with any severe reactions post vaccine: Marland Kitchen Difficulty breathing  . Swelling of face and throat  . A fast heartbeat  . A bad rash all over body  . Dizziness and weakness   Immunizations Administered    Name Date Dose VIS Date Route   Pfizer COVID-19 Vaccine 10/31/2019 10:47 AM 0.3 mL 08/08/2019 Intramuscular   Manufacturer: [place]   Lot: [id number]   [place]: [id number]

## 2019-11-12 ENCOUNTER — Other Ambulatory Visit: Payer: Self-pay

## 2019-11-12 MED ORDER — CILOSTAZOL 50 MG PO TABS: 50.0000 mg | ORAL_TABLET | Freq: Two times a day (BID) | ORAL | 0 refills | Status: DC

## 2019-11-22 DIAGNOSIS — M5030 Other cervical disc degeneration, unspecified cervical region: Secondary | ICD-10-CM | POA: Diagnosis not present

## 2019-11-22 DIAGNOSIS — M25512 Pain in left shoulder: Secondary | ICD-10-CM | POA: Diagnosis not present

## 2019-12-10 DIAGNOSIS — Z86010 Personal history of colonic polyps: Secondary | ICD-10-CM | POA: Diagnosis not present

## 2019-12-10 DIAGNOSIS — Z800 Family history of malignant neoplasm of digestive organs: Secondary | ICD-10-CM | POA: Diagnosis not present

## 2019-12-10 DIAGNOSIS — K921 Melena: Secondary | ICD-10-CM | POA: Diagnosis not present

## 2019-12-22 DIAGNOSIS — I739 Peripheral vascular disease, unspecified: Secondary | ICD-10-CM | POA: Diagnosis not present

## 2019-12-22 DIAGNOSIS — E7800 Pure hypercholesterolemia, unspecified: Secondary | ICD-10-CM | POA: Diagnosis not present

## 2019-12-22 DIAGNOSIS — I10 Essential (primary) hypertension: Secondary | ICD-10-CM | POA: Diagnosis not present

## 2019-12-24 ENCOUNTER — Ambulatory Visit: Payer: Medicare HMO | Admitting: Orthopedic Surgery

## 2019-12-30 ENCOUNTER — Ambulatory Visit: Payer: Medicare HMO | Admitting: Cardiovascular Disease

## 2019-12-30 ENCOUNTER — Other Ambulatory Visit: Payer: Self-pay

## 2019-12-30 ENCOUNTER — Encounter: Payer: Self-pay | Admitting: Cardiovascular Disease

## 2019-12-30 VITALS — BP 137/73 | HR 78 | Ht 66.0 in | Wt 156.2 lb

## 2019-12-30 DIAGNOSIS — E785 Hyperlipidemia, unspecified: Secondary | ICD-10-CM

## 2019-12-30 DIAGNOSIS — I739 Peripheral vascular disease, unspecified: Secondary | ICD-10-CM | POA: Diagnosis not present

## 2019-12-30 DIAGNOSIS — I10 Essential (primary) hypertension: Secondary | ICD-10-CM | POA: Diagnosis not present

## 2019-12-30 NOTE — Progress Notes (Signed)
Cardiology Office Note   Date:  12/30/2019   ID:  Jodi Cline, DOB 1941-06-30, MRN [id number]  PCP:  Lavone Orn, MD  Cardiologist:   Kathlyn Sacramento, MD   No chief complaint on file.     History of Present Illness: Jodi Cline is a 79 y.o. female who is here today for follow-up visit regarding peripheral arterial disease.   She has known history of hyperlipidemia, previous tobacco use, essential hypertension and squamous cell carcinoma of the skin. She has prolonged history of plantar fasciitis but recently complained of bilateral leg pain that limits her ability to walk.   She was seen few months ago for bilateral calf and foot pain with walking.   ABI was normal bilaterally.  However, duplex showed one-vessel runoff below the knee on the right side via the peroneal artery.  On the left, the proximal popliteal artery was ectatic measuring 1 cm with one-vessel runoff below the knee via the anterior tibial artery which was stenosed significantly in the distal segment. She underwent a subsequent aortic iliac duplex which showed no evidence of aortic aneurysm.  During last visit, I started the patient on cilostazol 50 mg twice daily.  She reports significant improvement in symptoms since then.  Past Medical History:  Diagnosis Date  . BCC (basal cell carcinoma) 04/18/2005   Right mid thigh (CX35FU)  . BCC (basal cell carcinoma) 04/07/2009   right shoulder blade (Cx3)  . Hyperlipidemia   . Hypertension   . Insomnia   . Nodular basal cell carcinoma (BCC) 08/05/2013   right shoulder blade (EXC)  . SCC (squamous cell carcinoma) 02/19/2001   left arm (CX35FU)  . SCC (squamous cell carcinoma) 04/23/2013   left shin (txpbx)  . SCC (squamous cell carcinoma) 08/05/2013   left shin (txpbx)  . SCC (squamous cell carcinoma) 05/07/2014   Left shin (txpbx)  . SCC (squamous cell carcinoma) 08/04/2014   left shin (MOHS)  . Superficial basal cell carcinoma (BCC) 07/13/2015   right thigh  (tx p bx)    Past Surgical History:  Procedure Laterality Date  . CESAREAN SECTION    . THORACIC SPINE SURGERY     Dr. Tonita Cong  . TUBAL LIGATION       Current Outpatient Medications  Medication Sig Dispense Refill  . amLODipine (NORVASC) 5 MG tablet Take 1 tablet by mouth daily.    Marland Kitchen aspirin EC 81 MG tablet Take 1 tablet (81 mg total) by mouth daily. 90 tablet 3  . calcium carbonate (TUMS - DOSED IN MG ELEMENTAL CALCIUM) 500 MG chewable tablet Chew by mouth as needed.     . cilostazol (PLETAL) 50 MG tablet Take 1 tablet (50 mg total) by mouth 2 (two) times daily. 180 tablet 0  . hydrochlorothiazide (HYDRODIURIL) 25 MG tablet Take 1 tablet by mouth daily.    Marland Kitchen oxybutynin (DITROPAN-XL) 5 MG 24 hr tablet Take 5 mg by mouth at bedtime. Take 1/2 to 1 tablet daily    . potassium chloride (K-DUR,KLOR-CON) 10 MEQ tablet Take 10 mEq by mouth daily.    . Probiotic Product (PROBIOTIC-10 PO) Take by mouth.    . zolpidem (AMBIEN) 10 MG tablet Take 1 tablet by mouth at bedtime.    . rosuvastatin (CRESTOR) 20 MG tablet Take 1 tablet (20 mg total) by mouth daily. 90 tablet 3   No current facility-administered medications for this visit.    Allergies:   Tramadol, Benazepril, and Cephalexin    Social History:  The patient  reports that she has quit smoking. She has never used smokeless tobacco.   Family History:  The patient's family history is remarkable for aortic aneurysm affecting her grandfather.  No cardiac history.   ROS:  Please see the history of present illness.   Otherwise, review of systems are positive for none.   All other systems are reviewed and negative.    PHYSICAL EXAM: VS:  BP 137/73   Pulse 78   Ht 5\' 6"  (1.676 m)   Wt 156 lb 3.2 oz (70.9 kg)   SpO2 94%   BMI 25.21 kg/m  , BMI Body mass index is 25.21 kg/m. GEN: Well nourished, well developed, in no acute distress  HEENT: normal  Neck: no JVD, carotid bruits, or masses Cardiac: RRR; no murmurs, rubs, or gallops,no  edema  Respiratory:  clear to auscultation bilaterally, normal work of breathing GI: soft, nontender, nondistended, + BS MS: no deformity or atrophy  Skin: warm and dry, no rash Neuro:  Strength and sensation are intact Psych: euthymic mood, full affect Vascular: Femoral pulses normal bilaterally.  Dorsalis pedis is +2 on the right and +1 on the left.  Posterior tibial is not palpable  EKG:  EKG is not ordered today.    Recent Labs: No results found for requested labs within last 8760 hours.    Lipid Panel    Component Value Date/Time   CHOL 228 (H) 09/30/2019 0950   TRIG 105 09/30/2019 0950   HDL 95 09/30/2019 0950   CHOLHDL 2.4 09/30/2019 0950   LDLCALC 115 (H) 09/30/2019 0950      Wt Readings from Last 3 Encounters:  12/30/19 156 lb 3.2 oz (70.9 kg)  10/21/19 157 lb (71.2 kg)  09/30/19 157 lb 6.4 oz (71.4 kg)       No flowsheet data found.    ASSESSMENT AND PLAN:  1.  Peripheral arterial disease: The patient has bilateral foot claudication worse on the left side with no evidence of critical limb ischemia.  She reports improvement in symptoms with small dose cilostazol.  I asked her to start a walking program of 15 to 30 minutes daily now that her symptoms are improved.  Angiography can be considered for worsening symptoms.  2.  Abdominal aortic aneurysm screening: Ultrasound showed no evidence of abdominal aortic aneurysm.  3.  Essential hypertension: Blood pressure is controlled on current medications.  4.  Hyperlipidemia: Continue treatment with rosuvastatin with a target LDL of less than 70.  5.  Left bundle branch block: Echocardiogram showed normal LV systolic function and wall motion.   Disposition:   FU with me in 6 months  Signed,  Kathlyn Sacramento, MD  12/30/2019 11:25 AM    [place]

## 2019-12-30 NOTE — Patient Instructions (Signed)

## 2020-01-01 DIAGNOSIS — K625 Hemorrhage of anus and rectum: Secondary | ICD-10-CM | POA: Diagnosis not present

## 2020-01-01 DIAGNOSIS — Z800 Family history of malignant neoplasm of digestive organs: Secondary | ICD-10-CM | POA: Diagnosis not present

## 2020-01-01 DIAGNOSIS — K5520 Angiodysplasia of colon without hemorrhage: Secondary | ICD-10-CM | POA: Diagnosis not present

## 2020-01-01 DIAGNOSIS — K5730 Diverticulosis of large intestine without perforation or abscess without bleeding: Secondary | ICD-10-CM | POA: Diagnosis not present

## 2020-01-01 DIAGNOSIS — K621 Rectal polyp: Secondary | ICD-10-CM | POA: Diagnosis not present

## 2020-01-01 DIAGNOSIS — D128 Benign neoplasm of rectum: Secondary | ICD-10-CM | POA: Diagnosis not present

## 2020-01-01 DIAGNOSIS — Z1211 Encounter for screening for malignant neoplasm of colon: Secondary | ICD-10-CM | POA: Diagnosis not present

## 2020-01-01 DIAGNOSIS — Z86010 Personal history of colonic polyps: Secondary | ICD-10-CM | POA: Diagnosis not present

## 2020-01-14 ENCOUNTER — Ambulatory Visit: Payer: Medicare HMO | Admitting: Internal Medicine

## 2020-01-14 DIAGNOSIS — I10 Essential (primary) hypertension: Secondary | ICD-10-CM | POA: Diagnosis not present

## 2020-01-14 DIAGNOSIS — K5520 Angiodysplasia of colon without hemorrhage: Secondary | ICD-10-CM | POA: Diagnosis not present

## 2020-01-14 DIAGNOSIS — K5790 Diverticulosis of intestine, part unspecified, without perforation or abscess without bleeding: Secondary | ICD-10-CM | POA: Diagnosis not present

## 2020-01-14 DIAGNOSIS — D128 Benign neoplasm of rectum: Secondary | ICD-10-CM | POA: Diagnosis not present

## 2020-02-05 ENCOUNTER — Ambulatory Visit: Payer: Medicare HMO | Admitting: Internal Medicine

## 2020-02-13 DIAGNOSIS — I10 Essential (primary) hypertension: Secondary | ICD-10-CM | POA: Diagnosis not present

## 2020-02-13 DIAGNOSIS — R911 Solitary pulmonary nodule: Secondary | ICD-10-CM | POA: Diagnosis not present

## 2020-02-13 DIAGNOSIS — I700 Atherosclerosis of aorta: Secondary | ICD-10-CM | POA: Diagnosis not present

## 2020-02-13 DIAGNOSIS — J439 Emphysema, unspecified: Secondary | ICD-10-CM | POA: Diagnosis not present

## 2020-02-17 ENCOUNTER — Ambulatory Visit: Payer: Medicare HMO | Admitting: Internal Medicine

## 2020-03-02 ENCOUNTER — Other Ambulatory Visit: Payer: Self-pay

## 2020-03-02 ENCOUNTER — Other Ambulatory Visit: Payer: Self-pay | Admitting: Cardiovascular Disease

## 2020-03-02 ENCOUNTER — Encounter: Payer: Self-pay | Admitting: Internal Medicine

## 2020-03-02 ENCOUNTER — Ambulatory Visit: Payer: Medicare HMO | Admitting: Internal Medicine

## 2020-03-02 VITALS — BP 136/71 | HR 75 | Ht 66.0 in | Wt 159.6 lb

## 2020-03-02 DIAGNOSIS — E785 Hyperlipidemia, unspecified: Secondary | ICD-10-CM | POA: Diagnosis not present

## 2020-03-02 DIAGNOSIS — I739 Peripheral vascular disease, unspecified: Secondary | ICD-10-CM

## 2020-03-02 DIAGNOSIS — I10 Essential (primary) hypertension: Secondary | ICD-10-CM

## 2020-03-02 DIAGNOSIS — I447 Left bundle-branch block, unspecified: Secondary | ICD-10-CM | POA: Diagnosis not present

## 2020-03-02 NOTE — Patient Instructions (Signed)
Medication Instructions:  Your Physician recommend you continue on your current medication as directed.   *If you need a refill on your cardiac medications before your next appointment, please call your pharmacy*   Lab Work: None    Testing/Procedures: None   Follow-Up: At [facility], you and your health needs are our priority.  As part of our continuing mission to provide you with exceptional heart care, we have created designated Provider Care Teams.  These Care Teams include your primary Cardiologist (physician) and Advanced Practice Providers (APPs -  Physician Assistants and Nurse Practitioners) who all work together to provide you with the care you need, when you need it.  We recommend signing up for the patient portal called "MyChart".  Sign up information is provided on this After Visit Summary.  MyChart is used to connect with patients for Virtual Visits (Telemedicine).  Patients are able to view lab/test results, encounter notes, upcoming appointments, etc.  Non-urgent messages can be sent to your provider as well.   To learn more about what you can do with MyChart, go to [email or web address].    Your next appointment:   6 month(s)  The format for your next appointment:   In Person  Provider:   Gayatri Acharya, MD     

## 2020-03-02 NOTE — Progress Notes (Signed)
Cardiology Office Note:    Date:  03/02/2020   ID:  Jodi Cline, DOB Oct 08, 1940, MRN [id number]  PCP:  Lavone Orn, MD  Cardiologist:  Elouise Munroe, MD  Electrophysiologist:  None   Referring MD: Lavone Orn, MD   Chief Complaint: Follow-up PAD, hyperlipidemia, LBBB  History of Present Illness:    Jodi Cline is a 79 y.o. female with a history of hyperlipidemia, hypertension, squamous cell carcinomas and basal cell carcinomas of the skin, who presents today for follow up of PAD, and hyperlipidemia.   Continues to do well on cilostazol started by Dr. Fletcher Anon.  She can generally walk for an hour without symptoms of claudication.  She does have pain in her feet bilaterally which responds to ibuprofen 2-3 times a week.  We discussed chronic NSAID use.  She is tolerating statin therapy.  She tells me that she feels cold all the time.  Denies hair loss or weight gain.  Abnormalities, no lab studies to review at this time.  This is a longstanding symptom.  We discussed reviewing with primary care and potentially repeating thyroid labs at her annual appointment this fall.  Dr. Laurann Montana recently started her on valsartan 160 mg HCTZ 12.5 mg.  Blood pressure is well controlled and she is tolerating this well.  She has resting left calf discomfort.  We discussed her recent evaluation for peripheral arterial disease.  We will observe this symptom, and I have encouraged her to call me or Dr. Fletcher Anon if this worsens.  Past Medical History:  Diagnosis Date  . BCC (basal cell carcinoma) 04/18/2005   Right mid thigh (CX35FU)  . BCC (basal cell carcinoma) 04/07/2009   right shoulder blade (Cx3)  . Hyperlipidemia   . Hypertension   . Insomnia   . Nodular basal cell carcinoma (BCC) 08/05/2013   right shoulder blade (EXC)  . SCC (squamous cell carcinoma) 02/19/2001   left arm (CX35FU)  . SCC (squamous cell carcinoma) 04/23/2013   left shin (txpbx)  . SCC (squamous cell carcinoma) 08/05/2013    left shin (txpbx)  . SCC (squamous cell carcinoma) 05/07/2014   Left shin (txpbx)  . SCC (squamous cell carcinoma) 08/04/2014   left shin (MOHS)  . Superficial basal cell carcinoma (BCC) 07/13/2015   right thigh (tx p bx)    Past Surgical History:  Procedure Laterality Date  . CESAREAN SECTION    . THORACIC SPINE SURGERY     Dr. Tonita Cong  . TUBAL LIGATION      Current Medications: Current Meds  Medication Sig  . amLODipine (NORVASC) 5 MG tablet Take 1 tablet by mouth daily.  Marland Kitchen aspirin EC 81 MG tablet Take 1 tablet (81 mg total) by mouth daily.  . calcium carbonate (TUMS - DOSED IN MG ELEMENTAL CALCIUM) 500 MG chewable tablet Chew by mouth as needed.   Marland Kitchen oxybutynin (DITROPAN-XL) 5 MG 24 hr tablet Take 5 mg by mouth at bedtime. Take 1/2 to 1 tablet daily  . potassium chloride (K-DUR,KLOR-CON) 10 MEQ tablet Take 10 mEq by mouth daily.  . Probiotic Product (PROBIOTIC-10 PO) Take by mouth.  . rosuvastatin (CRESTOR) 20 MG tablet Take 1 tablet (20 mg total) by mouth daily.  . valsartan-hydrochlorothiazide (DIOVAN-HCT) 160-12.5 MG tablet Take 1 tablet by mouth daily.  Marland Kitchen zolpidem (AMBIEN) 10 MG tablet Take 1 tablet by mouth at bedtime.  . [DISCONTINUED] cilostazol (PLETAL) 50 MG tablet Take 1 tablet (50 mg total) by mouth 2 (two) times daily.  . [DISCONTINUED]  hydrochlorothiazide (HYDRODIURIL) 25 MG tablet Take 1 tablet by mouth daily.     Allergies:   Tramadol, Benazepril, and Cephalexin   Social History   Socioeconomic History  . Marital status: Married    Spouse name: Not on file  . Number of children: Not on file  . Years of education: Not on file  . Highest education level: Not on file  Occupational History  . Not on file  Tobacco Use  . Smoking status: Former Research scientist (life sciences)  . Smokeless tobacco: Never Used  Vaping Use  . Vaping Use: Never used  Substance and Sexual Activity  . Alcohol use: Not on file  . Drug use: Not on file  . Sexual activity: Not on file  Other Topics  Concern  . Not on file  Social History Narrative  . Not on file   Social Determinants of Health   Financial Resource Strain:   . Difficulty of Paying Living Expenses:   Food Insecurity:   . Worried About Charity fundraiser in the Last Year:   . Arboriculturist in the Last Year:   Transportation Needs:   . Film/video editor (Medical):   Marland Kitchen Lack of Transportation (Non-Medical):   Physical Activity:   . Days of Exercise per Week:   . Minutes of Exercise per Session:   Stress:   . Feeling of Stress :   Social Connections:   . Frequency of Communication with Friends and Family:   . Frequency of Social Gatherings with Friends and Family:   . Attends Religious Services:   . Active Member of Clubs or Organizations:   . Attends Archivist Meetings:   Marland Kitchen Marital Status:      Family History: The patient's family history is not on file.  ROS:   Please see the history of present illness.    All other systems reviewed and are negative.  EKGs/Labs/Other Studies Reviewed:    The following studies were reviewed today:  EKG: N/A  Recent Labs: No results found for requested labs within last 8760 hours.  Recent Lipid Panel    Component Value Date/Time   CHOL 228 (H) 09/30/2019 0950   TRIG 105 09/30/2019 0950   HDL 95 09/30/2019 0950   CHOLHDL 2.4 09/30/2019 0950   LDLCALC 115 (H) 09/30/2019 0950    Physical Exam:    VS:  BP 136/71   Pulse 75   Ht 5\' 6"  (1.676 m)   Wt 159 lb 9.6 oz (72.4 kg)   SpO2 99%   BMI 25.76 kg/m     Wt Readings from Last 5 Encounters:  03/02/20 159 lb 9.6 oz (72.4 kg)  12/30/19 156 lb 3.2 oz (70.9 kg)  10/21/19 157 lb (71.2 kg)  09/30/19 157 lb 6.4 oz (71.4 kg)  09/26/19 158 lb 9.6 oz (71.9 kg)     Constitutional: No acute distress Eyes: sclera non-icteric, normal conjunctiva and lids ENMT: Mask in place Cardiovascular: regular rhythm, normal rate, no murmurs. S1 and S2 normal. Radial pulses normal bilaterally. No jugular  venous distention.  Respiratory: clear to auscultation bilaterally GI : normal bowel sounds, soft and nontender. No distention.   MSK: extremities warm, well perfused. No edema.  NEURO: grossly nonfocal exam, moves all extremities. PSYCH: alert and oriented x 3, normal mood and affect.   ASSESSMENT:    1. PAD (peripheral artery disease) ([place])   2. Hyperlipidemia, unspecified hyperlipidemia type   3. Essential hypertension   4. LBBB (left bundle  branch block)    PLAN:    PAD (peripheral artery disease) ([facility]) -Stable, tolerating cilostazol.  Continue aspirin 81 mg daily, Crestor 20 mg daily.  Hyperlipidemia, unspecified hyperlipidemia type-continue Crestor 20 mg daily.  Lipids can be repeated at her next primary care appointment this fall.  Essential hypertension-continue valsartan HCTZ 160-12.5 mg.  LBBB (left bundle branch block)-no abnormalities on echocardiogram.  No chest pain.  Total time of encounter: 30 minutes total time of encounter, including 20 minutes spent in face-to-face patient care on the date of this encounter. This time includes coordination of care and counseling regarding above mentioned problem list. Remainder of non-face-to-face time involved reviewing chart documents/testing relevant to the patient encounter and documentation in the medical record. I have independently reviewed documentation from referring provider.   Cherlynn Kaiser, MD [place]  [facility]    Medication Adjustments/Labs and Tests Ordered: Current medicines are reviewed at length with the patient today.  Concerns regarding medicines are outlined above.  No orders of the defined types were placed in this encounter.  No orders of the defined types were placed in this encounter.   Patient Instructions  Medication Instructions:  Your Physician recommend you continue on your current medication as directed.    *If you need a refill on your cardiac medications before your next  appointment, please call your pharmacy*   Lab Work: None   Testing/Procedures: None   Follow-Up: At [facility], you and your health needs are our priority.  As part of our continuing mission to provide you with exceptional heart care, we have created designated Provider Care Teams.  These Care Teams include your primary Cardiologist (physician) and Advanced Practice Providers (APPs -  Physician Assistants and Nurse Practitioners) who all work together to provide you with the care you need, when you need it.  We recommend signing up for the patient portal called "MyChart".  Sign up information is provided on this After Visit Summary.  MyChart is used to connect with patients for Virtual Visits (Telemedicine).  Patients are able to view lab/test results, encounter notes, upcoming appointments, etc.  Non-urgent messages can be sent to your provider as well.   To learn more about what you can do with MyChart, go to [email or web address].    Your next appointment:   6 month(s)  The format for your next appointment:   In Person  Provider:   Cherlynn Kaiser, MD

## 2020-03-25 ENCOUNTER — Encounter: Payer: Self-pay | Admitting: Podiatry

## 2020-03-25 ENCOUNTER — Other Ambulatory Visit: Payer: Self-pay

## 2020-03-25 ENCOUNTER — Ambulatory Visit: Payer: Medicare HMO | Admitting: Podiatry

## 2020-03-25 DIAGNOSIS — I999 Unspecified disorder of circulatory system: Secondary | ICD-10-CM | POA: Diagnosis not present

## 2020-03-25 DIAGNOSIS — B351 Tinea unguium: Secondary | ICD-10-CM | POA: Diagnosis not present

## 2020-03-25 DIAGNOSIS — M79674 Pain in right toe(s): Secondary | ICD-10-CM | POA: Diagnosis not present

## 2020-03-25 DIAGNOSIS — M79675 Pain in left toe(s): Secondary | ICD-10-CM | POA: Diagnosis not present

## 2020-03-25 NOTE — Progress Notes (Signed)
Subjective:   Patient ID: Jodi Cline, female   DOB: 79 y.o.   MRN: [id number]   HPI Patient states she had vascular studies done and they may try to do procedure in future but at this point she started medicine which is providing some benefit.  Patient has incurvated nailbeds 1-5 both feet and the left hallux especially is sore   ROS      Objective:  Physical Exam  Patient with significant distal vascular disease with mycotic painful nailbeds 1-5 both feet that are thick     Assessment:  Painful nail disease 1-5 both feet with at risk vascular disease     Plan:  H&P reviewed the condition and vascular disease and we discussed different treatments including continuation medicines dependency and increased exercise for the area.  I debrided nailbeds 1-5 both feet and we will keep her on a careful watch and if any breakdown of tissue were to occur she is to let us and her vascular doctor know right away and I educated her on this today

## 2020-04-07 ENCOUNTER — Encounter: Payer: Self-pay | Admitting: Orthopedic Surgery

## 2020-04-07 ENCOUNTER — Ambulatory Visit: Payer: Medicare HMO | Admitting: Orthopedic Surgery

## 2020-04-07 DIAGNOSIS — M7061 Trochanteric bursitis, right hip: Secondary | ICD-10-CM

## 2020-04-07 NOTE — Progress Notes (Signed)
Office Visit Note   Patient: Jodi Cline           Date of Birth: July 06, 1941           MRN: [id number] Visit Date: 04/07/2020 Requested by: Lavone Orn, MD [address] [place],  [place] [address] PCP: Lavone Orn, MD  Subjective: Chief Complaint  Patient presents with  . Right Hip - Pain    HPI: Jodi Cline is a patient with right hip pain on and off for a month.  Denies any history of injury.  Denies any radicular pain.  She reports occasional buttock pain but no groin pain.  She is actually not hurting today.  Sleeps on the left-hand side.  Right-hand side okay.  No radiating pain down the leg and no back pain.  The pain does not wake her from sleep at night.  She does not really want to get x-rays today because she is not hurting today.              ROS: All systems reviewed are negative as they relate to the chief complaint within the history of present illness.  Patient denies  fevers or chills.   Assessment & Plan: Visit Diagnoses:  1. Trochanteric bursitis, right hip     Plan: Impression is focal trochanteric pain with no radicular component no groin symptoms.  Looks like trochanteric bursitis.  I think that she should try some stretching and take anti-inflammatories and if she still hurting in a month come back we will do x-rays and and possible an injection.  No indication for further work-up at this time.  Follow-Up Instructions: Return if symptoms worsen or fail to improve.   Orders:  No orders of the defined types were placed in this encounter.  No orders of the defined types were placed in this encounter.     Procedures: No procedures performed   Clinical Data: No additional findings.  Objective: Vital Signs: There were no vitals taken for this visit.  Physical Exam:   Constitutional: Patient appears well-developed HEENT:  Head: Normocephalic Eyes:EOM are normal Neck: Normal range of motion Cardiovascular: Normal rate Pulmonary/chest:  Effort normal Neurologic: Patient is alert Skin: Skin is warm Psychiatric: Patient has normal mood and affect    Ortho Exam: Ortho exam demonstrates full active and passive range of motion of both hips.  Excellent abduction adduction and hip flexion strength.  No groin pain on the right or left inside with internal extra rotation of the leg.  She does have mild trochanteric tenderness to direct palpation but no lymphadenopathy in this region.  Normal gait alignment with no swelling in the trochanteric region on the right  Specialty Comments:  No specialty comments available.  Imaging: No results found.   PMFS History: Patient Active Problem List   Diagnosis Date Noted  . Greater trochanteric pain syndrome of left lower extremity 10/31/2017  . Retrocalcaneal bursitis (back of heel), left 10/31/2017  . Benign essential HTN 09/27/2017  . Benign neoplasm of colon 09/27/2017  . Cannot sleep 09/27/2017  . Constipation by outlet dysfunction 09/27/2017  . Hematuria syndrome 09/27/2017  . HLD (hyperlipidemia) 09/27/2017  . Radiculitis 09/27/2017   Past Medical History:  Diagnosis Date  . BCC (basal cell carcinoma) 04/18/2005   Right mid thigh (CX35FU)  . BCC (basal cell carcinoma) 04/07/2009   right shoulder blade (Cx3)  . Hyperlipidemia   . Hypertension   . Insomnia   . Nodular basal cell carcinoma (BCC) 08/05/2013  right shoulder blade (EXC)  . SCC (squamous cell carcinoma) 02/19/2001   left arm (CX35FU)  . SCC (squamous cell carcinoma) 04/23/2013   left shin (txpbx)  . SCC (squamous cell carcinoma) 08/05/2013   left shin (txpbx)  . SCC (squamous cell carcinoma) 05/07/2014   Left shin (txpbx)  . SCC (squamous cell carcinoma) 08/04/2014   left shin (MOHS)  . Superficial basal cell carcinoma (BCC) 07/13/2015   right thigh (tx p bx)    History reviewed. No pertinent family history.  Past Surgical History:  Procedure Laterality Date  . CESAREAN SECTION    . THORACIC  SPINE SURGERY     Dr. Tonita Cong  . TUBAL LIGATION     Social History   Occupational History  . Not on file  Tobacco Use  . Smoking status: Former Research scientist (life sciences)  . Smokeless tobacco: Never Used  Vaping Use  . Vaping Use: Never used  Substance and Sexual Activity  . Alcohol use: Not on file  . Drug use: Not on file  . Sexual activity: Not on file

## 2020-04-08 DIAGNOSIS — R197 Diarrhea, unspecified: Secondary | ICD-10-CM | POA: Diagnosis not present

## 2020-04-13 DIAGNOSIS — R197 Diarrhea, unspecified: Secondary | ICD-10-CM | POA: Diagnosis not present

## 2020-04-21 DIAGNOSIS — H40053 Ocular hypertension, bilateral: Secondary | ICD-10-CM | POA: Diagnosis not present

## 2020-04-21 DIAGNOSIS — H0011 Chalazion right upper eyelid: Secondary | ICD-10-CM | POA: Diagnosis not present

## 2020-04-26 ENCOUNTER — Telehealth: Payer: Self-pay | Admitting: Orthopedic Surgery

## 2020-04-26 NOTE — Telephone Encounter (Signed)
FYI  I called patient and advised I did not have anything available on Wednesday.  Appt made for Thursday morning. Patient is out of town and can hardly walk. She will be back Tuesday evening and was hopeful to get in Wednesday. I advised I could hold message to try and keep a check for cancellations and call her.

## 2020-04-26 NOTE — Telephone Encounter (Signed)
No appt available for Wednesday at this time. Will hold message to see if cancellation.

## 2020-04-26 NOTE — Telephone Encounter (Signed)
Patient called advised her Rt hip and Rt leg is hurting her pretty bad. Patient asked if she can be worked into Dr. Forbes Cellar schedule Wednesday. Patient said she can't put much weight down on her leg. The number to contact patient is [phone]

## 2020-04-28 NOTE — Telephone Encounter (Signed)
Holding for cancellation today if available.

## 2020-04-29 ENCOUNTER — Ambulatory Visit (INDEPENDENT_AMBULATORY_CARE_PROVIDER_SITE_OTHER): Payer: Medicare HMO

## 2020-04-29 ENCOUNTER — Ambulatory Visit: Payer: Medicare HMO | Admitting: Orthopedic Surgery

## 2020-04-29 DIAGNOSIS — M79604 Pain in right leg: Secondary | ICD-10-CM | POA: Diagnosis not present

## 2020-04-29 MED ORDER — PREDNISONE 5 MG (21) PO TBPK: ORAL_TABLET | ORAL | 0 refills | Status: DC

## 2020-04-29 MED ORDER — METHOCARBAMOL 500 MG PO TABS: 500.0000 mg | ORAL_TABLET | Freq: Three times a day (TID) | ORAL | 0 refills | Status: DC | PRN

## 2020-05-02 ENCOUNTER — Encounter: Payer: Self-pay | Admitting: Orthopedic Surgery

## 2020-05-02 NOTE — Progress Notes (Signed)
Office Visit Note   Patient: Jodi Cline           Date of Birth: 1940-12-23           MRN: [id number] Visit Date: 04/29/2020 Requested by: Jodi Cline PCP: Jodi Cline  Subjective: Chief Complaint  Patient presents with  . Right Leg - Pain    HPI: Jodi Cline is a 79 year old patient with significant right leg and low back pain.  Seen a month ago.  Symptoms have been ongoing for at least 6 weeks.  She has been doing stretching and taking anti-inflammatories.  The pain runs down the leg to the anterior tibia from the lateral hip.  Denies any numbness and tingling.  She does report some weakness.  Burning aching type pain.  She does have a history of one back surgery many years ago.  The pain does wake her from sleep at night.  Symptoms are worsening.  She does have pain with coughing and sneezing.              ROS: All systems reviewed are negative as they relate to the chief complaint within the history of present illness.  Patient denies  fevers or chills.   Assessment & Plan: Visit Diagnoses:  1. Pain in right leg     Plan: Impression is right-sided radiculopathy.  Plan is Medrol Dosepak muscle relaxer and MRI scan of the lumbar spine to evaluate radicular symptoms.  This does not look like trochanteric bursitis.  Follow-Up Instructions: Return for after MRI.   Orders:  Orders Placed This Encounter  Procedures  . XR HIP UNILAT W OR W/O PELVIS 2-3 VIEWS RIGHT  . XR Lumbar Spine 2-3 Views  . MR Lumbar Spine w/o contrast   Meds ordered this encounter  Medications  . predniSONE (STERAPRED UNI-PAK 21 TAB) 5 MG (21) TBPK tablet    Sig: Take dosepak as directed    Dispense:  21 tablet    Refill:  0  . methocarbamol (ROBAXIN) 500 MG tablet    Sig: Take 1 tablet (500 mg total) by mouth every 8 (eight) hours as needed for muscle spasms.    Dispense:  30 tablet    Refill:  0      Procedures: No procedures  performed   Clinical Data: No additional findings.  Objective: Vital Signs: There were no vitals taken for this visit.  Physical Exam:   Constitutional: Patient appears well-developed HEENT:  Head: Normocephalic Eyes:EOM are normal Neck: Normal range of motion Cardiovascular: Normal rate Pulmonary/chest: Effort normal Neurologic: Patient is alert Skin: Skin is warm Psychiatric: Patient has normal mood and affect    Ortho Exam: Ortho exam demonstrates full active and passive range of motion of the hips knees and ankles.  No nerve root tension signs today.  No discrete tenderness in the trochanteric region.  Ankle dorsiflexion plantarflexion strength 5+ out of 5 along with quad hamstring strength as well as hip flexion strength.  No groin pain with internal extra rotation of either leg.  She does have some mild pain with forward lateral bending.  No masses skin changes or rashes noted on the back region.  Specialty Comments:  No specialty comments available.  Imaging: No results found.   PMFS History: Patient Active Problem List   Diagnosis Date Noted  . Greater trochanteric pain syndrome of left lower extremity 10/31/2017  . Retrocalcaneal bursitis (back of heel), left 10/31/2017  .  Benign essential HTN 09/27/2017  . Benign neoplasm of colon 09/27/2017  . Cannot sleep 09/27/2017  . Constipation by outlet dysfunction 09/27/2017  . Hematuria syndrome 09/27/2017  . HLD (hyperlipidemia) 09/27/2017  . Radiculitis 09/27/2017   Past Medical History:  Diagnosis Date  . BCC (basal cell carcinoma) 04/18/2005   Right mid thigh (CX35FU)  . BCC (basal cell carcinoma) 04/07/2009   right shoulder blade (Cx3)  . Hyperlipidemia   . Hypertension   . Insomnia   . Nodular basal cell carcinoma (BCC) 08/05/2013   right shoulder blade (EXC)  . SCC (squamous cell carcinoma) 02/19/2001   left arm (CX35FU)  . SCC (squamous cell carcinoma) 04/23/2013   left shin (txpbx)  . SCC  (squamous cell carcinoma) 08/05/2013   left shin (txpbx)  . SCC (squamous cell carcinoma) 05/07/2014   Left shin (txpbx)  . SCC (squamous cell carcinoma) 08/04/2014   left shin (MOHS)  . Superficial basal cell carcinoma (BCC) 07/13/2015   right thigh (tx p bx)    History reviewed. No pertinent family history.  Past Surgical History:  Procedure Laterality Date  . CESAREAN SECTION    . THORACIC SPINE SURGERY     Jodi Cline  . TUBAL LIGATION     Social History   Occupational History  . Not on file  Tobacco Use  . Smoking status: Former Research scientist (life sciences)  . Smokeless tobacco: Never Used  Vaping Use  . Vaping Use: Never used  Substance and Sexual Activity  . Alcohol use: Not on file  . Drug use: Not on file  . Sexual activity: Not on file

## 2020-05-04 ENCOUNTER — Other Ambulatory Visit: Payer: Self-pay | Admitting: Surgical

## 2020-05-04 ENCOUNTER — Telehealth: Payer: Self-pay | Admitting: Orthopedic Surgery

## 2020-05-04 MED ORDER — ACETAMINOPHEN-CODEINE #3 300-30 MG PO TABS: 1.0000 | ORAL_TABLET | Freq: Three times a day (TID) | ORAL | 0 refills | Status: DC | PRN

## 2020-05-04 NOTE — Telephone Encounter (Signed)
Ok for t 3 1 po q 8 # 35 thx

## 2020-05-04 NOTE — Telephone Encounter (Signed)
Is this something you could help with?

## 2020-05-04 NOTE — Telephone Encounter (Signed)
submitted

## 2020-05-04 NOTE — Telephone Encounter (Signed)
Can you please submit this? 

## 2020-05-04 NOTE — Telephone Encounter (Signed)
See below.  Patient advised of MRI auth and appointment. She is asking for something for pain until her MRI is complete. She took her last prednisone this morning.  Please advise. Thanks.

## 2020-05-04 NOTE — Telephone Encounter (Signed)
Order placed on 04/29/20. Authorization had not been worked up yet. I will start on this.

## 2020-05-04 NOTE — Telephone Encounter (Signed)
Pt called stating she was supposed to have an MRI but the paperwork hasn't been sent to her insurance company; pt would like to have this done so she can proceed with the MRI. Pt would like a CB   [phone]

## 2020-05-04 NOTE — Telephone Encounter (Signed)
Confirmation Date: May 04 2020 - Jun 03 2020 [employer] Number: [id number]  Authorization is noted and patient has appointment with [facility] on 9/24.

## 2020-05-05 DIAGNOSIS — H40013 Open angle with borderline findings, low risk, bilateral: Secondary | ICD-10-CM | POA: Diagnosis not present

## 2020-05-05 DIAGNOSIS — H40053 Ocular hypertension, bilateral: Secondary | ICD-10-CM | POA: Diagnosis not present

## 2020-05-05 DIAGNOSIS — H01004 Unspecified blepharitis left upper eyelid: Secondary | ICD-10-CM | POA: Diagnosis not present

## 2020-05-05 DIAGNOSIS — H01001 Unspecified blepharitis right upper eyelid: Secondary | ICD-10-CM | POA: Diagnosis not present

## 2020-05-11 ENCOUNTER — Telehealth: Payer: Self-pay | Admitting: Orthopedic Surgery

## 2020-05-11 DIAGNOSIS — K8681 Exocrine pancreatic insufficiency: Secondary | ICD-10-CM | POA: Diagnosis not present

## 2020-05-11 DIAGNOSIS — K5903 Drug induced constipation: Secondary | ICD-10-CM | POA: Diagnosis not present

## 2020-05-11 NOTE — Telephone Encounter (Signed)
She can take the medication every 6-8 hours as she needs to.  Don't want to prescribe a stronger narcotic, hopefully taking it more often can help.  MRI scheduled for tomorrow it looks like

## 2020-05-11 NOTE — Telephone Encounter (Signed)
Patient called advised the Tylenol 3 and Methocarbamol is not working. Patient said the medication only last about 3 hours. Patient said she can not get anything done because of the pain she is in. Patient asked if Dr. Marlou Sa can up the dosage of the medication. The number to contact patient is [phone]

## 2020-05-11 NOTE — Telephone Encounter (Signed)
I left voicemail for patient advising. 

## 2020-05-11 NOTE — Telephone Encounter (Signed)
Can you please advise?

## 2020-05-12 ENCOUNTER — Ambulatory Visit
Admission: RE | Admit: 2020-05-12 | Discharge: 2020-05-12 | Disposition: A | Discharge status: Home / Self Care | Payer: Medicare HMO | Source: Ambulatory Visit | Attending: Orthopedic Surgery | Admitting: Orthopedic Surgery

## 2020-05-12 ENCOUNTER — Other Ambulatory Visit: Payer: Self-pay

## 2020-05-12 DIAGNOSIS — M79604 Pain in right leg: Secondary | ICD-10-CM

## 2020-05-12 DIAGNOSIS — M48061 Spinal stenosis, lumbar region without neurogenic claudication: Secondary | ICD-10-CM | POA: Diagnosis not present

## 2020-05-14 ENCOUNTER — Telehealth: Payer: Self-pay | Admitting: Orthopedic Surgery

## 2020-05-14 ENCOUNTER — Other Ambulatory Visit: Payer: Self-pay | Admitting: Surgical

## 2020-05-14 MED ORDER — GABAPENTIN 300 MG PO CAPS: 300.0000 mg | ORAL_CAPSULE | Freq: Three times a day (TID) | ORAL | 0 refills | Status: DC

## 2020-05-14 MED ORDER — HYDROCODONE-ACETAMINOPHEN 5-325 MG PO TABS: 1.0000 | ORAL_TABLET | Freq: Three times a day (TID) | ORAL | 0 refills | Status: DC | PRN

## 2020-05-14 MED ORDER — METHOCARBAMOL 500 MG PO TABS: 500.0000 mg | ORAL_TABLET | Freq: Three times a day (TID) | ORAL | 0 refills | Status: DC | PRN

## 2020-05-14 NOTE — Telephone Encounter (Signed)
Please advise 

## 2020-05-14 NOTE — Telephone Encounter (Signed)
Patient called requesting a stronger prescription. Patient states that tylenol 3 and muscle relaxer's are not touching the pain. Patient states she can barely move. Please call patient when new medication has been called into pharmacy on file. Patient phone number is [phone].

## 2020-05-15 ENCOUNTER — Encounter: Payer: Self-pay | Admitting: Surgical

## 2020-05-16 NOTE — Telephone Encounter (Signed)
Called and discussed over the weekend

## 2020-05-19 ENCOUNTER — Ambulatory Visit: Payer: Medicare HMO | Admitting: Orthopedic Surgery

## 2020-05-19 ENCOUNTER — Telehealth: Payer: Self-pay | Admitting: Radiology

## 2020-05-19 ENCOUNTER — Encounter: Payer: Self-pay | Admitting: Orthopedic Surgery

## 2020-05-19 DIAGNOSIS — M79604 Pain in right leg: Secondary | ICD-10-CM

## 2020-05-19 NOTE — Progress Notes (Signed)
Office Visit Note   Patient: Jodi Cline           Date of Birth: [birth year]           MRN: [id number] Visit Date: 05/19/2020 Requested by: Lavone Orn, MD [address] [place],  [place] [address] PCP: Lavone Orn, MD  Subjective: Chief Complaint  Patient presents with   scan review    HPI: Jodi Cline is a 79 year old patient with low back pain.  Medications helped her.  Last saw her about a month ago.  MRI scan is reviewed and she does have a right-sided L4-5 synovial cyst measuring 7 mm.  It does appear to be contacting the nerve.  She is having right-sided pain worse with ambulation.  States that her pain hurts initially with the first few steps.  It can improve some with activity but she cannot do any heavy lifting.              ROS: All systems reviewed are negative as they relate to the chief complaint within the history of present illness.  Patient denies  fevers or chills.   Assessment & Plan: Visit Diagnoses:  1. Pain in right leg     Plan: Impression is radiculopathy right leg from likely enlarging synovial cyst at L4-5.  I would like for her to have an aspiration and injection performed around that cyst if possible.  Referral to Dr. Ernestina Patches made.  Follow-up with me as needed  Follow-Up Instructions: No follow-ups on file.   Orders:  No orders of the defined types were placed in this encounter.  No orders of the defined types were placed in this encounter.     Procedures: No procedures performed   Clinical Data: No additional findings.  Objective: Vital Signs: There were no vitals taken for this visit.  Physical Exam:   Constitutional: Patient appears well-developed HEENT:  Head: Normocephalic Eyes:EOM are normal Neck: Normal range of motion Cardiovascular: Normal rate Pulmonary/chest: Effort normal Neurologic: Patient is alert Skin: Skin is warm Psychiatric: Patient has normal mood and affect    Ortho Exam: Ortho exam demonstrates  full active and passive range of motion of both knees hips and ankles.  No nerve root tension signs.  No weakness with ankle dorsiflexion plantarflexion quad hamstring strength testing.  Does have pain with forward lateral bending but no trochanteric tenderness.  Specialty Comments:  No specialty comments available.  Imaging: No results found.   PMFS History: Patient Active Problem List   Diagnosis Date Noted   Greater trochanteric pain syndrome of left lower extremity 10/31/2017   Retrocalcaneal bursitis (back of heel), left 10/31/2017   Benign essential HTN 09/27/2017   Benign neoplasm of colon 09/27/2017   Cannot sleep 09/27/2017   Constipation by outlet dysfunction 09/27/2017   Hematuria syndrome 09/27/2017   HLD (hyperlipidemia) 09/27/2017   Radiculitis 09/27/2017   Past Medical History:  Diagnosis Date   BCC (basal cell carcinoma) 04/18/2005   Right mid thigh (CX35FU)   BCC (basal cell carcinoma) 04/07/2009   right shoulder blade (Cx3)   Hyperlipidemia    Hypertension    Insomnia    Nodular basal cell carcinoma (BCC) 08/05/2013   right shoulder blade (EXC)   SCC (squamous cell carcinoma) 02/19/2001   left arm (CX35FU)   SCC (squamous cell carcinoma) 04/23/2013   left shin (txpbx)   SCC (squamous cell carcinoma) 08/05/2013   left shin (txpbx)   SCC (squamous cell carcinoma) 05/07/2014   Left  shin (txpbx)   SCC (squamous cell carcinoma) 08/04/2014   left shin (MOHS)   Superficial basal cell carcinoma (BCC) 07/13/2015   right thigh (tx p bx)    History reviewed. No pertinent family history.  Past Surgical History:  Procedure Laterality Date   CESAREAN SECTION     THORACIC SPINE SURGERY     Dr. Tonita Cong   TUBAL LIGATION     Social History   Occupational History   Not on file  Tobacco Use   Smoking status: Former Smoker   Smokeless tobacco: Never Used  Scientific laboratory technician Use: Never used  Substance and Sexual Activity    Alcohol use: Not on file   Drug use: Not on file   Sexual activity: Not on file

## 2020-05-19 NOTE — Telephone Encounter (Signed)
I can probably get her worked in next week, but with [employer] we would need to try to get prior authorization. Let me know if you want [place] to try for auth.

## 2020-05-19 NOTE — Telephone Encounter (Signed)
Patient was seen in the office today and Dr. Marlou Sa would like for her to have a Right L4-5 facet injection "synovial cyst" sooner rather than later.  Are you able to get the patient worked in anytime soon?  If not, could you please send back to me so that we can try [facility]? Thanks.

## 2020-05-19 NOTE — Telephone Encounter (Signed)
Can you try to get auth for a right L4-5 facet injection? Looks like they just put a referral in also. Let me know if it is authorized and I will try to work her in.

## 2020-05-19 NOTE — Telephone Encounter (Signed)
Please work on authorization. I have entered referral. Thanks.

## 2020-05-20 NOTE — Telephone Encounter (Signed)
Pt has been approve. [id number]

## 2020-05-21 ENCOUNTER — Other Ambulatory Visit: Payer: Medicare HMO

## 2020-05-22 ENCOUNTER — Other Ambulatory Visit: Payer: Self-pay | Admitting: Surgical

## 2020-05-24 ENCOUNTER — Ambulatory Visit: Payer: Medicare HMO | Admitting: Orthopedic Surgery

## 2020-05-24 ENCOUNTER — Telehealth: Payer: Self-pay | Admitting: Physical Medicine and Rehabilitation

## 2020-05-24 ENCOUNTER — Other Ambulatory Visit: Payer: Self-pay | Admitting: Surgical

## 2020-05-24 ENCOUNTER — Other Ambulatory Visit: Payer: Self-pay | Admitting: Physician Assistant

## 2020-05-24 NOTE — Telephone Encounter (Signed)
Patient called.   She is requesting a call to schedule an appointment with Dr.Newton for an injection   Call back: [phone]

## 2020-05-24 NOTE — Telephone Encounter (Signed)
Scheduled

## 2020-05-24 NOTE — Telephone Encounter (Signed)
Looks like she was supposed to be referred to Dr. Ernestina Patches but I dont see referral in her last note or appointment in chart, can you make sure she is referred? Thanks!

## 2020-05-25 ENCOUNTER — Ambulatory Visit: Payer: Self-pay

## 2020-05-25 ENCOUNTER — Encounter: Payer: Self-pay | Admitting: Physical Medicine and Rehabilitation

## 2020-05-25 ENCOUNTER — Ambulatory Visit: Payer: Medicare HMO | Admitting: Physical Medicine and Rehabilitation

## 2020-05-25 ENCOUNTER — Other Ambulatory Visit: Payer: Self-pay

## 2020-05-25 VITALS — BP 145/77 | HR 86

## 2020-05-25 DIAGNOSIS — M47816 Spondylosis without myelopathy or radiculopathy, lumbar region: Secondary | ICD-10-CM | POA: Diagnosis not present

## 2020-05-25 MED ORDER — METHYLPREDNISOLONE ACETATE 80 MG/ML IJ SUSP
80.0000 mg | Freq: Once | INTRAMUSCULAR | Status: AC
  Administered 2020-05-25: 80 mg

## 2020-05-25 NOTE — Progress Notes (Signed)
Pt states right hip pain that travels to her right knee. Pt state climbing stair and bending makes the pain worse. Pt state use icy pack, sitting to rest and pain meds helps ease the pain.  Numeric Pain Rating Scale and Functional Assessment Average Pain 4   In the last MONTH (on 0-10 scale) has pain interfered with the following?  1. General activity like being  able to carry out your everyday physical activities such as walking, climbing stairs, carrying groceries, or moving a chair?  Rating(9)   +Driver, -BT, -Dye Allergies.

## 2020-05-27 NOTE — Procedures (Signed)
Lumbar Facet Joint Intra-Articular Injection(s) with Fluoroscopic Guidance  Patient: Jodi Cline      Date of Birth: 03/07/1941 MRN: [id number] PCP: Lavone Orn, MD      Visit Date: 05/25/2020   Universal Protocol:    Date/Time: 05/25/2020  Consent Given By: the patient  Position: PRONE   Additional Comments: Vital signs were monitored before and after the procedure. Patient was prepped and draped in the usual sterile fashion. The correct patient, procedure, and site was verified.   Injection Procedure Details:  Procedure Site One Meds Administered:  Meds ordered this encounter  Medications  . methylPREDNISolone acetate (DEPO-MEDROL) injection 80 mg     Laterality: Right  Location/Site:  L4-L5  Needle size: 22 guage  Needle type: Spinal  Needle Placement: Articular  Findings:  -Comments: Excellent flow of contrast producing a partial arthrogram.  0.5 mL of normal-appearing joint fluid was aspirated.  Procedure Details: The fluoroscope beam is vertically oriented in AP, and the inferior recess is visualized beneath the lower pole of the inferior apophyseal process, which represents the target point for needle insertion. When direct visualization is difficult the target point is located at the medial projection of the vertebral pedicle. The region overlying each aforementioned target is locally anesthetized with a 1 to 2 ml. volume of 1% Lidocaine without Epinephrine.   The spinal needle was inserted into each of the above mentioned facet joints using biplanar fluoroscopic guidance. A 0.25 to 0.5 ml. volume of Isovue-250 was injected and a partial facet joint arthrogram was obtained. A single spot film was obtained of the resulting arthrogram.    One to 1.25 ml of the steroid/anesthetic solution was then injected into each of the facet joints noted above.   Additional Comments:  The patient tolerated the procedure well Dressing: 2 x 2 sterile gauze and Band-Aid     Post-procedure details: Patient was observed during the procedure. Post-procedure instructions were reviewed.  Patient left the clinic in stable condition.

## 2020-05-27 NOTE — Progress Notes (Signed)
Jodi Cline - 79 y.o. female MRN [id number]  Date of birth: 02/29/1941  Office Visit Note: Visit Date: 05/25/2020 PCP: Lavone Orn, MD Referred by: Lavone Orn, MD  Subjective: Chief Complaint  Patient presents with  . Right Hip - Pain  . Right Knee - Pain   HPI:  Jodi Cline is a 79 y.o. female who comes in today at the request of Dr. Anderson Malta for planned Right L4-L5 Lumbar facet/medial branch block with fluoroscopic guidance.  The patient has failed conservative care including home exercise, medications, time and activity modification.  This injection will be diagnostic and hopefully therapeutic.  Please see requesting physician notes for further details and justification.  Exam has shown concordant pain with facet joint loading.  MRI reviewed with images and spine model.  MRI reviewed in the note below.  This shows significant facet arthropathy at L4-5 with facet joint cyst on the right protruding medially probably impacting the lateral recess.  We are going to try facet joint intra-articular injection with aspiration.  Depending on relief would look at epidural injection.   ROS Otherwise per HPI.  Assessment & Plan: Visit Diagnoses:  1. Spondylosis without myelopathy or radiculopathy, lumbar region     Plan: No additional findings.   Meds & Orders:  Meds ordered this encounter  Medications  . methylPREDNISolone acetate (DEPO-MEDROL) injection 80 mg    Orders Placed This Encounter  Procedures  . Facet Injection  . XR C-ARM NO REPORT    Follow-up: Return if symptoms worsen or fail to improve.   Procedures: No procedures performed  Lumbar Facet Joint Intra-Articular Injection(s) with Fluoroscopic Guidance  Patient: Jodi Cline      Date of Birth: 1941/08/05 MRN: [id number] PCP: Lavone Orn, MD      Visit Date: 05/25/2020   Universal Protocol:    Date/Time: 05/25/2020  Consent Given By: the patient  Position: PRONE   Additional Comments: Vital  signs were monitored before and after the procedure. Patient was prepped and draped in the usual sterile fashion. The correct patient, procedure, and site was verified.   Injection Procedure Details:  Procedure Site One Meds Administered:  Meds ordered this encounter  Medications  . methylPREDNISolone acetate (DEPO-MEDROL) injection 80 mg     Laterality: Right  Location/Site:  L4-L5  Needle size: 22 guage  Needle type: Spinal  Needle Placement: Articular  Findings:  -Comments: Excellent flow of contrast producing a partial arthrogram.  0.5 mL of normal-appearing joint fluid was aspirated.  Procedure Details: The fluoroscope beam is vertically oriented in AP, and the inferior recess is visualized beneath the lower pole of the inferior apophyseal process, which represents the target point for needle insertion. When direct visualization is difficult the target point is located at the medial projection of the vertebral pedicle. The region overlying each aforementioned target is locally anesthetized with a 1 to 2 ml. volume of 1% Lidocaine without Epinephrine.   The spinal needle was inserted into each of the above mentioned facet joints using biplanar fluoroscopic guidance. A 0.25 to 0.5 ml. volume of Isovue-250 was injected and a partial facet joint arthrogram was obtained. A single spot film was obtained of the resulting arthrogram.    One to 1.25 ml of the steroid/anesthetic solution was then injected into each of the facet joints noted above.   Additional Comments:  The patient tolerated the procedure well Dressing: 2 x 2 sterile gauze and Band-Aid    Post-procedure details: Patient was  observed during the procedure. Post-procedure instructions were reviewed.  Patient left the clinic in stable condition.      Clinical History: MRI LUMBAR SPINE WITHOUT CONTRAST  TECHNIQUE: Multiplanar, multisequence MR imaging of the lumbar spine was performed. No intravenous  contrast was administered.  COMPARISON:  Radiography 04/29/2020  FINDINGS: Segmentation:  5 lumbar type vertebral bodies.  Alignment:  Normal  Vertebrae:  Normal  Conus medullaris and cauda equina: Conus extends to the T12-L1 level. Conus and cauda equina appear normal.  Paraspinal and other soft tissues: Normal  Disc levels:  Minimal non-compressive disc bulges at T11-12 and T12-L1.  L1-2, L2-3 and L3-4 do not show any disc pathology. Minimal facet hypertrophy without stenosis.  L4-5: Bilateral facet osteoarthritis with facet and ligamentous hypertrophy. Synovial cysts, including a 7 mm synovial cyst projecting inward from the facet on the right. Bulging of the disc. Stenosis of the lateral recesses, right more than left, because of the synovial cyst. Right L5 nerve compression quite possible in this location. The facet arthropathy shows edematous change, which could be correlated with low back pain or referred facet syndrome pain.  L5-S1: No disc abnormality. Minimal facet osteoarthritis. No stenosis.  IMPRESSION: At L4-5, there is bilateral facet arthropathy with facet and ligamentous hypertrophy. Synovial cyst measuring 7 mm projecting inward from the facet on the right. Stenosis of the lateral recesses, right more than left, because of the synovial cyst. Right L5 nerve compression quite possible in this location. The facet arthropathy shows edematous change, which could be correlated with low back pain or referred facet syndrome pain.   Electronically Signed   By: Nelson Chimes M.D.   On: 05/12/2020 10:30     Objective:  VS:  HT:    WT:   BMI:     BP:(!) 145/77  HR:86bpm  TEMP: ( )  RESP:  Physical Exam Constitutional:      General: She is not in acute distress.    Appearance: Normal appearance. She is not ill-appearing.  HENT:     Head: Normocephalic and atraumatic.     Right Ear: External ear normal.     Left Ear: External ear  normal.  Eyes:     Extraocular Movements: Extraocular movements intact.  Cardiovascular:     Rate and Rhythm: Normal rate.     Pulses: Normal pulses.  Musculoskeletal:     Right lower leg: No edema.     Left lower leg: No edema.     Comments: Patient has good distal strength with no pain over the greater trochanters.  No clonus or focal weakness. Patient somewhat slow to rise from a seated position to full extension.  There is concordant low back pain with facet loading and lumbar spine extension rotation.  There are no definitive trigger points but the patient is somewhat tender across the lower back and PSIS.  There is no pain with hip rotation.   Skin:    Findings: No erythema, lesion or rash.  Neurological:     General: No focal deficit present.     Mental Status: She is alert and oriented to person, place, and time.     Sensory: No sensory deficit.     Motor: No weakness or abnormal muscle tone.     Coordination: Coordination normal.  Psychiatric:        Mood and Affect: Mood normal.        Behavior: Behavior normal.      Imaging: No results found.

## 2020-05-31 ENCOUNTER — Telehealth: Payer: Self-pay | Admitting: Physical Medicine and Rehabilitation

## 2020-05-31 ENCOUNTER — Telehealth: Payer: Self-pay | Admitting: Cardiovascular Disease

## 2020-05-31 NOTE — Telephone Encounter (Signed)
Called patient and advised on options and offered OV vs OV with ESI this week. Patient is going to wait a little while longer to see if her pain improves. She will call us back if she wants to schedule.

## 2020-05-31 NOTE — Telephone Encounter (Signed)
Patient called advised the injection did not work and her right calf hurt so bad when she get up. Patient said she has to limp when she go the bath room. Patient asked if Dr Ernestina Patches would call her. The number to contact patient is [phone]

## 2020-05-31 NOTE — Telephone Encounter (Signed)
Returned the call to the patient. She stated that she recently had a MRI completed that showed a cyst in the L4 and L5 region. She recently had a pain injection and some of the cyst drained. She stated that the pain has eased in her hip area but she is still having right leg pain from her knee to her ankle. This pain gets worse on ambulation.  An appointment has been made with Dr. Fletcher Anon for tomorrow 10/5

## 2020-05-31 NOTE — Telephone Encounter (Signed)
Sounds good

## 2020-05-31 NOTE — Telephone Encounter (Signed)
° °  Pt would like to speak with Dr. Tyrell Antonio nurse.

## 2020-05-31 NOTE — Telephone Encounter (Signed)
Patient had right L4-5 facet injection on 9/28. Please advise.

## 2020-05-31 NOTE — Telephone Encounter (Signed)
As we discussed she has facet joint cyst impacting the nerve at L4-5. If facet joint aspiration not helpful then only options are L5 tf esi (maybe interlam) or surgical decompression. We can ov quicker if needed or do ESI and talk. She is getting pain from the nerve root.

## 2020-06-01 ENCOUNTER — Encounter: Payer: Self-pay | Admitting: Cardiovascular Disease

## 2020-06-01 ENCOUNTER — Telehealth: Payer: Self-pay | Admitting: Physical Medicine and Rehabilitation

## 2020-06-01 ENCOUNTER — Ambulatory Visit: Payer: Medicare HMO | Admitting: Cardiovascular Disease

## 2020-06-01 ENCOUNTER — Other Ambulatory Visit: Payer: Self-pay

## 2020-06-01 VITALS — BP 150/70 | HR 75 | Ht 66.0 in | Wt 157.8 lb

## 2020-06-01 DIAGNOSIS — I739 Peripheral vascular disease, unspecified: Secondary | ICD-10-CM | POA: Diagnosis not present

## 2020-06-01 DIAGNOSIS — E785 Hyperlipidemia, unspecified: Secondary | ICD-10-CM | POA: Diagnosis not present

## 2020-06-01 DIAGNOSIS — I10 Essential (primary) hypertension: Secondary | ICD-10-CM

## 2020-06-01 NOTE — Telephone Encounter (Signed)
Pt called wanting to set an appt with Dr.Newton   [phone]

## 2020-06-01 NOTE — Telephone Encounter (Signed)
Is auth needed for [id number] or [phone]? Unsure which he might do per his last message. Patient is scheduled for 10/7 (per Dr. Ernestina Patches - work in this week.)

## 2020-06-01 NOTE — Progress Notes (Signed)
Cardiology Office Note   Date:  06/01/2020   ID:  Jodi Cline, DOB 1940/09/15, MRN [id number]  PCP:  Lavone Orn, MD  Cardiologist:   Kathlyn Sacramento, MD   No chief complaint on file.     History of Present Illness: Jodi Cline is a 79 y.o. female who is here today for follow-up visit regarding peripheral arterial disease.   She has known history of hyperlipidemia, previous tobacco use, essential hypertension and squamous cell carcinoma of the skin. She has prolonged history of plantar fasciitis  The patient is followed for peripheral arterial disease with claudication.  Doppler studies done in January 2021 showed normal ABI bilaterally.  However, duplex showed one-vessel runoff below the knee on the right side via the peroneal artery.  On the left, the proximal popliteal artery was ectatic measuring 1 cm with one-vessel runoff below the knee via the anterior tibial artery which was stenosed significantly in the distal segment. Aortoiliac duplex showed no evidence of aortic aneurysm or significant iliac disease.  The patient was treated with cilostazol 50 mg twice daily with improvement in symptoms.  She developed recent lower back pain.  MRI showed L4-L5 arthropathy with 7 mm synovial cyst projecting inward to the right side.  There was evidence of right L5 nerve compression.  She underwent steroid back injection with some improvement but then started having pain in the lateral side of the right leg from the ankle to the knee.  Thus, she was concerned about possible worsening of peripheral arterial disease and requested to be seen.  The pain happens at rest or with activities but more pronounced at rest.  No lower extremity ulceration.  No symptoms on the left side.   Past Medical History:  Diagnosis Date  . BCC (basal cell carcinoma) 04/18/2005   Right mid thigh (CX35FU)  . BCC (basal cell carcinoma) 04/07/2009   right shoulder blade (Cx3)  . Hyperlipidemia   . Hypertension    . Insomnia   . Nodular basal cell carcinoma (BCC) 08/05/2013   right shoulder blade (EXC)  . SCC (squamous cell carcinoma) 02/19/2001   left arm (CX35FU)  . SCC (squamous cell carcinoma) 04/23/2013   left shin (txpbx)  . SCC (squamous cell carcinoma) 08/05/2013   left shin (txpbx)  . SCC (squamous cell carcinoma) 05/07/2014   Left shin (txpbx)  . SCC (squamous cell carcinoma) 08/04/2014   left shin (MOHS)  . Superficial basal cell carcinoma (BCC) 07/13/2015   right thigh (tx p bx)    Past Surgical History:  Procedure Laterality Date  . CESAREAN SECTION    . THORACIC SPINE SURGERY     Dr. Tonita Cong  . TUBAL LIGATION       Current Outpatient Medications  Medication Sig Dispense Refill  . acetaminophen-codeine (TYLENOL #3) 300-30 MG tablet Take 1 tablet by mouth every 12 (twelve) hours as needed for moderate pain. 30 tablet 0  . amLODipine (NORVASC) 5 MG tablet Take 1 tablet by mouth daily.    Marland Kitchen aspirin EC 81 MG tablet Take 1 tablet (81 mg total) by mouth daily. 90 tablet 3  . calcium carbonate (TUMS - DOSED IN MG ELEMENTAL CALCIUM) 500 MG chewable tablet Chew by mouth as needed.     . cilostazol (PLETAL) 50 MG tablet Take 1 tablet (50 mg total) by mouth 2 (two) times daily. 180 tablet 3  . HYDROcodone-acetaminophen (NORCO/VICODIN) 5-325 MG tablet Take 1 tablet by mouth every 8 (eight) hours as needed for  moderate pain. 21 tablet 0  . methocarbamol (ROBAXIN) 500 MG tablet Take 1 tablet (500 mg total) by mouth every 8 (eight) hours as needed for muscle spasms. 30 tablet 0  . oxybutynin (DITROPAN-XL) 5 MG 24 hr tablet Take 5 mg by mouth at bedtime. Take 1/2 to 1 tablet daily    . Probiotic Product (PROBIOTIC-10 PO) Take by mouth.    . rosuvastatin (CRESTOR) 20 MG tablet Take 1 tablet (20 mg total) by mouth daily. 90 tablet 3  . tretinoin (RETIN-A) 0.05 % cream APPLY TO AFFECTED AREA EXTERNALLY AT BEDTIME 20 g 0  . valsartan-hydrochlorothiazide (DIOVAN-HCT) 160-12.5 MG tablet Take 1  tablet by mouth daily.    Marland Kitchen zolpidem (AMBIEN) 10 MG tablet Take 1 tablet by mouth at bedtime.     No current facility-administered medications for this visit.    Allergies:   Tramadol, Benazepril, Cephalexin, and Penicillins    Social History:  The patient  reports that she has quit smoking. She has never used smokeless tobacco.   Family History:  The patient's family history is remarkable for aortic aneurysm affecting her grandfather.  No cardiac history.   ROS:  Please see the history of present illness.   Otherwise, review of systems are positive for none.   All other systems are reviewed and negative.    PHYSICAL EXAM: VS:  BP (!) 150/70   Pulse 75   Ht 5\' 6"  (1.676 m)   Wt 157 lb 12.8 oz (71.6 kg)   SpO2 96%   BMI 25.47 kg/m  , BMI Body mass index is 25.47 kg/m. GEN: Well nourished, well developed, in no acute distress  HEENT: normal  Neck: no JVD, carotid bruits, or masses Cardiac: RRR; no murmurs, rubs, or gallops,no edema  Respiratory:  clear to auscultation bilaterally, normal work of breathing GI: soft, nontender, nondistended, + BS MS: no deformity or atrophy  Skin: warm and dry, no rash Neuro:  Strength and sensation are intact Psych: euthymic mood, full affect Vascular: Femoral pulses normal bilaterally.  Dorsalis pedis is +2 on the right and +1 on the left.  Posterior tibial is not palpable  EKG:  EKG is ordered today. EKG showed normal sinus rhythm with left bundle branch block and possible left atrial enlargement.   Recent Labs: No results found for requested labs within last 8760 hours.    Lipid Panel    Component Value Date/Time   CHOL 228 (H) 09/30/2019 0950   TRIG 105 09/30/2019 0950   HDL 95 09/30/2019 0950   CHOLHDL 2.4 09/30/2019 0950   LDLCALC 115 (H) 09/30/2019 0950      Wt Readings from Last 3 Encounters:  06/01/20 157 lb 12.8 oz (71.6 kg)  03/02/20 159 lb 9.6 oz (72.4 kg)  12/30/19 156 lb 3.2 oz (70.9 kg)       No flowsheet  data found.    ASSESSMENT AND PLAN:  1.  Peripheral arterial disease: Current pain on the lateral side of the right leg does not seem to be vascular in etiology.  Femoral pulse is normal and dorsalis pedis is very strong on the right side.  I do not see any difference in her physical exam from most recent evaluation.  Suspect that this is neuropathic pain related to her back.  She has no symptoms affecting the left lower extremity and thus I recommend continuing medical therapy for peripheral arterial disease.  She seems to be doing well with cilostazol.   2.  Essential hypertension:  Blood pressure is controlled on current medications.  3. Hyperlipidemia: Continue treatment with rosuvastatin with a target LDL of less than 70.  4  Left bundle branch block: Echocardiogram showed normal LV systolic function and wall motion.   Disposition:   FU with me in 6 months  Signed,  Kathlyn Sacramento, MD  06/01/2020 11:27 AM    [place]

## 2020-06-01 NOTE — Patient Instructions (Signed)

## 2020-06-03 ENCOUNTER — Other Ambulatory Visit: Payer: Self-pay

## 2020-06-03 ENCOUNTER — Ambulatory Visit: Payer: Self-pay

## 2020-06-03 ENCOUNTER — Ambulatory Visit: Payer: Medicare HMO | Admitting: Physical Medicine and Rehabilitation

## 2020-06-03 ENCOUNTER — Encounter: Payer: Self-pay | Admitting: Physical Medicine and Rehabilitation

## 2020-06-03 VITALS — BP 151/74 | HR 87

## 2020-06-03 DIAGNOSIS — M5416 Radiculopathy, lumbar region: Secondary | ICD-10-CM

## 2020-06-03 DIAGNOSIS — M7138 Other bursal cyst, other site: Secondary | ICD-10-CM | POA: Diagnosis not present

## 2020-06-03 MED ORDER — METHYLPREDNISOLONE ACETATE 80 MG/ML IJ SUSP: 80.0000 mg | Freq: Once | INTRAMUSCULAR | Status: DC

## 2020-06-03 NOTE — Progress Notes (Signed)
Pt states lower back. Pt state wake up and getting out bed makes the pain worse. Pt state pain meds and heating pads helps ease the pain. Pt has hx of inj on 05/25/20 pt state didn't help with the pain.  Numeric Pain Rating Scale and Functional Assessment Average Pain 6   In the last MONTH (on 0-10 scale) has pain interfered with the following?  1. General activity like being  able to carry out your everyday physical activities such as walking, climbing stairs, carrying groceries, or moving a chair?  Rating(8)   +Driver, -BT, -Dye Allergies.

## 2020-06-03 NOTE — Telephone Encounter (Signed)
Pt has been approve Auth# [id number].

## 2020-06-10 ENCOUNTER — Encounter: Payer: Self-pay | Admitting: Physical Medicine and Rehabilitation

## 2020-06-10 ENCOUNTER — Ambulatory Visit: Payer: Medicare HMO | Admitting: Physical Medicine and Rehabilitation

## 2020-06-10 ENCOUNTER — Other Ambulatory Visit: Payer: Self-pay

## 2020-06-10 ENCOUNTER — Ambulatory Visit: Payer: Self-pay

## 2020-06-10 VITALS — BP 146/69 | HR 84

## 2020-06-10 DIAGNOSIS — M5416 Radiculopathy, lumbar region: Secondary | ICD-10-CM

## 2020-06-10 MED ORDER — BETAMETHASONE SOD PHOS & ACET 6 (3-3) MG/ML IJ SUSP
12.0000 mg | Freq: Once | INTRAMUSCULAR | Status: AC
  Administered 2020-06-10: 12 mg

## 2020-06-10 NOTE — Progress Notes (Signed)
Pt state pain in her right postier leg. Pt state sitting and anything makes the pain worse. Pt state she take pain med in the morning to starte her day.   Numeric Pain Rating Scale and Functional Assessment Average Pain 7   In the last MONTH (on 0-10 scale) has pain interfered with the following?  1. General activity like being  able to carry out your everyday physical activities such as walking, climbing stairs, carrying groceries, or moving a chair?  Rating(9)   +Driver, -BT, -Dye Allergies.

## 2020-06-11 NOTE — Progress Notes (Signed)
Jodi Cline - 79 y.o. female MRN [id number]  Date of birth: 10/03/1940  Office Visit Note: Visit Date: 06/10/2020 PCP: Lavone Orn, MD Referred by: Lavone Orn, MD  Subjective: Chief Complaint  Patient presents with  . Right Leg - Pain   HPI:  Jodi Cline is a 79 y.o. female who comes in today at the request of Dr. Laurence Spates for planned Right L5-S1 Lumbar epidural steroid injection with fluoroscopic guidance.  The patient has failed conservative care including home exercise, medications, time and activity modification.  This injection will be diagnostic and hopefully therapeutic.  Please see requesting physician notes for further details and justification.  I way of her review patient with right radicular leg pain with facet joint cyst medially at L4-5 on the right consistent with radicular pain.  Facet joint injection with aspiration did not seem to help as much as we had hoped.  MRI reviewed with images and spine model.  MRI reviewed in the note below.  Referring physician: Anderson Malta, MD   ROS Otherwise per HPI.  Assessment & Plan: Visit Diagnoses:  1. Lumbar radiculopathy     Plan: No additional findings.   Meds & Orders:  Meds ordered this encounter  Medications  . betamethasone acetate-betamethasone sodium phosphate (CELESTONE) injection 12 mg    Orders Placed This Encounter  Procedures  . XR C-ARM NO REPORT  . Epidural Steroid injection    Follow-up: Return if symptoms worsen or fail to improve.   Procedures: No procedures performed  Lumbar Epidural Steroid Injection - Interlaminar Approach with Fluoroscopic Guidance  Patient: Jodi Cline      Date of Birth: Feb 17, 1941 MRN: [id number] PCP: Lavone Orn, MD      Visit Date: 06/10/2020   Universal Protocol:     Consent Given By: the patient  Position: PRONE  Additional Comments: Vital signs were monitored before and after the procedure. Patient was prepped and draped in the usual sterile  fashion. The correct patient, procedure, and site was verified.   Injection Procedure Details:   Procedure diagnoses:  1. Lumbar radiculopathy      Meds Administered:  Meds ordered this encounter  Medications  . betamethasone acetate-betamethasone sodium phosphate (CELESTONE) injection 12 mg     Laterality: Right  Location/Site:  L5-S1  Needle size: 20 G  Needle type: Tuohy  Needle Placement: Paramedian epidural  Findings:   -Comments: Excellent flow of contrast into the epidural space.  Procedure Details: Using a paramedian approach from the side mentioned above, the region overlying the inferior lamina was localized under fluoroscopic visualization and the soft tissues overlying this structure were infiltrated with 4 ml. of 1% Lidocaine without Epinephrine. The Tuohy needle was inserted into the epidural space using a paramedian approach.   The epidural space was localized using loss of resistance along with counter oblique bi-planar fluoroscopic views.  After negative aspirate for air, blood, and CSF, a 2 ml. volume of Isovue-250 was injected into the epidural space and the flow of contrast was observed. Radiographs were obtained for documentation purposes.    The injectate was administered into the level noted above.   Additional Comments:  The patient tolerated the procedure well Dressing: 2 x 2 sterile gauze and Band-Aid    Post-procedure details: Patient was observed during the procedure. Post-procedure instructions were reviewed.  Patient left the clinic in stable condition.     Clinical History: MRI LUMBAR SPINE WITHOUT CONTRAST  TECHNIQUE: Multiplanar, multisequence MR imaging of  the lumbar spine was performed. No intravenous contrast was administered.  COMPARISON:  Radiography 04/29/2020  FINDINGS: Segmentation:  5 lumbar type vertebral bodies.  Alignment:  Normal  Vertebrae:  Normal  Conus medullaris and cauda equina: Conus extends  to the T12-L1 level. Conus and cauda equina appear normal.  Paraspinal and other soft tissues: Normal  Disc levels:  Minimal non-compressive disc bulges at T11-12 and T12-L1.  L1-2, L2-3 and L3-4 do not show any disc pathology. Minimal facet hypertrophy without stenosis.  L4-5: Bilateral facet osteoarthritis with facet and ligamentous hypertrophy. Synovial cysts, including a 7 mm synovial cyst projecting inward from the facet on the right. Bulging of the disc. Stenosis of the lateral recesses, right more than left, because of the synovial cyst. Right L5 nerve compression quite possible in this location. The facet arthropathy shows edematous change, which could be correlated with low back pain or referred facet syndrome pain.  L5-S1: No disc abnormality. Minimal facet osteoarthritis. No stenosis.  IMPRESSION: At L4-5, there is bilateral facet arthropathy with facet and ligamentous hypertrophy. Synovial cyst measuring 7 mm projecting inward from the facet on the right. Stenosis of the lateral recesses, right more than left, because of the synovial cyst. Right L5 nerve compression quite possible in this location. The facet arthropathy shows edematous change, which could be correlated with low back pain or referred facet syndrome pain.   Electronically Signed   By: Nelson Chimes M.D.   On: 05/12/2020 10:30     Objective:  VS:  HT:    WT:   BMI:     BP:(!) 146/69  HR:84bpm  TEMP: ( )  RESP:  Physical Exam Constitutional:      General: She is not in acute distress.    Appearance: Normal appearance. She is not ill-appearing.  HENT:     Head: Normocephalic and atraumatic.     Right Ear: External ear normal.     Left Ear: External ear normal.  Eyes:     Extraocular Movements: Extraocular movements intact.  Cardiovascular:     Rate and Rhythm: Normal rate.     Pulses: Normal pulses.  Musculoskeletal:     Right lower leg: No edema.     Left lower leg: No  edema.     Comments: Patient has good distal strength with no pain over the greater trochanters.  No clonus or focal weakness.  Skin:    Findings: No erythema, lesion or rash.  Neurological:     General: No focal deficit present.     Mental Status: She is alert and oriented to person, place, and time.     Sensory: No sensory deficit.     Motor: No weakness or abnormal muscle tone.     Coordination: Coordination normal.  Psychiatric:        Mood and Affect: Mood normal.        Behavior: Behavior normal.      Imaging: XR C-ARM NO REPORT  Result Date: 06/10/2020 Please see Notes tab for imaging impression.

## 2020-06-11 NOTE — Procedures (Signed)
Lumbar Epidural Steroid Injection - Interlaminar Approach with Fluoroscopic Guidance  Patient: Jodi Cline      Date of Birth: 12-11-1940 MRN: [id number] PCP: Lavone Orn, MD      Visit Date: 06/10/2020   Universal Protocol:     Consent Given By: the patient  Position: PRONE  Additional Comments: Vital signs were monitored before and after the procedure. Patient was prepped and draped in the usual sterile fashion. The correct patient, procedure, and site was verified.   Injection Procedure Details:   Procedure diagnoses:  1. Lumbar radiculopathy      Meds Administered:  Meds ordered this encounter  Medications  . betamethasone acetate-betamethasone sodium phosphate (CELESTONE) injection 12 mg     Laterality: Right  Location/Site:  L5-S1  Needle size: 20 G  Needle type: Tuohy  Needle Placement: Paramedian epidural  Findings:   -Comments: Excellent flow of contrast into the epidural space.  Procedure Details: Using a paramedian approach from the side mentioned above, the region overlying the inferior lamina was localized under fluoroscopic visualization and the soft tissues overlying this structure were infiltrated with 4 ml. of 1% Lidocaine without Epinephrine. The Tuohy needle was inserted into the epidural space using a paramedian approach.   The epidural space was localized using loss of resistance along with counter oblique bi-planar fluoroscopic views.  After negative aspirate for air, blood, and CSF, a 2 ml. volume of Isovue-250 was injected into the epidural space and the flow of contrast was observed. Radiographs were obtained for documentation purposes.    The injectate was administered into the level noted above.   Additional Comments:  The patient tolerated the procedure well Dressing: 2 x 2 sterile gauze and Band-Aid    Post-procedure details: Patient was observed during the procedure. Post-procedure instructions were reviewed.  Patient left  the clinic in stable condition.

## 2020-06-17 ENCOUNTER — Ambulatory Visit: Payer: Medicare HMO | Admitting: Podiatry

## 2020-06-22 ENCOUNTER — Telehealth: Payer: Self-pay | Admitting: Physical Medicine and Rehabilitation

## 2020-06-22 NOTE — Telephone Encounter (Signed)
Awesome so esi was better than facet aspiration

## 2020-06-22 NOTE — Telephone Encounter (Signed)
FYI

## 2020-06-22 NOTE — Telephone Encounter (Signed)
Pt called wanting to make Dr.Newton aware she's doing really well and she wanted to thank him.

## 2020-06-28 ENCOUNTER — Ambulatory Visit: Payer: Medicare HMO | Admitting: Physician Assistant

## 2020-06-29 ENCOUNTER — Ambulatory Visit: Payer: Medicare HMO | Admitting: Cardiovascular Disease

## 2020-06-30 DIAGNOSIS — I739 Peripheral vascular disease, unspecified: Secondary | ICD-10-CM | POA: Diagnosis not present

## 2020-06-30 DIAGNOSIS — E7800 Pure hypercholesterolemia, unspecified: Secondary | ICD-10-CM | POA: Diagnosis not present

## 2020-06-30 DIAGNOSIS — Z1389 Encounter for screening for other disorder: Secondary | ICD-10-CM | POA: Diagnosis not present

## 2020-06-30 DIAGNOSIS — N3281 Overactive bladder: Secondary | ICD-10-CM | POA: Diagnosis not present

## 2020-06-30 DIAGNOSIS — I10 Essential (primary) hypertension: Secondary | ICD-10-CM | POA: Diagnosis not present

## 2020-06-30 DIAGNOSIS — Z0000 Encounter for general adult medical examination without abnormal findings: Secondary | ICD-10-CM | POA: Diagnosis not present

## 2020-06-30 DIAGNOSIS — Z23 Encounter for immunization: Secondary | ICD-10-CM | POA: Diagnosis not present

## 2020-07-01 DIAGNOSIS — E7800 Pure hypercholesterolemia, unspecified: Secondary | ICD-10-CM | POA: Diagnosis not present

## 2020-07-01 DIAGNOSIS — I10 Essential (primary) hypertension: Secondary | ICD-10-CM | POA: Diagnosis not present

## 2020-07-04 ENCOUNTER — Encounter: Payer: Self-pay | Admitting: Physical Medicine and Rehabilitation

## 2020-07-04 NOTE — Progress Notes (Signed)
Jodi Cline - 79 y.o. female MRN [id number]  Date of birth: 13-Jan-1941  Office Visit Note: Visit Date: 06/03/2020 PCP: Lavone Orn, MD Referred by: Lavone Orn, MD  Subjective: Chief Complaint  Patient presents with  . Lower Back - Pain   HPI: Jodi Cline is a 79 y.o. female who comes in today For evaluation management of chronic worsening low back and right hip and leg pain.  She rates her pain as a 6 out of 10 but it 10 in terms of activities of daily living and just really not being able do what she like to do.  This is been despite medication management and time.  MRI has been performed and reviewed below.  We completed the facet joint injection and aspiration which was successful in terms of the injection but not successful in terms of pain relief.  It really did not help her much at all.  She gets worsening back and hip pain getting in and out of bed and pain in the morning.  Movement seems to help to a degree but it can catch and be severe.  She is followed from an orthopedic standpoint by G. Alphonzo Severance, M.D. and [facility] PA-C.  She has had no new symptoms since I saw her last.  Continues to get more of a radicular pain in the hip and leg.  Nothing on the left.  Review of Systems  Musculoskeletal: Positive for back pain.       Right hip and leg pain  All other systems reviewed and are negative.  Otherwise per HPI.  Assessment & Plan: Visit Diagnoses:  1. Lumbar radiculopathy   2. Synovial cyst of lumbar facet joint     Plan: Findings:  Worsening severe right hip and leg pain consistent with radiculitis radiculopathy.  This is likely to facet joint cyst medially likely impacting the L5 nerve root in the lateral recess.  She has facet arthritis on both left and right but more hip and leg pain than back pain.  Facet joint aspiration and injection was really not very beneficial.  We will set her up for interlaminar dural steroid injection diagnostically and hopefully  therapeutically.  This be done with fluoroscopic guidance as part of a full pain management program.  Depending on relief with that could consider transforaminal approach versus spine surgery referral.  She will continue to follow with Dr. Marlou Sa for her orthopedic care.    Meds & Orders:  Meds ordered this encounter  Medications  . DISCONTD: methylPREDNISolone acetate (DEPO-MEDROL) injection 80 mg   No orders of the defined types were placed in this encounter.   Follow-up: Return for Right L5-S1 interlaminar steroid injection.   Procedures: No procedures performed  No notes on file   Clinical History: MRI LUMBAR SPINE WITHOUT CONTRAST  TECHNIQUE: Multiplanar, multisequence MR imaging of the lumbar spine was performed. No intravenous contrast was administered.  COMPARISON:  Radiography 04/29/2020  FINDINGS: Segmentation:  5 lumbar type vertebral bodies.  Alignment:  Normal  Vertebrae:  Normal  Conus medullaris and cauda equina: Conus extends to the T12-L1 level. Conus and cauda equina appear normal.  Paraspinal and other soft tissues: Normal  Disc levels:  Minimal non-compressive disc bulges at T11-12 and T12-L1.  L1-2, L2-3 and L3-4 do not show any disc pathology. Minimal facet hypertrophy without stenosis.  L4-5: Bilateral facet osteoarthritis with facet and ligamentous hypertrophy. Synovial cysts, including a 7 mm synovial cyst projecting inward from the facet on the  right. Bulging of the disc. Stenosis of the lateral recesses, right more than left, because of the synovial cyst. Right L5 nerve compression quite possible in this location. The facet arthropathy shows edematous change, which could be correlated with low back pain or referred facet syndrome pain.  L5-S1: No disc abnormality. Minimal facet osteoarthritis. No stenosis.  IMPRESSION: At L4-5, there is bilateral facet arthropathy with facet and ligamentous hypertrophy. Synovial cyst  measuring 7 mm projecting inward from the facet on the right. Stenosis of the lateral recesses, right more than left, because of the synovial cyst. Right L5 nerve compression quite possible in this location. The facet arthropathy shows edematous change, which could be correlated with low back pain or referred facet syndrome pain.   Electronically Signed   By: Nelson Chimes M.D.   On: 05/12/2020 10:30   She reports that she has quit smoking. She has never used smokeless tobacco. No results for input(s): HGBA1C, LABURIC in the last 8760 hours.  Objective:  VS:  HT:    WT:   BMI:     BP:(!) 151/74  HR:87bpm  TEMP: ( )  RESP:  Physical Exam Constitutional:      General: She is not in acute distress.    Appearance: Normal appearance. She is not ill-appearing.  HENT:     Head: Normocephalic and atraumatic.     Right Ear: External ear normal.     Left Ear: External ear normal.  Eyes:     Extraocular Movements: Extraocular movements intact.  Cardiovascular:     Rate and Rhythm: Normal rate.     Pulses: Normal pulses.  Musculoskeletal:     Right lower leg: No edema.     Left lower leg: No edema.     Comments: Patient has good distal strength with no pain over the greater trochanters.  No clonus or focal weakness.  Skin:    Findings: No erythema, lesion or rash.  Neurological:     General: No focal deficit present.     Mental Status: She is alert and oriented to person, place, and time.     Sensory: No sensory deficit.     Motor: No weakness or abnormal muscle tone.     Coordination: Coordination normal.  Psychiatric:        Mood and Affect: Mood normal.        Behavior: Behavior normal.     Ortho Exam  Imaging: No results found.  Past Medical/Family/Surgical/Social History: Medications & Allergies reviewed per EMR, new medications updated. Patient Active Problem List   Diagnosis Date Noted  . Synovial cyst of lumbar facet joint 06/03/2020  . Greater trochanteric  pain syndrome of left lower extremity 10/31/2017  . Retrocalcaneal bursitis (back of heel), left 10/31/2017  . Benign essential HTN 09/27/2017  . Benign neoplasm of colon 09/27/2017  . Cannot sleep 09/27/2017  . Constipation by outlet dysfunction 09/27/2017  . Hematuria syndrome 09/27/2017  . HLD (hyperlipidemia) 09/27/2017  . Radiculitis 09/27/2017   Past Medical History:  Diagnosis Date  . BCC (basal cell carcinoma) 04/18/2005   Right mid thigh (CX35FU)  . BCC (basal cell carcinoma) 04/07/2009   right shoulder blade (Cx3)  . Hyperlipidemia   . Hypertension   . Insomnia   . Nodular basal cell carcinoma (BCC) 08/05/2013   right shoulder blade (EXC)  . SCC (squamous cell carcinoma) 02/19/2001   left arm (CX35FU)  . SCC (squamous cell carcinoma) 04/23/2013   left shin (txpbx)  . SCC (squamous  cell carcinoma) 08/05/2013   left shin (txpbx)  . SCC (squamous cell carcinoma) 05/07/2014   Left shin (txpbx)  . SCC (squamous cell carcinoma) 08/04/2014   left shin (MOHS)  . Superficial basal cell carcinoma (BCC) 07/13/2015   right thigh (tx p bx)   History reviewed. No pertinent family history. Past Surgical History:  Procedure Laterality Date  . CESAREAN SECTION    . THORACIC SPINE SURGERY     Dr. Tonita Cong  . TUBAL LIGATION     Social History   Occupational History  . Not on file  Tobacco Use  . Smoking status: Former Research scientist (life sciences)  . Smokeless tobacco: Never Used  Vaping Use  . Vaping Use: Never used  Substance and Sexual Activity  . Alcohol use: Not on file  . Drug use: Not on file  . Sexual activity: Not on file

## 2020-07-12 ENCOUNTER — Telehealth: Payer: Self-pay | Admitting: Physical Medicine and Rehabilitation

## 2020-07-12 DIAGNOSIS — M5416 Radiculopathy, lumbar region: Secondary | ICD-10-CM

## 2020-07-12 DIAGNOSIS — M5116 Intervertebral disc disorders with radiculopathy, lumbar region: Secondary | ICD-10-CM

## 2020-07-12 NOTE — Telephone Encounter (Signed)
Repeat injection vs spine surgery referral

## 2020-07-12 NOTE — Telephone Encounter (Signed)
Pt called stating the injection has stopped working and her pain has come back; pt would like a CB to discuss her options for treating this pain now  [phone]

## 2020-07-12 NOTE — Telephone Encounter (Signed)
I called the patient to advise, and she would like to speak with a spine surgeon. Please advise.

## 2020-07-12 NOTE — Telephone Encounter (Signed)
Patient's last injection was a right L5-S1 IL on 10/14. Please advise.

## 2020-07-13 NOTE — Telephone Encounter (Signed)
If we don't her from [facility] then refer to [facility]

## 2020-07-15 NOTE — Telephone Encounter (Signed)
Referred to  Dr. Louanne Skye.

## 2020-07-15 NOTE — Telephone Encounter (Signed)
Called patient to advise that referral has been placed.

## 2020-07-29 ENCOUNTER — Telehealth: Payer: Self-pay | Admitting: Physical Medicine and Rehabilitation

## 2020-07-29 DIAGNOSIS — M5416 Radiculopathy, lumbar region: Secondary | ICD-10-CM

## 2020-07-29 NOTE — Telephone Encounter (Signed)
Pt requesting a inj for her pain. Pt had a Right L5-S1 IL on 10/14 and Facets on 9/28 this year. You have put in a referral for her to see Dr. Louanne Skye on 12/16 for poss. Surgery. Pt didn't want to wait till appt for [facility]. Pt says she felt a pop while she was in bed with the same pain, now in both legs. She has meds and no report of new injury.

## 2020-07-29 NOTE — Telephone Encounter (Signed)
L5-S1 interlam with me or [place] [facility] ( whomever faster, she can shoose) Keep Appointment with [facility]

## 2020-07-29 NOTE — Telephone Encounter (Signed)
Patient called to set an appt. Please call patient at [phone].

## 2020-07-30 NOTE — Telephone Encounter (Signed)
Place for [facility] [place] pt wants as soon as poss.

## 2020-08-06 ENCOUNTER — Telehealth: Payer: Self-pay | Admitting: Cardiovascular Disease

## 2020-08-06 ENCOUNTER — Other Ambulatory Visit: Payer: Medicare HMO

## 2020-08-06 ENCOUNTER — Other Ambulatory Visit: Payer: Self-pay | Admitting: Cardiovascular Disease

## 2020-08-06 NOTE — Telephone Encounter (Signed)
Hi Dr. Fletcher Anon,   Patient has upcoming spinal injection planned and is being asked to hold Pletal. She has a history of PAD. Doppler studies done in January 2021 showed normal ABI bilaterally.  However, duplex showed one-vessel runoff below the knee on the right side via the peroneal artery.  On the left, the proximal popliteal artery was ectatic measuring 1 cm with one-vessel runoff below the knee via the anterior tibial artery which was stenosed significantly in the distal segment. Can you please comment on how long patient can hold Pletal for prior to spinal injection?  Please route response back to P CV DIV PREOP.  Thank you! Chaunte Hornbeck

## 2020-08-06 NOTE — Telephone Encounter (Signed)
   [facility] Pre-operative Risk Assessment    HEARTCARE STAFF: - Please ensure there is not already an duplicate clearance open for this procedure. - Under Visit Info/Reason for Call, type in Other and utilize the format Clearance MM/DD/YY or Clearance TBD. Do not use dashes or single digits. - If request is for dental extraction, please clarify the # of teeth to be extracted.  Request for surgical clearance:  1. What type of surgery is being performed? Lumbar epidural  2. When is this surgery scheduled? TBD  3. What type of clearance is required (medical clearance vs. Pharmacy clearance to hold med vs. Both)? pharm  4. Are there any medications that need to be held prior to surgery and how long? Pletal for 4 days  5. Practice name and name of physician performing surgery? [facility]   6. What is the office phone number? [phone]   7.   What is the office fax number? [phone]  8.   Anesthesia type (None, local, MAC, general) ? local   Marykay Lex 08/06/2020, 4:13 PM  _________________________________________________________________   (provider comments below)

## 2020-08-09 ENCOUNTER — Inpatient Hospital Stay: Admission: RE | Admit: 2020-08-09 | Payer: Medicare HMO | Source: Ambulatory Visit

## 2020-08-09 ENCOUNTER — Telehealth: Payer: Self-pay | Admitting: Physical Medicine and Rehabilitation

## 2020-08-09 NOTE — Telephone Encounter (Signed)
Pt wants to know what kind of injections she has received in previous visits. Also wants to know what is in the injections. Please call her at [phone]

## 2020-08-10 ENCOUNTER — Telehealth: Payer: Self-pay

## 2020-08-10 ENCOUNTER — Other Ambulatory Visit: Payer: Self-pay | Admitting: Cardiovascular Disease

## 2020-08-10 NOTE — Telephone Encounter (Signed)
Patient called to check the status of the surgical clearance.

## 2020-08-10 NOTE — Telephone Encounter (Signed)
See previous message

## 2020-08-10 NOTE — Telephone Encounter (Signed)
Left message #1

## 2020-08-10 NOTE — Telephone Encounter (Signed)
Patient called she is returning your phone call CB:[phone]

## 2020-08-11 NOTE — Telephone Encounter (Signed)
Patient calling to check on the status of this surgical clearance. She states both her and the requesting doctor's office have tried calling and have been waiting for an answer since Friday. She states she would like to get her injection as soon as possible. I advised that we would call to update as soon we are able too. Patient verbalized her frustration with the time frame to receive a response but understood why she was waiting and would continue to do so.

## 2020-08-11 NOTE — Telephone Encounter (Signed)
Called patient to advise  °

## 2020-08-11 NOTE — Telephone Encounter (Signed)
Hold Pletal 5 days before and resume 1 day after.

## 2020-08-11 NOTE — Telephone Encounter (Signed)
   Primary Cardiologist: Elouise Munroe, MD  Chart reviewed as part of pre-operative protocol coverage.  Per Dr. Fletcher Anon - may hold pletal for 4 days and resume 1 days after procedure.   I will route this recommendation to the requesting party via Epic fax function and remove from pre-op pool. Please call with questions.  Ledora Bottcher, PA 08/11/2020, 4:38 PM

## 2020-08-11 NOTE — Telephone Encounter (Signed)
I have left a message for [facility] we have faxed over clearance with recommendations on holding Pletal. I have also left a message (DPR ok to leave message) with detailed instructions in regards to holding Pletal x 4 days prior to injection and resume 1 day after injection per Dr. Fletcher Anon. These notes will all be faxed to [facility] fax# [phone]. If any questions please call the office or may even call [facility] as they will have the information as well. Will remove from the pre op call back pool.

## 2020-08-12 ENCOUNTER — Ambulatory Visit: Payer: Medicare HMO | Admitting: Specialist

## 2020-08-12 ENCOUNTER — Encounter: Payer: Self-pay | Admitting: Specialist

## 2020-08-12 ENCOUNTER — Ambulatory Visit: Payer: Self-pay

## 2020-08-12 ENCOUNTER — Other Ambulatory Visit: Payer: Self-pay

## 2020-08-12 VITALS — BP 167/77 | HR 75 | Ht 66.0 in | Wt 160.0 lb

## 2020-08-12 DIAGNOSIS — M4316 Spondylolisthesis, lumbar region: Secondary | ICD-10-CM

## 2020-08-12 DIAGNOSIS — M7138 Other bursal cyst, other site: Secondary | ICD-10-CM

## 2020-08-12 DIAGNOSIS — M5116 Intervertebral disc disorders with radiculopathy, lumbar region: Secondary | ICD-10-CM | POA: Diagnosis not present

## 2020-08-12 DIAGNOSIS — M5416 Radiculopathy, lumbar region: Secondary | ICD-10-CM

## 2020-08-12 MED ORDER — PREGABALIN 50 MG PO CAPS: ORAL_CAPSULE | ORAL | 0 refills | Status: DC

## 2020-08-12 NOTE — Patient Instructions (Signed)
Avoid bending, stooping and avoid lifting weights greater than 10 lbs. Avoid prolong standing and walking. Avoid frequent bending and stooping  No lifting greater than 10 lbs. May use ice or moist heat for pain. Weight loss is of benefit. Handicap license is approved. Lyrica 50 mg up to TID

## 2020-08-12 NOTE — Addendum Note (Signed)
Addended by: Basil Dess on: 08/12/2020 12:35 PM   Modules accepted: Orders

## 2020-08-12 NOTE — Progress Notes (Addendum)
Office Visit Note   Patient: Jodi Cline           Date of Birth: [birth year]           MRN: [id number] Visit Date: 08/12/2020              Requested by: Magnus Sinning, MD [address] [place],  [place] [address] PCP: Lavone Orn, MD   Assessment & Plan: Visit Diagnoses:  1. Radiculopathy, lumbar region   2. Intervertebral disc disorders with radiculopathy, lumbar region   3. Spondylolisthesis, lumbar region   4. Synovial cyst of lumbar facet joint     Plan: Avoid bending, stooping and avoid lifting weights greater than 10 lbs. Avoid prolong standing and walking. Avoid frequent bending and stooping  No lifting greater than 10 lbs. May use ice or moist heat for pain. Weight loss is of benefit. Handicap license is approved. Lyrica 50 mg up to TID  Follow-Up Instructions: Return in about 6 weeks (around 09/23/2020).   Orders:  Orders Placed This Encounter  Procedures  . XR Lumb Spine Flex&Ext Only   Meds ordered this encounter  Medications  . pregabalin (LYRICA) 50 MG capsule    Sig: Take 1 capsule (50 mg total) by mouth at bedtime for 7 days, THEN 1 capsule (50 mg total) 2 (two) times daily for 23 days.    Dispense:  53 capsule    Refill:  0      Procedures: No procedures performed   Clinical Data: No additional findings.   Subjective: Chief Complaint  Patient presents with  . Lower Back - Pain    79 year old female with history of right leg pain and numbness and it shoots from the right lateral knee to the right lateral foot. History of previous acromion nondisplaced fracture earlier this year and then had evaluation of the right leg pain by Dr. Marlou Sa. She has seen her pulmonary [place], had approval for injection of the back to improve the pain. The pain is mainly right leg greater than back. Moving a certain way will cause pain into the right hip. It is more painful first thing in the AM but improves with standing and walking also the pain is less  with sitting. She has had  2 injections, one a facet injection and the 2nd was an ESI with pain relief for about 4 weeks. The pain on a scale of 1-10 is about an 8-9 but mostly it is about a 6. She takes tylenol#3 intermittantly for pain. She is presently on a blood Thinner Cilostazol (Plentel). Pain is worsened with bending, stooping and stiffness when going to standing.  Bowel and bladder is functioning normally. There is sometimes a feeling like the right leg wants to give away. Stairs are okay. She is not able to walk one mile  Always and had walked in the past for exercise. 3 years ago she fell over a plantar and had bilateral knee abrasions and lacerations and had To go to wound care clinic for weeks. She is able to grocery shop doesn't always use a cart and is able to walk at grocery store.   Review of Systems  Constitutional: Negative.  Negative for activity change, appetite change, chills, diaphoresis, fatigue, fever and unexpected weight change.  HENT: Positive for sore throat and trouble swallowing (dry mouth). Negative for congestion, dental problem, drooling, ear discharge, ear pain, facial swelling, hearing loss, mouth sores, nosebleeds, postnasal drip, rhinorrhea, sinus pressure, sinus pain, sneezing, tinnitus and  voice change.   Eyes: Positive for itching. Negative for photophobia, pain, discharge, redness and visual disturbance (stye right eye).  Respiratory: Negative.  Negative for apnea, cough, choking, chest tightness, shortness of breath, wheezing and stridor.   Cardiovascular: Negative.  Negative for chest pain, palpitations and leg swelling.  Gastrointestinal: Negative.  Negative for abdominal distention, abdominal pain, anal bleeding, blood in stool, constipation, diarrhea, nausea, rectal pain and vomiting.  Endocrine: Positive for polydipsia.  Genitourinary: Negative.  Negative for difficulty urinating, dyspareunia, dysuria, enuresis, flank pain, frequency, genital sores and  hematuria.  Musculoskeletal: Positive for gait problem. Negative for arthralgias, back pain and joint swelling.  Skin: Positive for wound. Negative for color change, pallor and rash.  Allergic/Immunologic: Negative for environmental allergies, food allergies and immunocompromised state.  Neurological: Positive for weakness, light-headedness and numbness. Negative for dizziness, tremors, seizures, facial asymmetry, speech difficulty and headaches.  Hematological: Negative for adenopathy. Does not bruise/bleed easily.  Psychiatric/Behavioral: Negative for agitation, behavioral problems, confusion, decreased concentration, dysphoric mood, hallucinations, self-injury, sleep disturbance and suicidal ideas. The patient is not nervous/anxious and is not hyperactive.      Objective: Vital Signs: BP (!) 167/77 (BP Location: Left Arm, Patient Position: Sitting)   Pulse 75   Ht 5\' 6"  (1.676 m)   Wt 160 lb (72.6 kg)   BMI 25.82 kg/m   Physical Exam Constitutional:      Appearance: She is well-developed and well-nourished.  HENT:     Head: Normocephalic and atraumatic.  Eyes:     Extraocular Movements: EOM normal.     Pupils: Pupils are equal, round, and reactive to light.  Pulmonary:     Effort: Pulmonary effort is normal.     Breath sounds: Normal breath sounds.  Abdominal:     General: Bowel sounds are normal.     Palpations: Abdomen is soft.  Musculoskeletal:        General: Normal range of motion.     Cervical back: Normal range of motion and neck supple.     Lumbar back: Negative right straight leg raise test and negative left straight leg raise test.  Skin:    General: Skin is warm and dry.  Neurological:     Mental Status: She is alert and oriented to person, place, and time.  Psychiatric:        Mood and Affect: Mood and affect normal.        Behavior: Behavior normal.        Thought Content: Thought content normal.        Judgment: Judgment normal.     Back Exam    Tenderness  The patient is experiencing tenderness in the lumbar.  Range of Motion  Extension: normal  Flexion: normal  Lateral bend right: normal  Lateral bend left: normal  Rotation right: normal  Rotation left: normal   Muscle Strength  Right Quadriceps:  5/5  Left Quadriceps:  5/5  Right Hamstrings:  5/5  Left Hamstrings:  5/5   Tests  Straight leg raise right: negative Straight leg raise left: negative  Reflexes  Patellar: 2/4  Other  Toe walk: normal Heel walk: normal Sensation: normal Gait: normal  Erythema: no back redness Scars: absent  Comments:  Right EHL 4/5      Specialty Comments:  No specialty comments available.  Imaging: No results found.   PMFS History: Patient Active Problem List   Diagnosis Date Noted  . Synovial cyst of lumbar facet joint 06/03/2020  . Greater trochanteric  pain syndrome of left lower extremity 10/31/2017  . Retrocalcaneal bursitis (back of heel), left 10/31/2017  . Benign essential HTN 09/27/2017  . Benign neoplasm of colon 09/27/2017  . Cannot sleep 09/27/2017  . Constipation by outlet dysfunction 09/27/2017  . Hematuria syndrome 09/27/2017  . HLD (hyperlipidemia) 09/27/2017  . Radiculitis 09/27/2017   Past Medical History:  Diagnosis Date  . BCC (basal cell carcinoma) 04/18/2005   Right mid thigh (CX35FU)  . BCC (basal cell carcinoma) 04/07/2009   right shoulder blade (Cx3)  . Hyperlipidemia   . Hypertension   . Insomnia   . Nodular basal cell carcinoma (BCC) 08/05/2013   right shoulder blade (EXC)  . SCC (squamous cell carcinoma) 02/19/2001   left arm (CX35FU)  . SCC (squamous cell carcinoma) 04/23/2013   left shin (txpbx)  . SCC (squamous cell carcinoma) 08/05/2013   left shin (txpbx)  . SCC (squamous cell carcinoma) 05/07/2014   Left shin (txpbx)  . SCC (squamous cell carcinoma) 08/04/2014   left shin (MOHS)  . Superficial basal cell carcinoma (BCC) 07/13/2015   right thigh (tx p bx)     History reviewed. No pertinent family history.  Past Surgical History:  Procedure Laterality Date  . CESAREAN SECTION    . THORACIC SPINE SURGERY     Dr. Tonita Cong  . TUBAL LIGATION     Social History   Occupational History  . Not on file  Tobacco Use  . Smoking status: Former Research scientist (life sciences)  . Smokeless tobacco: Never Used  Vaping Use  . Vaping Use: Never used  Substance and Sexual Activity  . Alcohol use: Not on file  . Drug use: Not on file  . Sexual activity: Not on file

## 2020-08-17 ENCOUNTER — Telehealth: Payer: Self-pay

## 2020-08-17 NOTE — Telephone Encounter (Signed)
Patient called and requested to speak with me - was in chart to see last telephone note I entered on behalf of patient

## 2020-08-18 ENCOUNTER — Other Ambulatory Visit: Payer: Self-pay | Admitting: Surgical

## 2020-08-18 ENCOUNTER — Ambulatory Visit
Admission: RE | Admit: 2020-08-18 | Discharge: 2020-08-18 | Disposition: A | Discharge status: Home / Self Care | Payer: Medicare HMO | Source: Ambulatory Visit | Attending: Physical Medicine and Rehabilitation | Admitting: Physical Medicine and Rehabilitation

## 2020-08-18 ENCOUNTER — Telehealth: Payer: Self-pay | Admitting: Specialist

## 2020-08-18 DIAGNOSIS — M5416 Radiculopathy, lumbar region: Secondary | ICD-10-CM | POA: Diagnosis not present

## 2020-08-18 MED ORDER — IOPAMIDOL (ISOVUE-M 200) INJECTION 41%
1.0000 mL | Freq: Once | INTRAMUSCULAR | Status: AC
  Administered 2020-08-18: 1 mL via EPIDURAL

## 2020-08-18 MED ORDER — METHYLPREDNISOLONE ACETATE 40 MG/ML INJ SUSP (RADIOLOG
120.0000 mg | Freq: Once | INTRAMUSCULAR | Status: AC
  Administered 2020-08-18: 120 mg via EPIDURAL

## 2020-08-18 NOTE — Discharge Instructions (Signed)

## 2020-08-18 NOTE — Telephone Encounter (Signed)
Pt called stating she was approved for a handicap placard and would like to pick it up

## 2020-08-18 NOTE — Telephone Encounter (Signed)
Form is ready and patient is aware

## 2020-08-30 DIAGNOSIS — M47816 Spondylosis without myelopathy or radiculopathy, lumbar region: Secondary | ICD-10-CM | POA: Diagnosis not present

## 2020-08-31 ENCOUNTER — Other Ambulatory Visit: Payer: Self-pay

## 2020-08-31 ENCOUNTER — Other Ambulatory Visit: Payer: Self-pay | Admitting: Internal Medicine

## 2020-08-31 ENCOUNTER — Ambulatory Visit (INDEPENDENT_AMBULATORY_CARE_PROVIDER_SITE_OTHER): Payer: Medicare HMO | Admitting: Internal Medicine

## 2020-08-31 ENCOUNTER — Encounter: Payer: Self-pay | Admitting: Internal Medicine

## 2020-08-31 VITALS — BP 130/70 | HR 72 | Ht 66.5 in | Wt 152.6 lb

## 2020-08-31 DIAGNOSIS — I10 Essential (primary) hypertension: Secondary | ICD-10-CM

## 2020-08-31 DIAGNOSIS — E785 Hyperlipidemia, unspecified: Secondary | ICD-10-CM

## 2020-08-31 DIAGNOSIS — I447 Left bundle-branch block, unspecified: Secondary | ICD-10-CM | POA: Diagnosis not present

## 2020-08-31 DIAGNOSIS — I739 Peripheral vascular disease, unspecified: Secondary | ICD-10-CM

## 2020-08-31 MED ORDER — AMLODIPINE BESYLATE 5 MG PO TABS: 10.0000 mg | ORAL_TABLET | Freq: Every day | ORAL | 3 refills | Status: DC

## 2020-08-31 NOTE — Progress Notes (Signed)
Cardiology Office Note:    Date:  08/31/2020   ID:  [name], DOB February 10, 1941, MRN [id number]  PCP:  Lavone Orn, MD  Cardiologist:  Elouise Munroe, MD  Electrophysiologist:  None   Referring MD: Lavone Orn, MD   Chief Complaint/Reason for Referral: PAD, HLD, LBBB  History of Present Illness:    Jodi Cline is a 80 y.o. female with a history of hyperlipidemia, hypertension, squamous cell carcinomas and basal cell carcinomas of the skin, who presents today for follow up of PAD, and hyperlipidemia.    Doing well overall. Chronic leg discomfort with walking though she notes this improved after starting cilostazol and may also be related to plantar fasciitis We discussed this medication in detail today. Continues on crestor 20 mg daily, no issues.   The patient denies chest pain, chest pressure, dyspnea at rest or with exertion, palpitations, PND, orthopnea, or leg swelling. Denies cough, fever, chills. Denies nausea, vomiting. Denies syncope or presyncope. Denies dizziness or lightheadedness.    Past Medical History:  Diagnosis Date  . BCC (basal cell carcinoma) 04/18/2005   Right mid thigh (CX35FU)  . BCC (basal cell carcinoma) 04/07/2009   right shoulder blade (Cx3)  . Hyperlipidemia   . Hypertension   . Insomnia   . Nodular basal cell carcinoma (BCC) 08/05/2013   right shoulder blade (EXC)  . SCC (squamous cell carcinoma) 02/19/2001   left arm (CX35FU)  . SCC (squamous cell carcinoma) 04/23/2013   left shin (txpbx)  . SCC (squamous cell carcinoma) 08/05/2013   left shin (txpbx)  . SCC (squamous cell carcinoma) 05/07/2014   Left shin (txpbx)  . SCC (squamous cell carcinoma) 08/04/2014   left shin (MOHS)  . Superficial basal cell carcinoma (BCC) 07/13/2015   right thigh (tx p bx)    Past Surgical History:  Procedure Laterality Date  . CESAREAN SECTION    . THORACIC SPINE SURGERY     Dr. Tonita Cong  . TUBAL LIGATION      Current Medications: Current Meds   Medication Sig  . acetaminophen-codeine (TYLENOL #3) 300-30 MG tablet Take 1 tablet by mouth daily as needed for moderate pain.  . calcium carbonate (TUMS - DOSED IN MG ELEMENTAL CALCIUM) 500 MG chewable tablet Chew by mouth as needed.   . cilostazol (PLETAL) 50 MG tablet Take 1 tablet (50 mg total) by mouth 2 (two) times daily.  . methocarbamol (ROBAXIN) 500 MG tablet Take 1 tablet (500 mg total) by mouth every 8 (eight) hours as needed for muscle spasms.  Marland Kitchen oxybutynin (DITROPAN-XL) 5 MG 24 hr tablet Take 5 mg by mouth at bedtime. Take 1/2 to 1 tablet daily  . pregabalin (LYRICA) 50 MG capsule Take 1 capsule (50 mg total) by mouth at bedtime for 7 days, THEN 1 capsule (50 mg total) 2 (two) times daily for 23 days.  . Probiotic Product (PROBIOTIC-10 PO) Take by mouth.  . tretinoin (RETIN-A) 0.05 % cream APPLY TO AFFECTED AREA EXTERNALLY AT BEDTIME  . valsartan-hydrochlorothiazide (DIOVAN-HCT) 160-12.5 MG tablet Take 1 tablet by mouth daily.  Marland Kitchen zolpidem (AMBIEN) 10 MG tablet Take 1 tablet by mouth at bedtime.  . [DISCONTINUED] amLODipine (NORVASC) 5 MG tablet Take 1 tablet by mouth daily.     Allergies:   Tramadol, Benazepril, Cephalexin, and Penicillins   Social History   Tobacco Use  . Smoking status: Former Research scientist (life sciences)  . Smokeless tobacco: Never Used  Vaping Use  . Vaping Use: Never used  Family History: The patient's family history is not on file.  ROS:   Please see the history of present illness.    All other systems reviewed and are negative.  EKGs/Labs/Other Studies Reviewed:    The following studies were reviewed today:  Recent Labs: No results found for requested labs within last 8760 hours.  Recent Lipid Panel    Component Value Date/Time   CHOL 228 (H) 09/30/2019 0950   TRIG 105 09/30/2019 0950   HDL 95 09/30/2019 0950   CHOLHDL 2.4 09/30/2019 0950   LDLCALC 115 (H) 09/30/2019 0950    Physical Exam:    VS:  BP 130/70   Pulse 72   Ht 5' 6.5" (1.689 m)    Wt 152 lb 9.6 oz (69.2 kg)   SpO2 97%   BMI 24.26 kg/m     Wt Readings from Last 5 Encounters:  08/31/20 152 lb 9.6 oz (69.2 kg)  08/12/20 160 lb (72.6 kg)  06/01/20 157 lb 12.8 oz (71.6 kg)  03/02/20 159 lb 9.6 oz (72.4 kg)  12/30/19 156 lb 3.2 oz (70.9 kg)    Constitutional: No acute distress Eyes: sclera non-icteric, normal conjunctiva and lids ENMT: normal dentition, moist mucous membranes Cardiovascular: regular rhythm, normal rate, no murmurs. S1 and S2 normal. Radial pulses normal bilaterally. No jugular venous distention.  Respiratory: clear to auscultation bilaterally GI : normal bowel sounds, soft and nontender. No distention.   MSK: extremities warm, well perfused. No edema.  NEURO: grossly nonfocal exam, moves all extremities. PSYCH: alert and oriented x 3, normal mood and affect.   ASSESSMENT:    1. PAD (peripheral artery disease) ([place])   2. Essential hypertension   3. LBBB (left bundle branch block)   4. Hyperlipidemia LDL goal <70    PLAN:    PAD (peripheral artery disease) ([facility]) -Stable, tolerating cilostazol.  Continue aspirin 81 mg daily, Crestor 20 mg daily.   Hyperlipidemia, unspecified hyperlipidemia type-continue Crestor 20 mg daily.  Lipids can be repeated at her next primary care appointment.    Essential hypertension-continue valsartan HCTZ 160-12.5 mg, BP mildly elevated, increase amlodipine to 10 mg daily.    LBBB (left bundle branch block)-no abnormalities on echocardiogram.  No chest pain.  Total time of encounter: 30 minutes total time of encounter, including 20 minutes spent in face-to-face patient care on the date of this encounter. This time includes coordination of care and counseling regarding above mentioned problem list. Remainder of non-face-to-face time involved reviewing chart documents/testing relevant to the patient encounter and documentation in the medical record. I have independently reviewed documentation from referring provider.    Cherlynn Kaiser, MD [place]  [facility]    Medication Adjustments/Labs and Tests Ordered: Current medicines are reviewed at length with the patient today.  Concerns regarding medicines are outlined above.   No orders of the defined types were placed in this encounter.    Meds ordered this encounter  Medications  . amLODipine (NORVASC) 5 MG tablet    Sig: Take 2 tablets (10 mg total) by mouth daily.    Dispense:  60 tablet    Refill:  3    Patient Instructions  Medication Instructions:  INCREASE AMLODIPINE to 10mg  DAILY *If you need a refill on your cardiac medications before your next appointment, please call your pharmacy*  Follow-Up: At [facility], you and your health needs are our priority.  As part of our continuing mission to provide you with exceptional heart care, we have created designated  Provider Care Teams.  These Care Teams include your primary Cardiologist (physician) and Advanced Practice Providers (APPs -  Physician Assistants and Nurse Practitioners) who all work together to provide you with the care you need, when you need it.  Your next appointment:   3 month(s)  The format for your next appointment:   In Person  Provider:   Weston Brass, MD

## 2020-08-31 NOTE — Patient Instructions (Signed)
Medication Instructions:  INCREASE AMLODIPINE to 10mg  DAILY *If you need a refill on your cardiac medications before your next appointment, please call your pharmacy*  Follow-Up: At [facility], you and your health needs are our priority.  As part of our continuing mission to provide you with exceptional heart care, we have created designated Provider Care Teams.  These Care Teams include your primary Cardiologist (physician) and Advanced Practice Providers (APPs -  Physician Assistants and Nurse Practitioners) who all work together to provide you with the care you need, when you need it.  Your next appointment:   3 month(s)  The format for your next appointment:   In Person  Provider:   [facility], MD

## 2020-09-15 DIAGNOSIS — Z961 Presence of intraocular lens: Secondary | ICD-10-CM | POA: Diagnosis not present

## 2020-09-15 DIAGNOSIS — H353111 Nonexudative age-related macular degeneration, right eye, early dry stage: Secondary | ICD-10-CM | POA: Diagnosis not present

## 2020-09-15 DIAGNOSIS — H5203 Hypermetropia, bilateral: Secondary | ICD-10-CM | POA: Diagnosis not present

## 2020-09-21 ENCOUNTER — Other Ambulatory Visit: Payer: Self-pay

## 2020-09-21 ENCOUNTER — Ambulatory Visit: Payer: Medicare HMO | Admitting: Physician Assistant

## 2020-09-21 ENCOUNTER — Encounter: Payer: Self-pay | Admitting: Physician Assistant

## 2020-09-21 DIAGNOSIS — Z1283 Encounter for screening for malignant neoplasm of skin: Secondary | ICD-10-CM

## 2020-09-21 DIAGNOSIS — Z85828 Personal history of other malignant neoplasm of skin: Secondary | ICD-10-CM | POA: Diagnosis not present

## 2020-09-21 NOTE — Progress Notes (Signed)
   Follow-Up Visit   Subjective  Jodi Cline is a 80 y.o. female who presents for the following: Annual Exam (No new concerns).   The following portions of the chart were reviewed this encounter and updated as appropriate:  Allergies  Meds  Problems      Objective  Well appearing patient in no apparent distress; mood and affect are within normal limits.  A full examination was performed including scalp, head, eyes, ears, nose, lips, neck, chest, axillae, abdomen, back, buttocks, bilateral upper extremities, bilateral lower extremities, hands, feet, fingers, toes, fingernails, and toenails. All findings within normal limits unless otherwise noted below.  Objective  Neck - Anterior: Multiple white scar-clear  Objective  Right Breast: Multiple white scar- clear  Objective  Left Breast: Full  body skin examination- no atypical moles or non mole skin cancer   Assessment & Plan  History of squamous cell carcinoma of skin Neck - Anterior  Yearly skin check  History of basal cell cancer Right Breast  Yearly skin check  Encounter for screening for malignant neoplasm of skin Left Breast    I, Berle Fitz, PA-C, have reviewed all documentation's for this visit.  The documentation on 09/21/20 for the exam, diagnosis, procedures and orders are all accurate and complete.

## 2020-09-23 ENCOUNTER — Ambulatory Visit: Payer: Medicare HMO | Admitting: Specialist

## 2020-09-23 DIAGNOSIS — M5459 Other low back pain: Secondary | ICD-10-CM | POA: Diagnosis not present

## 2020-09-29 ENCOUNTER — Ambulatory Visit: Payer: Self-pay | Admitting: Orthopedic Surgery

## 2020-10-14 ENCOUNTER — Ambulatory Visit: Payer: Medicare HMO | Admitting: Specialist

## 2020-10-26 ENCOUNTER — Telehealth: Payer: Self-pay | Admitting: Internal Medicine

## 2020-10-26 MED ORDER — ROSUVASTATIN CALCIUM 20 MG PO TABS: 20.0000 mg | ORAL_TABLET | Freq: Every day | ORAL | 3 refills | Status: DC

## 2020-10-26 NOTE — Telephone Encounter (Signed)
Returned call to patient, patient made aware of Dr. Delphina Cahill recommendations. Patient states that she is going to stay on the 20 mg dosage of Crestor. Patient states she does not wish to increase this at this time. Patient states she does need a refill of Crestor. Advised patient that Crestor refill has been sent in to patient preferred pharmacy. Advised patient to call back to office with any issues, questions, or concerns. Patient verbalized understanding.

## 2020-10-26 NOTE — Telephone Encounter (Signed)
This was increased because LDL is not at goal of <70. LDL is still not at goal based on labs from 07/01/20 obtained with PCP, LDL is 94. We've discussed intensifying further, but if she is having no symptoms at crestor 20 mg daily, reasonable to stay at this dose. If she would like to increase to crestor 40 mg daily I would suggest that so we are aggressively reducing cardiovascular risk.  Please let me know what she wants to do - if increasing dose please repeat lipids/cmp 3 mo.

## 2020-10-26 NOTE — Addendum Note (Signed)
Addended by: Rexanne Mano B on: 10/26/2020 03:40 PM   Modules accepted: Orders

## 2020-10-26 NOTE — Addendum Note (Signed)
Addended by: Rexanne Mano B on: 10/26/2020 03:24 PM   Modules accepted: Orders

## 2020-10-26 NOTE — Telephone Encounter (Signed)
Returned call to patient who states that she does not remember why her crestor was increased from 10mg  to 20mg . Patient states that her PCP was asking why it was increased and when.   Advised patient upon chart review it looks as if the crestor was increased in 09/29/2019 due to "evidence of coronary artery calcium on a CT scan from a few years ago"- Per Dr. Margaretann Loveless.    Per OV note with Dr. Margaretann Loveless (09/26/19)  "HLD - lipids have not been checked recently, however given CAC she has indications for high intensity statin therapy for secondary prevention of CAD. We will check lipids to ensure therapy is optimal and will intensify statin today".   Advised patient why and when medication was increased.   Patient would like to verify with Dr. Margaretann Loveless about this change and why. Advised patient I would forward message to Dr. Margaretann Loveless for review. Patient verbalized understanding.

## 2020-10-26 NOTE — Telephone Encounter (Signed)
Pt c/o medication issue:  1. Name of Medication: rosuvastatin (CRESTOR) 20 MG tablet [[id number]] ENDED   2. How are you currently taking this medication (dosage and times per day)? Take 1 tablet (20 mg total) by mouth daily.  3. Are you having a reaction (difficulty breathing--STAT)? No   4. What is your medication issue? Pt is not sure when this med changed from 10 mg to 20 mg.  She has trouble with statin drugs .  She would like to speak to dr Margaretann Loveless nurse to ask about why her med dose was changed   Best number  [phone]

## 2020-11-01 ENCOUNTER — Ambulatory Visit: Payer: Self-pay | Admitting: Orthopedic Surgery

## 2020-11-01 NOTE — H&P (View-Only) (Signed)
Jodi Cline is an 80 y.o. female.   Chief Complaint: back and leg pain HPI: Quality: aching Severity: moderate Duration: 3 months Timing: chronic Context: Patient states her pain in her low back started around October. NKI Has been seeing Dr Marlou Sa. She's had several injections. Alleviating Factors: position change; OTC medication Aggravating Factors: sitting; standing Associated Symptoms: weakness; numbness; tingling; radiation down leg (right) Previous Surgery: surgical procedure: (microdiscectomy); date: (2001) Prior Imaging: x ray; MRI Previous Injections: did not help; helped temporarily (The second one helped for about a month.) Previous PT: none This patient is operated on in the past. She was seen by Dr. Marlou Sa who obtained an MRI of her back due to right leg pain. Patient was found to have spinal stenosis and a synovial cyst at L4-5 on the right. I reviewing her outside medical records and her MRI. She has had an epidural steroid injection as well as a facet injection with no lasting relief. The first epidural gave her relief the second did not. She is having disabling pain down the right leg.  Past Medical History:  Diagnosis Date  . BCC (basal cell carcinoma) 04/18/2005   Right mid thigh (CX35FU)  . BCC (basal cell carcinoma) 04/07/2009   right shoulder blade (Cx3)  . Hyperlipidemia   . Hypertension   . Insomnia   . Nodular basal cell carcinoma (BCC) 08/05/2013   right shoulder blade (EXC)  . SCC (squamous cell carcinoma) 02/19/2001   left arm (CX35FU)  . SCC (squamous cell carcinoma) 04/23/2013   left shin (txpbx)  . SCC (squamous cell carcinoma) 08/05/2013   left shin (txpbx)  . SCC (squamous cell carcinoma) 05/07/2014   Left shin (txpbx)  . SCC (squamous cell carcinoma) 08/04/2014   left shin (MOHS)  . Superficial basal cell carcinoma (BCC) 07/13/2015   right thigh (tx p bx)    Past Surgical History:  Procedure Laterality Date  . CESAREAN SECTION    . THORACIC  SPINE SURGERY     Dr. Tonita Cong  . TUBAL LIGATION      No family history on file. Social History:  reports that she has quit smoking. She has never used smokeless tobacco. No history on file for alcohol use and drug use.  Allergies:  Allergies  Allergen Reactions  . Tramadol Swelling    Lips swell  . Benazepril Swelling    Lip swelling  . Cephalexin Swelling    Lips Swelling  . Penicillins Swelling    Reaction: unknown   Medications: acetaminophen 300 mg-codeine 30 mg tablet amLODIPine 5 mg tablet calcium carbonate 500 mg calcium (1,250 mg) chewable tablet cilostazoL 50 mg tablet doxycycline hyclate 100 mg capsule doxycycline hyclate 50 mg capsule hydroCHLOROthiazide 25 mg tablet HYDROcodone 5 mg-acetaminophen 325 mg tablet hyoscyamine ER 0.375 mg tablet,extended release,12 hr oxybutynin chloride 5 mg tablet potassium chloride ER 10 mEq tablet,extended release(part/cryst) Probiotic rosuvastatin 20 mg tablet tretinoin 0.05 % topical cream valsartan 160 mg-hydrochlorothiazide 12.5 mg tablet zolpidem 10 mg tablet  Review of Systems  Constitutional: Negative.   HENT: Negative.   Eyes: Negative.   Respiratory: Negative.   Cardiovascular: Negative.   Gastrointestinal: Negative.   Endocrine: Negative.   Genitourinary: Negative.   Musculoskeletal: Positive for back pain.  Neurological: Positive for weakness and numbness.    There were no vitals taken for this visit. Physical Exam HENT:     Head: Normocephalic.     Right Ear: External ear normal.     Left Ear: External ear normal.  Nose: Nose normal.     Mouth/Throat:     Pharynx: Oropharynx is clear.  Eyes:     Conjunctiva/sclera: Conjunctivae normal.  Cardiovascular:     Rate and Rhythm: Normal rate and regular rhythm.     Pulses: Normal pulses.  Pulmonary:     Effort: Pulmonary effort is normal.  Abdominal:     General: Bowel sounds are normal.  Musculoskeletal:     Cervical back: Normal range of motion.      Comments: Patient is an 80 year old female.  Gait and Station: Appearance: ambulating with no assistive devices and antalgic gait.  Constitutional: General Appearance: healthy-appearing and distress (mild).  Psychiatric: Mood and Affect: active and alert.  Cardiovascular System: Edema Right: none; Dorsalis and posterior tibial pulses 2+. Edema Left: none.  Abdomen: Inspection and Palpation: non-distended and no tenderness.  Skin: Inspection and palpation: no rash.  Lumbar Spine: Inspection: normal alignment. Bony Palpation of the Lumbar Spine: tender at lumbosacral junction.. Bony Palpation of the Right Hip: no tenderness of the greater trochanter and tenderness of the SI joint; Pelvis stable. Bony Palpation of the Left Hip: no tenderness of the greater trochanter and tenderness of the SI joint. Soft Tissue Palpation on the Right: No flank pain with percussion. Active Range of Motion: limited flexion and extention.  Motor Strength: L1 Motor Strength on the Right: hip flexion iliopsoas 5/5. L1 Motor Strength on the Left: hip flexion iliopsoas 5/5. L2-L4 Motor Strength on the Right: knee extension quadriceps 5/5. L2-L4 Motor Strength on the Left: knee extension quadriceps 5/5. L5 Motor Strength on the Right: ankle dorsiflexion tibialis anterior 5/5 and great toe extension extensor hallucis longus 5/5. L5 Motor Strength on the Left: ankle dorsiflexion tibialis anterior 5/5 and great toe extension extensor hallucis longus 5/5. S1 Motor Strength on the Right: plantar flexion gastrocnemius 5/5. S1 Motor Strength on the Left: plantar flexion gastrocnemius 5/5.  Neurological System: Knee Reflex Right: normal (2). Knee Reflex Left: normal (2). Ankle Reflex Right: normal (2). Ankle Reflex Left: normal (2). Babinski Reflex Right: plantar reflex absent. Babinski Reflex Left: plantar reflex absent. Sensation on the Right: normal distal extremities. Sensation on the Left: normal distal extremities. Special  Tests on the Right: no clonus of the ankle/knee and seated straight leg raising test positive. Special Tests on the Left: no clonus of the ankle/knee.  Neurological:     Mental Status: She is alert.    Three-view x-rays of the lumbar spine demonstrates previous laminotomy at L5-S1. Disc base narrowing at L4-5 and L5-S1. Mild calcification of the aorta. Slight offset at L4-5. No instability in flexion extension.  Outside MRI independently reviewed by myself demonstrates moderate spinal stenosis at L4-5. Ligamentum flavum hypertrophy a cyst off the right facet joint displacing the thecal sac and the L5 nerve root on the right. Also a synovial cyst projecting posteriorly from the L4-5 facet  Assessment/Plan Impression:  Refractory L5 radiculopathy on the right secondary to ligamentum flavum hypertrophy and a synovial cyst. Facet arthropathy minimal offset at L4-5 without instability  Refractory physical therapy activity modification facet injection epidural steroid injections which gave her temporary relief  Plan:  Discussed living with her symptoms of versus microlumbar decompression L4-5 and excision of synovial cyst she is having some radiating pain down the left side may be ligamentum flavum hypertrophy on the left. She may require central decompression therefore.  I had an extensive discussion with the patient concerning the pathology relevant anatomy and treatment options. At this point exhausting conservative  treatment and in the presence of a neurologic deficit we discussed microlumbar decompression. I discussed the risks and benefits including bleeding, infection, DVT, PE, anesthetic complications, worsening in their symptoms, improvement in their symptoms, C SF leakage, epidural fibrosis, need for future surgeries such as revision discectomy and lumbar fusion. I also indicated that this is an operation to basically decompress the nerve root to allow recovery as opposed to fixing a herniated  disc and that the incidence of recurrent chest disc herniation can approach 15%. Also that nerve root recovery is variable and may not recover completely.  I discussed the operative course including overnight in the hospital. Immediate ambulation. Follow-up in 2 weeks for suture removal. 6 weeks until healing of the herniation followed by 6 weeks of reconditioning and strengthening of the core musculature. Also discussed the need to employ the concepts of disc pressure management and core motion following the surgery to minimize the risk of recurrent disc herniation. We will obtain preoperative clearance i if necessary and proceed accordingly.  Plan microlumbar decompression L4-5, excision of synovial cyst  Cecilie Kicks, PA-C for Dr. Tonita Cong 11/01/2020, 10:56 AM

## 2020-11-01 NOTE — H&P (Signed)
Jodi Cline is an 80 y.o. female.   Chief Complaint: back and leg pain HPI: Quality: aching Severity: moderate Duration: 3 months Timing: chronic Context: Patient states her pain in her low back started around October. NKI Has been seeing Dr Marlou Sa. She's had several injections. Alleviating Factors: position change; OTC medication Aggravating Factors: sitting; standing Associated Symptoms: weakness; numbness; tingling; radiation down leg (right) Previous Surgery: surgical procedure: (microdiscectomy); date: (2001) Prior Imaging: x ray; MRI Previous Injections: did not help; helped temporarily (The second one helped for about a month.) Previous PT: none This patient is operated on in the past. She was seen by Dr. Marlou Sa who obtained an MRI of her back due to right leg pain. Patient was found to have spinal stenosis and a synovial cyst at L4-5 on the right. I reviewing her outside medical records and her MRI. She has had an epidural steroid injection as well as a facet injection with no lasting relief. The first epidural gave her relief the second did not. She is having disabling pain down the right leg.  Past Medical History:  Diagnosis Date  . BCC (basal cell carcinoma) 04/18/2005   Right mid thigh (CX35FU)  . BCC (basal cell carcinoma) 04/07/2009   right shoulder blade (Cx3)  . Hyperlipidemia   . Hypertension   . Insomnia   . Nodular basal cell carcinoma (BCC) 08/05/2013   right shoulder blade (EXC)  . SCC (squamous cell carcinoma) 02/19/2001   left arm (CX35FU)  . SCC (squamous cell carcinoma) 04/23/2013   left shin (txpbx)  . SCC (squamous cell carcinoma) 08/05/2013   left shin (txpbx)  . SCC (squamous cell carcinoma) 05/07/2014   Left shin (txpbx)  . SCC (squamous cell carcinoma) 08/04/2014   left shin (MOHS)  . Superficial basal cell carcinoma (BCC) 07/13/2015   right thigh (tx p bx)    Past Surgical History:  Procedure Laterality Date  . CESAREAN SECTION    . THORACIC  SPINE SURGERY     Dr. Tonita Cong  . TUBAL LIGATION      No family history on file. Social History:  reports that she has quit smoking. She has never used smokeless tobacco. No history on file for alcohol use and drug use.  Allergies:  Allergies  Allergen Reactions  . Tramadol Swelling    Lips swell  . Benazepril Swelling    Lip swelling  . Cephalexin Swelling    Lips Swelling  . Penicillins Swelling    Reaction: unknown   Medications: acetaminophen 300 mg-codeine 30 mg tablet amLODIPine 5 mg tablet calcium carbonate 500 mg calcium (1,250 mg) chewable tablet cilostazoL 50 mg tablet doxycycline hyclate 100 mg capsule doxycycline hyclate 50 mg capsule hydroCHLOROthiazide 25 mg tablet HYDROcodone 5 mg-acetaminophen 325 mg tablet hyoscyamine ER 0.375 mg tablet,extended release,12 hr oxybutynin chloride 5 mg tablet potassium chloride ER 10 mEq tablet,extended release(part/cryst) Probiotic rosuvastatin 20 mg tablet tretinoin 0.05 % topical cream valsartan 160 mg-hydrochlorothiazide 12.5 mg tablet zolpidem 10 mg tablet  Review of Systems  Constitutional: Negative.   HENT: Negative.   Eyes: Negative.   Respiratory: Negative.   Cardiovascular: Negative.   Gastrointestinal: Negative.   Endocrine: Negative.   Genitourinary: Negative.   Musculoskeletal: Positive for back pain.  Neurological: Positive for weakness and numbness.    There were no vitals taken for this visit. Physical Exam HENT:     Head: Normocephalic.     Right Ear: External ear normal.     Left Ear: External ear normal.  Nose: Nose normal.     Mouth/Throat:     Pharynx: Oropharynx is clear.  Eyes:     Conjunctiva/sclera: Conjunctivae normal.  Cardiovascular:     Rate and Rhythm: Normal rate and regular rhythm.     Pulses: Normal pulses.  Pulmonary:     Effort: Pulmonary effort is normal.  Abdominal:     General: Bowel sounds are normal.  Musculoskeletal:     Cervical back: Normal range of motion.      Comments: Patient is an 80 year old female.  Gait and Station: Appearance: ambulating with no assistive devices and antalgic gait.  Constitutional: General Appearance: healthy-appearing and distress (mild).  Psychiatric: Mood and Affect: active and alert.  Cardiovascular System: Edema Right: none; Dorsalis and posterior tibial pulses 2+. Edema Left: none.  Abdomen: Inspection and Palpation: non-distended and no tenderness.  Skin: Inspection and palpation: no rash.  Lumbar Spine: Inspection: normal alignment. Bony Palpation of the Lumbar Spine: tender at lumbosacral junction.. Bony Palpation of the Right Hip: no tenderness of the greater trochanter and tenderness of the SI joint; Pelvis stable. Bony Palpation of the Left Hip: no tenderness of the greater trochanter and tenderness of the SI joint. Soft Tissue Palpation on the Right: No flank pain with percussion. Active Range of Motion: limited flexion and extention.  Motor Strength: L1 Motor Strength on the Right: hip flexion iliopsoas 5/5. L1 Motor Strength on the Left: hip flexion iliopsoas 5/5. L2-L4 Motor Strength on the Right: knee extension quadriceps 5/5. L2-L4 Motor Strength on the Left: knee extension quadriceps 5/5. L5 Motor Strength on the Right: ankle dorsiflexion tibialis anterior 5/5 and great toe extension extensor hallucis longus 5/5. L5 Motor Strength on the Left: ankle dorsiflexion tibialis anterior 5/5 and great toe extension extensor hallucis longus 5/5. S1 Motor Strength on the Right: plantar flexion gastrocnemius 5/5. S1 Motor Strength on the Left: plantar flexion gastrocnemius 5/5.  Neurological System: Knee Reflex Right: normal (2). Knee Reflex Left: normal (2). Ankle Reflex Right: normal (2). Ankle Reflex Left: normal (2). Babinski Reflex Right: plantar reflex absent. Babinski Reflex Left: plantar reflex absent. Sensation on the Right: normal distal extremities. Sensation on the Left: normal distal extremities. Special  Tests on the Right: no clonus of the ankle/knee and seated straight leg raising test positive. Special Tests on the Left: no clonus of the ankle/knee.  Neurological:     Mental Status: She is alert.    Three-view x-rays of the lumbar spine demonstrates previous laminotomy at L5-S1. Disc base narrowing at L4-5 and L5-S1. Mild calcification of the aorta. Slight offset at L4-5. No instability in flexion extension.  Outside MRI independently reviewed by myself demonstrates moderate spinal stenosis at L4-5. Ligamentum flavum hypertrophy a cyst off the right facet joint displacing the thecal sac and the L5 nerve root on the right. Also a synovial cyst projecting posteriorly from the L4-5 facet  Assessment/Plan Impression:  Refractory L5 radiculopathy on the right secondary to ligamentum flavum hypertrophy and a synovial cyst. Facet arthropathy minimal offset at L4-5 without instability  Refractory physical therapy activity modification facet injection epidural steroid injections which gave her temporary relief  Plan:  Discussed living with her symptoms of versus microlumbar decompression L4-5 and excision of synovial cyst she is having some radiating pain down the left side may be ligamentum flavum hypertrophy on the left. She may require central decompression therefore.  I had an extensive discussion with the patient concerning the pathology relevant anatomy and treatment options. At this point exhausting conservative  treatment and in the presence of a neurologic deficit we discussed microlumbar decompression. I discussed the risks and benefits including bleeding, infection, DVT, PE, anesthetic complications, worsening in their symptoms, improvement in their symptoms, C SF leakage, epidural fibrosis, need for future surgeries such as revision discectomy and lumbar fusion. I also indicated that this is an operation to basically decompress the nerve root to allow recovery as opposed to fixing a herniated  disc and that the incidence of recurrent chest disc herniation can approach 15%. Also that nerve root recovery is variable and may not recover completely.  I discussed the operative course including overnight in the hospital. Immediate ambulation. Follow-up in 2 weeks for suture removal. 6 weeks until healing of the herniation followed by 6 weeks of reconditioning and strengthening of the core musculature. Also discussed the need to employ the concepts of disc pressure management and core motion following the surgery to minimize the risk of recurrent disc herniation. We will obtain preoperative clearance i if necessary and proceed accordingly.  Plan microlumbar decompression L4-5, excision of synovial cyst  Cecilie Kicks, PA-C for Dr. Tonita Cong 11/01/2020, 10:56 AM

## 2020-11-09 ENCOUNTER — Encounter (HOSPITAL_COMMUNITY): Payer: Self-pay

## 2020-11-09 ENCOUNTER — Other Ambulatory Visit: Payer: Self-pay

## 2020-11-09 ENCOUNTER — Other Ambulatory Visit (HOSPITAL_COMMUNITY): Payer: Medicare HMO

## 2020-11-09 ENCOUNTER — Encounter (HOSPITAL_COMMUNITY)
Admission: RE | Admit: 2020-11-09 | Discharge: 2020-11-09 | Disposition: A | Discharge status: Home / Self Care | Payer: Medicare HMO | Source: Ambulatory Visit | Attending: Specialist | Admitting: Specialist

## 2020-11-09 DIAGNOSIS — Z20822 Contact with and (suspected) exposure to covid-19: Secondary | ICD-10-CM | POA: Diagnosis not present

## 2020-11-09 DIAGNOSIS — Z01812 Encounter for preprocedural laboratory examination: Secondary | ICD-10-CM | POA: Diagnosis not present

## 2020-11-09 HISTORY — DX: Peripheral vascular disease, unspecified: I73.9

## 2020-11-09 HISTORY — DX: Anxiety disorder, unspecified: F41.9

## 2020-11-09 HISTORY — DX: Gastro-esophageal reflux disease without esophagitis: K21.9

## 2020-11-09 HISTORY — DX: Anemia, unspecified: D64.9

## 2020-11-09 HISTORY — DX: Unspecified osteoarthritis, unspecified site: M19.90

## 2020-11-09 HISTORY — DX: Pneumonia, unspecified organism: J18.9

## 2020-11-09 HISTORY — DX: Depression, unspecified: F32.A

## 2020-11-09 LAB — BASIC METABOLIC PANEL
Anion gap: 9 (ref 5–15)
BUN: 10 mg/dL (ref 8–23)
CO2: 27 mmol/L (ref 22–32)
Calcium: 9.8 mg/dL (ref 8.9–10.3)
Chloride: 99 mmol/L (ref 98–111)
Creatinine, Ser: 0.74 mg/dL (ref 0.44–1.00)
GFR, Estimated: >60 mL/min (ref >60)
Glucose, Bld: 102 mg/dL — ABNORMAL HIGH (ref 70–99)
Potassium: 3.5 mmol/L (ref 3.5–5.1)
Sodium: 135 mmol/L (ref 135–145)

## 2020-11-09 LAB — CBC
HCT: 40.1 % (ref 36.0–46.0)
Hemoglobin: 13.2 g/dL (ref 12.0–15.0)
MCH: 30.6 pg (ref 26.0–34.0)
MCHC: 32.9 g/dL (ref 30.0–36.0)
MCV: 93 fL (ref 80.0–100.0)
Platelets: 120 10*3/uL — ABNORMAL LOW (ref 150–400)
RBC: 4.31 MIL/uL (ref 3.87–5.11)
RDW: 13 % (ref 11.5–15.5)
WBC: 7.2 10*3/uL (ref 4.0–10.5)
nRBC: 0 % (ref 0.0–0.2)

## 2020-11-09 LAB — SURGICAL PCR SCREEN
MRSA, PCR: NEGATIVE
Staphylococcus aureus: NEGATIVE

## 2020-11-09 LAB — SARS CORONAVIRUS 2 (TAT 6-24 HRS): SARS Coronavirus 2: NEGATIVE

## 2020-11-09 NOTE — Progress Notes (Signed)
PCP - Dr. Lavone Orn Cardiologist - Dr. Margaretann Loveless  EKG - 06/01/20 ECHO - 10/08/19  ERAS Protocol - yes with Pre-Surgery Ensure   COVID TEST- done today 11/09/20  Anesthesia review: yes, EKG  Patient denies shortness of breath, fever, cough and chest pain at PAT appointment   All instructions explained to the patient, with a verbal understanding of the material. Patient agrees to go over the instructions while at home for a better understanding. Patient also instructed to self quarantine after being tested for COVID-19. The opportunity to ask questions was provided.

## 2020-11-09 NOTE — Pre-Procedure Instructions (Signed)
Jodi Cline  11/09/2020     Your procedure is scheduled on Thursday, November 11, 2020 at 7:30 AM.   Report to [facility] Entrance "A" Admitting Office at 5:30 AM.   Call this number if you have problems the morning of surgery: [phone]   Questions prior to day of surgery, please call [phone] between 8 & 4 PM.   Remember:  Do not eat or drink after midnight Wednesday, 11/10/20.  You may drink clear liquids until 4:30 AM.  Clear liquids allowed are: Water, Juice (non-citric and without pulp - diabetics please choose diet or no sugar options), Carbonated beverages - (diabetics please choose diet or no sugar options), Clear Tea, Black Coffee only (no creamer, milk or cream including half and half) and Gatorade (diabetics please choose diet or no sugar options)   Drink the Pre-Surgery Ensure just prior to 4:30 AM Thursday. This will be the last liquid you will have prior to surgery.    Take these medicines the morning of surgery with A SIP OF WATER: Amlodipine (Norvasc), Oxybutynin (Ditropan), Rosuvastatin (Crestor), Famotidine (Pepcid) - if needed, Acetaminophen-codeine (Tylenol #3) - if needed    Do not wear jewelry, make-up or nail polish.  Do not wear lotions, powders, perfumes or deodorant.  Do not shave 48 hours prior to surgery.    Do not bring valuables to the hospital.  [facility] is not responsible for any belongings or valuables.  Contacts, dentures or bridgework may not be worn into surgery.  Leave your suitcase in the car.  After surgery it may be brought to your room.  For patients admitted to the hospital, discharge time will be determined by your treatment team.  Patients discharged the day of surgery will not be allowed to drive home.   [facility] - Preparing for Surgery  Before surgery, you can play an important role.  Because skin is not sterile, your skin needs to be as free of germs as possible.  You can reduce the number of germs on you skin by  washing with CHG (chlorahexidine gluconate) soap before surgery.  CHG is an antiseptic cleaner which kills germs and bonds with the skin to continue killing germs even after washing.  Oral Hygiene is also important in reducing the risk of infection.  Remember to brush your teeth with your regular toothpaste the morning of surgery.  Please DO NOT use if you have an allergy to CHG or antibacterial soaps.  If your skin becomes reddened/irritated stop using the CHG and inform your nurse when you arrive at Short Stay.  Do not shave (including legs and underarms) for at least 48 hours prior to the first CHG shower.  You may shave your face.  Please follow these instructions carefully:   1.  Shower with CHG Soap the night before surgery and the morning of Surgery.  2.  If you choose to wash your hair, wash your hair first as usual with your normal shampoo.  3.  After you shampoo, rinse your hair and body thoroughly to remove the shampoo. 4.  Use CHG as you would any other liquid soap.  You can apply chg directly to the skin and wash gently with a      scrungie or washcloth.           5.  Apply the CHG Soap to your body ONLY FROM THE NECK DOWN.   Do not use on open wounds or open sores. Avoid contact with your  eyes, ears, mouth and genitals (private parts).  Wash genitals (private parts) with your normal soap, do this prior to using CHG soap.  6.  Wash thoroughly, paying special attention to the area where your surgery will be performed.  7.  Thoroughly rinse your body with warm water from the neck down.  8.  DO NOT shower/wash with your normal soap after using and rinsing off the CHG Soap.  9.  Pat yourself dry with a clean towel.            10.  Wear clean pajamas.            11.  Place clean sheets on your bed the night of your first shower and do not sleep with pets.  Day of Surgery  Shower as above. Do not apply any lotions/deoderants the morning of surgery.   Please wear clean clothes to the  hospital/surgery center. Remember to brush your teeth with toothpaste.  Please read over the fact sheets that you were given.

## 2020-11-10 ENCOUNTER — Ambulatory Visit: Payer: Self-pay | Admitting: Orthopedic Surgery

## 2020-11-10 ENCOUNTER — Encounter (HOSPITAL_COMMUNITY): Payer: Self-pay

## 2020-11-10 NOTE — Anesthesia Preprocedure Evaluation (Addendum)
Anesthesia Evaluation  Patient identified by MRN, date of birth, ID band Patient awake    Reviewed: Allergy & Precautions, H&P , NPO status , Patient's Chart, lab work & pertinent test results  Airway Mallampati: II   Neck ROM: full    Dental   Pulmonary former smoker,    breath sounds clear to auscultation       Cardiovascular hypertension, + Peripheral Vascular Disease   Rhythm:regular Rate:Normal     Neuro/Psych PSYCHIATRIC DISORDERS Anxiety Depression    GI/Hepatic GERD  ,  Endo/Other    Renal/GU      Musculoskeletal  (+) Arthritis ,   Abdominal   Peds  Hematology   Anesthesia Other Findings   Reproductive/Obstetrics                            Anesthesia Physical Anesthesia Plan  ASA: II  Anesthesia Plan: General   Post-op Pain Management:    Induction: Intravenous  PONV Risk Score and Plan: 3 and Ondansetron, Dexamethasone and Treatment may vary due to age or medical condition  Airway Management Planned: Oral ETT  Additional Equipment:   Intra-op Plan:   Post-operative Plan: Extubation in OR  Informed Consent: I have reviewed the patients History and Physical, chart, labs and discussed the procedure including the risks, benefits and alternatives for the proposed anesthesia with the patient or authorized representative who has indicated his/her understanding and acceptance.     Dental advisory given  Plan Discussed with: CRNA, Anesthesiologist and Surgeon  Anesthesia Plan Comments: (See APP note by Durel Salts, FNP )       Anesthesia Quick Evaluation

## 2020-11-10 NOTE — Progress Notes (Signed)
Anesthesia Chart Review:   Case: [id number] Date/Time: 11/11/20 0715   Procedure: Neta Ehlers decompression L4-5, excision of synovial cyst (N/A ) - 2 hrs   Anesthesia type: General   Pre-op diagnosis: Stenosis L4-5 synovial cyst L4-5 right   Location: [facility] ROOM 50 / [facility] OR   Surgeons: Susa Day, MD      DISCUSSION:  Pt is an 80 year old with hx LBBB, HTN, PAD, anemia   - LBBB 1st documented at initial cardiology visit 09/26/19 with Cherlynn Kaiser, MD. Per Dr. Delphina Cahill note "chronicity of her LBBB is unknown.  I have independently reviewed the images from a chest CT with contrast performed in October 2019.  This demonstrates aortic valve and coronary artery calcifications.  She and I discussed that disease in 1 vascular bed typically indicates high probability of disease in other vascular beds.  It is possible that her left bundle branch block is related to coronary artery disease.  Fortunately at the moment she is asymptomatic from a cardiopulmonary standpoint, however has limited her activity.  We at least need to obtain an echocardiogram to evaluate for regional wall motion abnormalities and ventricular function.  We will have a low threshold for stress testing particularly when she increases her activity for her peripheral vascular disease.  I have asked her to contact me if she has any chest discomfort or shortness of breath."  - Echo 10/08/19 did not demonstrate wall motion abnormalities  - At last cardiology visit 08/31/20, Dr. Margaretann Loveless documents "LBBB (left bundle branch block)-no abnormalities on echocardiogram. No chest pain"   VS: BP 131/65   Pulse 89   Temp 36.9 C (Oral)   Resp 17   Ht 5' 6.5" (1.689 m)   Wt 72.2 kg   SpO2 99%   BMI 25.31 kg/m   PROVIDERS: - PCP is Lavone Orn, MD  - PV Cardiologist is Kathlyn Sacramento, MD. Last office visit 06/01/20 - Cardiologist is Cherlynn Kaiser, MD. Last office visit 08/31/20   LABS: Labs reviewed: Acceptable for surgery. (all  labs ordered are listed, but only abnormal results are displayed)  Labs Reviewed  CBC - Abnormal; Notable for the following components:      Result Value   Platelets 120 (*)    All other components within normal limits  BASIC METABOLIC PANEL - Abnormal; Notable for the following components:   Glucose, Bld 102 (*)    All other components within normal limits  SURGICAL PCR SCREEN  SARS CORONAVIRUS 2 (TAT 6-24 HRS)     EKG 06/01/20: NSR. LBBB. Possible left atrial enlargement   CV: Echo 10/08/19:  1. Left ventricular ejection fraction by 3D volume is 57 %. The left ventricle has normal function. There is mildly increased concentric left ventricular hypertrophy. Left ventricular diastolic parameters are consistent with Grade I diastolic dysfunction (impaired relaxation).  2. Right ventricular systolic function is normal. The right ventricular size is normal. There is normal pulmonary artery systolic pressure.  3. The mitral valve is normal in structure and function. trivial mitral valve regurgitation. No evidence of mitral stenosis.  4. The aortic valve is tricuspid. Aortic valve regurgitation is trivial. Mild aortic valve sclerosis is present, with no evidence of aortic valve  stenosis.  5. Ascending aorta measures 38 mm at the proximal-mid ascending aorta. This may be upper limit of normal for age and indexed to BSA.  6. The inferior vena cava is normal in size with greater than 50% respiratory variability, suggesting right atrial pressure of 3  mmHg.    US aorta medicare screen 10/03/19:  - Abdominal Aorta: No evidence of an abdominal aortic aneurysm was  visualized. The largest aortic measurement is 1.8 cm.  - Stenosis: Mild to moderate wall calcification noted. No evidence of stenosis seen.  - IVC/Iliac: There is no evidence of thrombus involving the IVC.    Past Medical History:  Diagnosis Date  . Anemia   . Anxiety   . Arthritis   . BCC (basal cell carcinoma) 04/18/2005    Right mid thigh (CX35FU)  . BCC (basal cell carcinoma) 04/07/2009   right shoulder blade (Cx3)  . Depression   . GERD (gastroesophageal reflux disease)   . Hyperlipidemia   . Hypertension   . Insomnia   . Nodular basal cell carcinoma (BCC) 08/05/2013   right shoulder blade (EXC)  . PAD (peripheral artery disease) ([place])   . Pneumonia    as a child  . SCC (squamous cell carcinoma) 02/19/2001   left arm (CX35FU)  . SCC (squamous cell carcinoma) 04/23/2013   left shin (txpbx)  . SCC (squamous cell carcinoma) 08/05/2013   left shin (txpbx)  . SCC (squamous cell carcinoma) 05/07/2014   Left shin (txpbx)  . SCC (squamous cell carcinoma) 08/04/2014   left shin (MOHS)  . Superficial basal cell carcinoma (BCC) 07/13/2015   right thigh (tx p bx)    Past Surgical History:  Procedure Laterality Date  . CESAREAN SECTION    . COLONOSCOPY    . THORACIC SPINE SURGERY     Dr. Tonita Cong  . TUBAL LIGATION      MEDICATIONS: . acetaminophen-codeine (TYLENOL #3) 300-30 MG tablet  . amLODipine (NORVASC) 5 MG tablet  . calcium carbonate (TUMS - DOSED IN MG ELEMENTAL CALCIUM) 500 MG chewable tablet  . cilostazol (PLETAL) 50 MG tablet  . famotidine (PEPCID) 10 MG tablet  . methocarbamol (ROBAXIN) 500 MG tablet  . oxybutynin (DITROPAN-XL) 5 MG 24 hr tablet  . pregabalin (LYRICA) 50 MG capsule  . Probiotic Product (PROBIOTIC-10 PO)  . rosuvastatin (CRESTOR) 20 MG tablet  . tretinoin (RETIN-A) 0.05 % cream  . valsartan-hydrochlorothiazide (DIOVAN-HCT) 160-12.5 MG tablet  . zolpidem (AMBIEN) 10 MG tablet   No current facility-administered medications for this encounter.    If no changes, I anticipate pt can proceed with surgery as scheduled.   Willeen Cass, PhD, FNP-BC [facility] Short Stay [facility]/Anesthesiology Phone: [phone] 11/10/2020 9:24 AM

## 2020-11-11 ENCOUNTER — Ambulatory Visit (HOSPITAL_COMMUNITY): Payer: Medicare HMO

## 2020-11-11 ENCOUNTER — Ambulatory Visit (HOSPITAL_COMMUNITY): Payer: Medicare HMO | Admitting: Anesthesiology

## 2020-11-11 ENCOUNTER — Other Ambulatory Visit: Payer: Self-pay

## 2020-11-11 ENCOUNTER — Ambulatory Visit (HOSPITAL_COMMUNITY): Payer: Medicare HMO | Admitting: Emergency Medicine

## 2020-11-11 ENCOUNTER — Ambulatory Visit (HOSPITAL_COMMUNITY)
Admission: RE | Admit: 2020-11-11 | Discharge: 2020-11-12 | Disposition: A | Discharge status: Home / Self Care | Payer: Medicare HMO | Source: Ambulatory Visit | Attending: Specialist | Admitting: Specialist

## 2020-11-11 ENCOUNTER — Encounter (HOSPITAL_COMMUNITY): Payer: Self-pay | Admitting: Specialist

## 2020-11-11 ENCOUNTER — Encounter (HOSPITAL_COMMUNITY)
Admission: RE | Disposition: A | Discharge status: Home / Self Care | Payer: Self-pay | Source: Ambulatory Visit | Attending: Specialist

## 2020-11-11 DIAGNOSIS — R69 Illness, unspecified: Secondary | ICD-10-CM | POA: Diagnosis not present

## 2020-11-11 DIAGNOSIS — I10 Essential (primary) hypertension: Secondary | ICD-10-CM | POA: Diagnosis not present

## 2020-11-11 DIAGNOSIS — F418 Other specified anxiety disorders: Secondary | ICD-10-CM | POA: Diagnosis not present

## 2020-11-11 DIAGNOSIS — Z87891 Personal history of nicotine dependence: Secondary | ICD-10-CM | POA: Insufficient documentation

## 2020-11-11 DIAGNOSIS — M7138 Other bursal cyst, other site: Secondary | ICD-10-CM | POA: Insufficient documentation

## 2020-11-11 DIAGNOSIS — Z419 Encounter for procedure for purposes other than remedying health state, unspecified: Secondary | ICD-10-CM

## 2020-11-11 DIAGNOSIS — M5416 Radiculopathy, lumbar region: Secondary | ICD-10-CM | POA: Insufficient documentation

## 2020-11-11 DIAGNOSIS — M4316 Spondylolisthesis, lumbar region: Secondary | ICD-10-CM | POA: Insufficient documentation

## 2020-11-11 DIAGNOSIS — Z880 Allergy status to penicillin: Secondary | ICD-10-CM | POA: Diagnosis not present

## 2020-11-11 DIAGNOSIS — M5126 Other intervertebral disc displacement, lumbar region: Secondary | ICD-10-CM | POA: Diagnosis not present

## 2020-11-11 DIAGNOSIS — Z885 Allergy status to narcotic agent status: Secondary | ICD-10-CM | POA: Insufficient documentation

## 2020-11-11 DIAGNOSIS — Z881 Allergy status to other antibiotic agents status: Secondary | ICD-10-CM | POA: Diagnosis not present

## 2020-11-11 DIAGNOSIS — Z01818 Encounter for other preprocedural examination: Secondary | ICD-10-CM | POA: Diagnosis not present

## 2020-11-11 DIAGNOSIS — M48061 Spinal stenosis, lumbar region without neurogenic claudication: Secondary | ICD-10-CM | POA: Diagnosis not present

## 2020-11-11 DIAGNOSIS — Z888 Allergy status to other drugs, medicaments and biological substances status: Secondary | ICD-10-CM | POA: Insufficient documentation

## 2020-11-11 DIAGNOSIS — I700 Atherosclerosis of aorta: Secondary | ICD-10-CM | POA: Diagnosis not present

## 2020-11-11 DIAGNOSIS — I447 Left bundle-branch block, unspecified: Secondary | ICD-10-CM | POA: Diagnosis not present

## 2020-11-11 HISTORY — PX: LUMBAR LAMINECTOMY/DECOMPRESSION MICRODISCECTOMY: SHX5026

## 2020-11-11 SURGERY — LUMBAR LAMINECTOMY/DECOMPRESSION MICRODISCECTOMY 1 LEVEL
Anesthesia: General

## 2020-11-11 MED ORDER — DOCUSATE SODIUM 100 MG PO CAPS: 100.0000 mg | ORAL_CAPSULE | Freq: Two times a day (BID) | ORAL | 1 refills | Status: DC | PRN

## 2020-11-11 MED ORDER — HYDROCODONE-ACETAMINOPHEN 5-325 MG PO TABS
1.0000 | ORAL_TABLET | ORAL | Status: DC | PRN
  Administered 2020-11-11: 1 via ORAL

## 2020-11-11 MED ORDER — AMLODIPINE BESYLATE 5 MG PO TABS: 10.0000 mg | ORAL_TABLET | Freq: Every day | ORAL | Status: DC

## 2020-11-11 MED ORDER — POLYETHYLENE GLYCOL 3350 17 G PO PACK: 17.0000 g | PACK | Freq: Every day | ORAL | Status: DC | PRN

## 2020-11-11 MED ORDER — METHOCARBAMOL 500 MG PO TABS
ORAL_TABLET | ORAL | Status: AC
  Filled 2020-11-11: qty 1

## 2020-11-11 MED ORDER — BISACODYL 5 MG PO TBEC: 5.0000 mg | DELAYED_RELEASE_TABLET | Freq: Every day | ORAL | Status: DC | PRN

## 2020-11-11 MED ORDER — PROBIOTIC-10 PO CHEW: CHEWABLE_TABLET | ORAL | Status: DC

## 2020-11-11 MED ORDER — PREGABALIN 50 MG PO CAPS: 50.0000 mg | ORAL_CAPSULE | Freq: Two times a day (BID) | ORAL | Status: DC

## 2020-11-11 MED ORDER — METHOCARBAMOL 1000 MG/10ML IJ SOLN
500.0000 mg | Freq: Four times a day (QID) | INTRAVENOUS | Status: DC | PRN
  Filled 2020-11-11: qty 5

## 2020-11-11 MED ORDER — PROPOFOL 10 MG/ML IV BOLUS
INTRAVENOUS | Status: AC
  Filled 2020-11-11: qty 20

## 2020-11-11 MED ORDER — BUPIVACAINE-EPINEPHRINE 0.25% -1:200000 IJ SOLN
INTRAMUSCULAR | Status: DC | PRN
  Administered 2020-11-11: 7 mL

## 2020-11-11 MED ORDER — CHLORHEXIDINE GLUCONATE 0.12 % MT SOLN
OROMUCOSAL | Status: AC
  Administered 2020-11-11: 15 mL via OROMUCOSAL
  Filled 2020-11-11: qty 15

## 2020-11-11 MED ORDER — IRBESARTAN 150 MG PO TABS
150.0000 mg | ORAL_TABLET | Freq: Every day | ORAL | Status: DC
  Filled 2020-11-11: qty 1

## 2020-11-11 MED ORDER — DOCUSATE SODIUM 100 MG PO CAPS
100.0000 mg | ORAL_CAPSULE | Freq: Two times a day (BID) | ORAL | Status: DC
  Administered 2020-11-11 (×2): 100 mg via ORAL
  Filled 2020-11-11 (×2): qty 1

## 2020-11-11 MED ORDER — ALBUMIN HUMAN 5 % IV SOLN: INTRAVENOUS | Status: DC | PRN

## 2020-11-11 MED ORDER — ONDANSETRON HCL 4 MG/2ML IJ SOLN: 4.0000 mg | Freq: Four times a day (QID) | INTRAMUSCULAR | Status: DC | PRN

## 2020-11-11 MED ORDER — ACETAMINOPHEN 325 MG PO TABS: 650.0000 mg | ORAL_TABLET | ORAL | Status: DC | PRN

## 2020-11-11 MED ORDER — ALUM & MAG HYDROXIDE-SIMETH 200-200-20 MG/5ML PO SUSP: 30.0000 mL | Freq: Four times a day (QID) | ORAL | Status: DC | PRN

## 2020-11-11 MED ORDER — OXYCODONE-ACETAMINOPHEN 5-325 MG PO TABS: 1.0000 | ORAL_TABLET | ORAL | 0 refills | Status: DC | PRN

## 2020-11-11 MED ORDER — VANCOMYCIN HCL IN DEXTROSE 750-5 MG/150ML-% IV SOLN
750.0000 mg | Freq: Once | INTRAVENOUS | Status: AC
  Administered 2020-11-12: 750 mg via INTRAVENOUS
  Filled 2020-11-11: qty 150

## 2020-11-11 MED ORDER — FAMOTIDINE 20 MG PO TABS
10.0000 mg | ORAL_TABLET | Freq: Every day | ORAL | Status: DC | PRN
  Administered 2020-11-11: 10 mg via ORAL
  Filled 2020-11-11: qty 1

## 2020-11-11 MED ORDER — OXYCODONE HCL 5 MG PO TABS
ORAL_TABLET | ORAL | Status: AC
  Filled 2020-11-11: qty 1

## 2020-11-11 MED ORDER — ACETAMINOPHEN 10 MG/ML IV SOLN
1000.0000 mg | INTRAVENOUS | Status: AC
  Administered 2020-11-11: 1000 mg via INTRAVENOUS
  Filled 2020-11-11: qty 100

## 2020-11-11 MED ORDER — TRANEXAMIC ACID-NACL 1000-0.7 MG/100ML-% IV SOLN
1000.0000 mg | INTRAVENOUS | Status: AC
  Administered 2020-11-11: 1000 mg via INTRAVENOUS
  Filled 2020-11-11: qty 100

## 2020-11-11 MED ORDER — GENTAMICIN IN SALINE 1.6-0.9 MG/ML-% IV SOLN
INTRAVENOUS | Status: AC
  Filled 2020-11-11: qty 50

## 2020-11-11 MED ORDER — HYDROCHLOROTHIAZIDE 12.5 MG PO CAPS
12.5000 mg | ORAL_CAPSULE | Freq: Every day | ORAL | Status: DC
  Administered 2020-11-11: 12.5 mg via ORAL
  Filled 2020-11-11: qty 1

## 2020-11-11 MED ORDER — PHENYLEPHRINE 40 MCG/ML (10ML) SYRINGE FOR IV PUSH (FOR BLOOD PRESSURE SUPPORT)
PREFILLED_SYRINGE | INTRAVENOUS | Status: DC | PRN
  Administered 2020-11-11: 60 ug via INTRAVENOUS

## 2020-11-11 MED ORDER — THROMBIN 20000 UNITS EX SOLR
CUTANEOUS | Status: DC | PRN
  Administered 2020-11-11: 20 mL via TOPICAL

## 2020-11-11 MED ORDER — VALSARTAN-HYDROCHLOROTHIAZIDE 160-12.5 MG PO TABS: 1.0000 | ORAL_TABLET | Freq: Every day | ORAL | Status: DC

## 2020-11-11 MED ORDER — LACTATED RINGERS IV SOLN: INTRAVENOUS | Status: DC

## 2020-11-11 MED ORDER — THROMBIN 20000 UNITS EX SOLR
CUTANEOUS | Status: AC
  Filled 2020-11-11: qty 20000

## 2020-11-11 MED ORDER — ONDANSETRON HCL 4 MG PO TABS: 4.0000 mg | ORAL_TABLET | Freq: Four times a day (QID) | ORAL | Status: DC | PRN

## 2020-11-11 MED ORDER — FENTANYL CITRATE (PF) 100 MCG/2ML IJ SOLN
INTRAMUSCULAR | Status: AC
  Filled 2020-11-11: qty 2

## 2020-11-11 MED ORDER — BUPIVACAINE-EPINEPHRINE 0.5% -1:200000 IJ SOLN: INTRAMUSCULAR | Status: DC | PRN

## 2020-11-11 MED ORDER — OXYCODONE HCL 5 MG PO TABS
5.0000 mg | ORAL_TABLET | Freq: Once | ORAL | Status: AC | PRN
Start: 2020-11-11 — End: 2020-11-11
  Administered 2020-11-11: 5 mg via ORAL

## 2020-11-11 MED ORDER — ZOLPIDEM TARTRATE 5 MG PO TABS
5.0000 mg | ORAL_TABLET | Freq: Every day | ORAL | Status: DC
  Administered 2020-11-11: 5 mg via ORAL
  Filled 2020-11-11: qty 1

## 2020-11-11 MED ORDER — DEXAMETHASONE SODIUM PHOSPHATE 10 MG/ML IJ SOLN
INTRAMUSCULAR | Status: DC | PRN
  Administered 2020-11-11: 10 mg via INTRAVENOUS

## 2020-11-11 MED ORDER — HYDROCODONE-ACETAMINOPHEN 5-325 MG PO TABS
ORAL_TABLET | ORAL | Status: AC
  Filled 2020-11-11: qty 1

## 2020-11-11 MED ORDER — SUGAMMADEX SODIUM 200 MG/2ML IV SOLN
INTRAVENOUS | Status: DC | PRN
  Administered 2020-11-11: 180 mg via INTRAVENOUS

## 2020-11-11 MED ORDER — GENTAMICIN IN SALINE 1.6-0.9 MG/ML-% IV SOLN
80.0000 mg | INTRAVENOUS | Status: AC
  Administered 2020-11-11: 80 mg via INTRAVENOUS

## 2020-11-11 MED ORDER — VANCOMYCIN HCL IN DEXTROSE 1-5 GM/200ML-% IV SOLN
INTRAVENOUS | Status: AC
  Filled 2020-11-11: qty 200

## 2020-11-11 MED ORDER — LIDOCAINE 2% (20 MG/ML) 5 ML SYRINGE
INTRAMUSCULAR | Status: DC | PRN
  Administered 2020-11-11 (×2): 50 mg via INTRAVENOUS

## 2020-11-11 MED ORDER — HYDROMORPHONE HCL 1 MG/ML IJ SOLN: 0.5000 mg | INTRAMUSCULAR | Status: DC | PRN

## 2020-11-11 MED ORDER — OXYCODONE-ACETAMINOPHEN 5-325 MG PO TABS
1.0000 | ORAL_TABLET | ORAL | Status: DC | PRN
  Administered 2020-11-11: 2 via ORAL
  Administered 2020-11-11 – 2020-11-12 (×2): 1 via ORAL
  Filled 2020-11-11 (×2): qty 1
  Filled 2020-11-11 (×2): qty 2

## 2020-11-11 MED ORDER — POLYETHYLENE GLYCOL 3350 17 G PO PACK: 17.0000 g | PACK | Freq: Every day | ORAL | 0 refills | Status: DC

## 2020-11-11 MED ORDER — MENTHOL 3 MG MT LOZG: 1.0000 | LOZENGE | OROMUCOSAL | Status: DC | PRN

## 2020-11-11 MED ORDER — PHENOL 1.4 % MT LIQD: 1.0000 | OROMUCOSAL | Status: DC | PRN

## 2020-11-11 MED ORDER — OXYCODONE HCL 5 MG/5ML PO SOLN: 5.0000 mg | Freq: Once | ORAL | Status: AC | PRN

## 2020-11-11 MED ORDER — MAGNESIUM CITRATE PO SOLN: 1.0000 | Freq: Once | ORAL | Status: DC | PRN

## 2020-11-11 MED ORDER — 0.9 % SODIUM CHLORIDE (POUR BTL) OPTIME
TOPICAL | Status: DC | PRN
  Administered 2020-11-11: 1000 mL

## 2020-11-11 MED ORDER — ONDANSETRON HCL 4 MG/2ML IJ SOLN
INTRAMUSCULAR | Status: DC | PRN
  Administered 2020-11-11: 4 mg via INTRAVENOUS

## 2020-11-11 MED ORDER — CHLORHEXIDINE GLUCONATE 0.12 % MT SOLN: 15.0000 mL | Freq: Once | OROMUCOSAL | Status: AC

## 2020-11-11 MED ORDER — FENTANYL CITRATE (PF) 250 MCG/5ML IJ SOLN
INTRAMUSCULAR | Status: DC | PRN
  Administered 2020-11-11: 100 ug via INTRAVENOUS
  Administered 2020-11-11: 50 ug via INTRAVENOUS

## 2020-11-11 MED ORDER — ROCURONIUM BROMIDE 10 MG/ML (PF) SYRINGE
PREFILLED_SYRINGE | INTRAVENOUS | Status: DC | PRN
  Administered 2020-11-11: 50 mg via INTRAVENOUS
  Administered 2020-11-11: 20 mg via INTRAVENOUS

## 2020-11-11 MED ORDER — PROPOFOL 10 MG/ML IV BOLUS
INTRAVENOUS | Status: DC | PRN
  Administered 2020-11-11: 150 mg via INTRAVENOUS

## 2020-11-11 MED ORDER — OXYBUTYNIN CHLORIDE ER 5 MG PO TB24
5.0000 mg | ORAL_TABLET | Freq: Two times a day (BID) | ORAL | Status: DC
  Administered 2020-11-11: 5 mg via ORAL
  Filled 2020-11-11 (×3): qty 1

## 2020-11-11 MED ORDER — CILOSTAZOL 50 MG PO TABS
50.0000 mg | ORAL_TABLET | Freq: Two times a day (BID) | ORAL | Status: DC
  Administered 2020-11-11 (×2): 50 mg via ORAL
  Filled 2020-11-11 (×4): qty 1

## 2020-11-11 MED ORDER — METHOCARBAMOL 500 MG PO TABS
500.0000 mg | ORAL_TABLET | Freq: Four times a day (QID) | ORAL | Status: DC | PRN
  Administered 2020-11-11 – 2020-11-12 (×2): 500 mg via ORAL
  Filled 2020-11-11 (×2): qty 1

## 2020-11-11 MED ORDER — FENTANYL CITRATE (PF) 250 MCG/5ML IJ SOLN
INTRAMUSCULAR | Status: AC
  Filled 2020-11-11: qty 5

## 2020-11-11 MED ORDER — VANCOMYCIN HCL IN DEXTROSE 1-5 GM/200ML-% IV SOLN
1000.0000 mg | INTRAVENOUS | Status: AC
  Administered 2020-11-11: 1000 mg via INTRAVENOUS

## 2020-11-11 MED ORDER — ACETAMINOPHEN 650 MG RE SUPP
650.0000 mg | RECTAL | Status: DC | PRN
Start: 2020-11-11 — End: 2020-11-12

## 2020-11-11 MED ORDER — DIPHENHYDRAMINE HCL 50 MG/ML IJ SOLN
INTRAMUSCULAR | Status: DC | PRN
  Administered 2020-11-11: 12.5 mg via INTRAVENOUS

## 2020-11-11 MED ORDER — ORAL CARE MOUTH RINSE: 15.0000 mL | Freq: Once | OROMUCOSAL | Status: AC

## 2020-11-11 MED ORDER — FENTANYL CITRATE (PF) 100 MCG/2ML IJ SOLN
25.0000 ug | INTRAMUSCULAR | Status: DC | PRN
  Administered 2020-11-11 (×2): 50 ug via INTRAVENOUS
  Administered 2020-11-11 (×2): 25 ug via INTRAVENOUS

## 2020-11-11 MED ORDER — SODIUM CHLORIDE 0.9 % IV SOLN: INTRAVENOUS | Status: DC | PRN

## 2020-11-11 MED ORDER — KCL IN DEXTROSE-NACL 20-5-0.45 MEQ/L-%-% IV SOLN: INTRAVENOUS | Status: DC

## 2020-11-11 SURGICAL SUPPLY — 54 items
BAG DECANTER FOR FLEXI CONT (MISCELLANEOUS) ×2 IMPLANT
BAND INSRT 18 STRL LF DISP RB (MISCELLANEOUS) ×2
BAND RUBBER #18 3X1/16 STRL (MISCELLANEOUS) ×4 IMPLANT
CLEANER TIP ELECTROSURG 2X2 (MISCELLANEOUS) ×2 IMPLANT
CNTNR URN SCR LID CUP LEK RST (MISCELLANEOUS) ×1 IMPLANT
CONT SPEC 4OZ STRL OR WHT (MISCELLANEOUS) ×2
COVER WAND RF STERILE (DRAPES) ×2 IMPLANT
DRAPE LAPAROTOMY 100X72X124 (DRAPES) ×2 IMPLANT
DRAPE MICROSCOPE LEICA (MISCELLANEOUS) ×2 IMPLANT
DRAPE SHEET LG 3/4 BI-LAMINATE (DRAPES) ×2 IMPLANT
DRAPE SURG 17X11 SM STRL (DRAPES) ×2 IMPLANT
DRAPE UTILITY XL STRL (DRAPES) ×2 IMPLANT
DRSG AQUACEL AG ADV 3.5X 4 (GAUZE/BANDAGES/DRESSINGS) IMPLANT
DRSG AQUACEL AG ADV 3.5X 6 (GAUZE/BANDAGES/DRESSINGS) ×1 IMPLANT
DRSG TELFA 3X8 NADH (GAUZE/BANDAGES/DRESSINGS) IMPLANT
DURAPREP 26ML APPLICATOR (WOUND CARE) ×2 IMPLANT
DURASEAL SPINE SEALANT 3ML (MISCELLANEOUS) IMPLANT
ELECT BLADE 4.0 EZ CLEAN MEGAD (MISCELLANEOUS)
ELECT REM PT RETURN 9FT ADLT (ELECTROSURGICAL) ×2
ELECTRODE BLDE 4.0 EZ CLN MEGD (MISCELLANEOUS) IMPLANT
ELECTRODE REM PT RTRN 9FT ADLT (ELECTROSURGICAL) ×1 IMPLANT
GLOVE SURG SS PI 7.5 STRL IVOR (GLOVE) ×2 IMPLANT
GLOVE SURG SS PI 8.0 STRL IVOR (GLOVE) ×4 IMPLANT
GLOVE SURG UNDER POLY LF SZ6.5 (GLOVE) ×1 IMPLANT
GLOVE SURG UNDER POLY LF SZ7 (GLOVE) ×3 IMPLANT
GOWN STRL REUS W/ TWL LRG LVL3 (GOWN DISPOSABLE) ×1 IMPLANT
GOWN STRL REUS W/ TWL XL LVL3 (GOWN DISPOSABLE) ×1 IMPLANT
GOWN STRL REUS W/TWL LRG LVL3 (GOWN DISPOSABLE) ×2
GOWN STRL REUS W/TWL XL LVL3 (GOWN DISPOSABLE) ×2
IV CATH 14GX2 1/4 (CATHETERS) ×2 IMPLANT
KIT BASIN OR (CUSTOM PROCEDURE TRAY) ×2 IMPLANT
NDL SPNL 18GX3.5 QUINCKE PK (NEEDLE) ×2 IMPLANT
NEEDLE 22X1 1/2 (OR ONLY) (NEEDLE) ×2 IMPLANT
NEEDLE SPNL 18GX3.5 QUINCKE PK (NEEDLE) ×4 IMPLANT
PACK LAMINECTOMY NEURO (CUSTOM PROCEDURE TRAY) ×2 IMPLANT
PAD DRESSING TELFA 3X8 NADH (GAUZE/BANDAGES/DRESSINGS) IMPLANT
PATTIES SURGICAL .75X.75 (GAUZE/BANDAGES/DRESSINGS) ×2 IMPLANT
SPONGE LAP 4X18 RFD (DISPOSABLE) IMPLANT
SPONGE SURGIFOAM ABS GEL 100 (HEMOSTASIS) ×2 IMPLANT
STAPLER VISISTAT (STAPLE) ×1 IMPLANT
STRIP CLOSURE SKIN 1/2X4 (GAUZE/BANDAGES/DRESSINGS) ×2 IMPLANT
SUT NURALON 4 0 TR CR/8 (SUTURE) IMPLANT
SUT PROLENE 3 0 PS 2 (SUTURE) IMPLANT
SUT VIC AB 1 CT1 27 (SUTURE) ×4
SUT VIC AB 1 CT1 27XBRD ANTBC (SUTURE) IMPLANT
SUT VIC AB 1-0 CT2 27 (SUTURE) IMPLANT
SUT VIC AB 2-0 CT1 27 (SUTURE) ×2
SUT VIC AB 2-0 CT1 TAPERPNT 27 (SUTURE) IMPLANT
SUT VIC AB 2-0 CT2 27 (SUTURE) IMPLANT
SYR 3ML LL SCALE MARK (SYRINGE) ×2 IMPLANT
TOWEL GREEN STERILE (TOWEL DISPOSABLE) ×2 IMPLANT
TOWEL GREEN STERILE FF (TOWEL DISPOSABLE) ×2 IMPLANT
TRAY FOLEY MTR SLVR 16FR STAT (SET/KITS/TRAYS/PACK) ×2 IMPLANT
YANKAUER SUCT BULB TIP NO VENT (SUCTIONS) ×2 IMPLANT

## 2020-11-11 NOTE — Brief Op Note (Signed)
11/11/2020  10:10 AM  PATIENT:  Jeanella Cara  80 y.o. female  PRE-OPERATIVE DIAGNOSIS:  Stenosis Lumbar four-five synovial cyst Lumbar four-five right  POST-OPERATIVE DIAGNOSIS:  Stenosis Lumbar four-five synovial cyst Lumbar four-five right  PROCEDURE:  Procedure(s): Mircolumbar revision decompression Lumbar four-five bilaterally, excision of synovial cyst (N/A)  SURGEON:  Surgeon(s) and Role:    Susa Day, MD - Primary  PHYSICIAN ASSISTANT:   ASSISTANTS: Bissell   ANESTHESIA:   general  EBL:  75 mL   BLOOD ADMINISTERED:none  DRAINS: none   LOCAL MEDICATIONS USED:  MARCAINE     SPECIMEN:  No Specimen  DISPOSITION OF SPECIMEN:  N/A  COUNTS:  YES  TOURNIQUET:  * No tourniquets in log *  DICTATION: .Other Dictation: Dictation Number [id number]  PLAN OF CARE: Admit for overnight observation  PATIENT DISPOSITION:  PACU - hemodynamically stable.   Delay start of Pharmacological VTE agent (>24hrs) due to surgical blood loss or risk of bleeding: yes

## 2020-11-11 NOTE — Interval H&P Note (Signed)
History and Physical Interval Note:  11/11/2020 7:18 AM  Jodi Cline  has presented today for surgery, with the diagnosis of Stenosis L4-5 synovial cyst L4-5 right.  The various methods of treatment have been discussed with the patient and family. After consideration of risks, benefits and other options for treatment, the patient has consented to  Procedure(s) with comments: Mircolumbar decompression L4-5, excision of synovial cyst (N/A) - 2 hrs as a surgical intervention.  The patient's history has been reviewed, patient examined, no change in status, stable for surgery.  I have reviewed the patient's chart and labs.  Questions were answered to the patient's satisfaction.     Johnn Hai

## 2020-11-11 NOTE — Op Note (Signed)
NAMETATELYN, Jodi Cline MEDICAL RECORD NO: [id number] ACCOUNT NO: [id number] DATE OF BIRTH: 1940/09/14 FACILITY: [facility] LOCATION: [facility] PHYSICIAN: Johnn Hai, MD  Operative Report   DATE OF PROCEDURE: 11/11/2020  PREOPERATIVE DIAGNOSES: 1.  Recurrent spinal stenosis at L4-L5. 2.  Synovial cyst, L4-L5 facet on the right.  POSTOPERATIVE DIAGNOSES: 1.  Recurrent spinal stenosis at L4-L5. 2.  Synovial cyst, L4-L5 facet on the right.  PROCEDURE PERFORMED:   1.  Revision microlumbar decompression L4-L5 bilaterally with bilateral hemilaminotomies at L4 and L5. 2.  Foraminotomy L4-L5, right. 3.  Excision of synovial cyst, L4-L5, right.  ANESTHESIA:  General.  ASSISTANT:  Lacie Draft, PA  HISTORY:  An 80 year old female with history of lumbar decompression at L4-L5.  She has had recurrent stenosis at L4-L5 and on the right a synovial cyst, ligamentum flavum and facet hypertrophy impinging upon the L5 nerve root.  This has been refractory  to conservative treatment.  She was indicated for revision lumbar decompression, excision of synovial cyst.  Risks and benefits were discussed including bleeding, infection, damage to neurovascular structures, no change in symptoms, worsening symptoms,  DVT, PE, anesthetic complications, need for fusion in the future, etc.  DESCRIPTION OF PROCEDURE:  With the patient in supine position.  After induction of adequate general anesthesia, vancomycin and gentamicin she was placed prone on the Wilson frame.  Foley to gravity.  There was 600 mL of urine within her Foley.  Lumbar  region was prepped and draped in the usual sterile fashion.  Two 18-gauge spinal needle was utilized to localize 4-5 interspace, confirmed with x-ray.  Incision was made from the spinous process of 4 to 5.  Subcutaneous tissue was dissected.   Electrocautery was utilized to achieve hemostasis.  Dorsal lumbar fascia divided in line with the skin incision.  Paraspinous muscles was  elevated from the lamina 4-5 bilaterally.  McCulloch retractor was placed.  Operating microscope was draped and  drawn in the surgical field.  After confirmatory radiograph obtained with a Kocher on the spinous process of L4.  Scar tissue was noted bilaterally at L4-L5.  I used a curette to skeletonize the lamina of L4 and L5 bilaterally.  Next, I used a Leksell  rongeur to remove the spinous processes of 4 and 5 and then I used a straight curette to skeletonize the cephalad edge of L5 and the caudad edge of L4. Identifying the inferior articulating process of L4 and the superior articulating process of L5 on the  right.  Hemilaminotomy of the caudad edge of L4 and a cephalad edge of L5 were performed.  This was after I developed a plane between the thecal sac and the residual epidural fibrosis and ligamentum flavum.  I protected the 5 root on the right,  performed a foraminotomy of L5.  I identified the L5 root, gently mobilized it medially.  I then decompressed the lateral recess of the medial border of the pedicle.  Some residual ligamentum flavum noted that was removed from the interspace as well.   With the neural elements well protected I continued cephalad and undercut the superior articulating process of L5.  This extended cephalad to the tip of the superior articulating process.  There was a small synovial cyst that was noted extending from the  joint into the lateral recess.  This plane was used to develop a plane between the cyst and the lateral aspect of the thecal sac.  I excised the cyst.  I performed a foraminotomy of L4  as well.  Following this, a neural probe passed freely at the  foramen of L4 and L5.  There was no disk herniation.  Bipolar electrocautery was utilized to achieve hemostasis.  I then removed some residual ligamentum flavum from the left side between the lamina. Decompressing the lateral recess on the left as well.   There was good restoration of the thecal sac noted.  There  is epidural fat noted as well.  Copiously irrigated the wound.  No active bleeding.  A Woodson retractor passed freely out the foramen of L4 and L5.  Following the decompression, there was 1 cm  of excursion of the 5 root medial to the pedicle without tension.  Bone wax was placed on the cancellous surface.  Confirmatory radiograph obtained with [facility] retractors in the foramen of 4 and 5.  Felt we decompressed the L5 root satisfactorily.   There was severe lateral recess stenosis noted on the right.  We preserved the pars.  Copiously irrigated with antibiotic irrigation.  A Woodson probe passed freely out the foramen of L4 and L5 on the left as well.  Next, I placed 1 patty of  thrombin-soaked Gelfoam in the lateral recess on the right.  I removed the McCulloch retractor, irrigated the paraspinous musculature.  Electrocautery was utilized to achieve hemostasis.  No evidence of CSF leakage or active bleeding.  Fascia was then  reapproximated with #1 Vicryl in interrupted figure-of-eight sutures, subcutaneous with 2-0 and skin with staples.  A small aperture was left in the fascia to allow for any drainage.  Sterile dressing applied, placed supine on the hospital bed, extubated  without difficulty and transported to the recovery room in satisfactory condition.  The patient tolerated the procedure well.  COMPLICATIONS:  No complications.  BLOOD LOSS:  Minimal blood loss of 75 mL.   PUS D: 11/11/2020 10:17:44 am T: 11/11/2020 11:42:00 am  JOB: [id number]/ [id number]

## 2020-11-11 NOTE — Transfer of Care (Signed)
Immediate Anesthesia Transfer of Care Note  Patient: Jodi Cline  Procedure(s) Performed: Neta Ehlers revision decompression Lumbar four-five bilaterally, excision of synovial cyst (N/A )  Patient Location: PACU  Anesthesia Type:General  Level of Consciousness: awake, alert  and oriented  Airway & Oxygen Therapy: Patient Spontanous Breathing and Patient connected to nasal cannula oxygen  Post-op Assessment: Report given to RN, Post -op Vital signs reviewed and stable and Patient moving all extremities X 4  Post vital signs: Reviewed and stable  Last Vitals:  Vitals Value Taken Time  BP 137/84 11/11/20 0956  Temp    Pulse 84 11/11/20 0959  Resp 13 11/11/20 0959  SpO2 100 % 11/11/20 0959  Vitals shown include unvalidated device data.  Last Pain:  Vitals:   11/11/20 0657  TempSrc: Oral  PainSc: 2          Complications: No complications documented.

## 2020-11-11 NOTE — Progress Notes (Signed)
  Pharmacy Antibiotic Note  Jodi Cline is a 80 y.o. female admitted on 11/11/2020 with surgical prophylaxis.  Pharmacy has been consulted for vancomycin x1 post op dosing. No drain per RN.  Plan: Vancomycin 750mg  x1   Height: 5' 6.5" (168.9 cm) Weight: 72.1 kg (159 lb) IBW/kg (Calculated) : 60.45  Temp (24hrs), Avg:97.7 F (36.5 C), Min:97.2 F (36.2 C), Max:98.4 F (36.9 C)  Recent Labs  Lab 11/09/20 1230  WBC 7.2  CREATININE 0.74    Estimated Creatinine Clearance: 53.6 mL/min (by C-G formula based on SCr of 0.74 mg/dL).    Allergies  Allergen Reactions  . Tramadol Swelling    Lips swell  . Benazepril Swelling    Lip swelling  . Cephalexin Swelling    Lips Swelling  . Penicillins Swelling    Reaction: unknown    Thank you for allowing pharmacy to be a part of this patient's care.  Benetta Spar, PharmD, BCPS, BCCP Clinical Pharmacist  Please check AMION for all [place] phone numbers After 10:00 PM, call [place] [phone]

## 2020-11-11 NOTE — Anesthesia Procedure Notes (Signed)
Procedure Name: Intubation Date/Time: 11/11/2020 7:43 AM Performed by: Mariea Clonts, CRNA Pre-anesthesia Checklist: Patient identified, Emergency Drugs available, Suction available and Patient being monitored Patient Re-evaluated:Patient Re-evaluated prior to induction Oxygen Delivery Method: Circle System Utilized Preoxygenation: Pre-oxygenation with 100% oxygen Induction Type: IV induction Ventilation: Mask ventilation without difficulty Laryngoscope Size: Miller and 2 Grade View: Grade II Tube type: Oral Tube size: 7.0 mm Number of attempts: 1 Airway Equipment and Method: Stylet and Oral airway Placement Confirmation: ETT inserted through vocal cords under direct vision,  positive ETCO2 and breath sounds checked- equal and bilateral Tube secured with: Tape Dental Injury: Teeth and Oropharynx as per pre-operative assessment

## 2020-11-12 ENCOUNTER — Encounter (HOSPITAL_COMMUNITY): Payer: Self-pay | Admitting: Specialist

## 2020-11-12 DIAGNOSIS — Z888 Allergy status to other drugs, medicaments and biological substances status: Secondary | ICD-10-CM | POA: Diagnosis not present

## 2020-11-12 DIAGNOSIS — Z87891 Personal history of nicotine dependence: Secondary | ICD-10-CM | POA: Diagnosis not present

## 2020-11-12 DIAGNOSIS — Z881 Allergy status to other antibiotic agents status: Secondary | ICD-10-CM | POA: Diagnosis not present

## 2020-11-12 DIAGNOSIS — Z880 Allergy status to penicillin: Secondary | ICD-10-CM | POA: Diagnosis not present

## 2020-11-12 DIAGNOSIS — M4316 Spondylolisthesis, lumbar region: Secondary | ICD-10-CM | POA: Diagnosis not present

## 2020-11-12 DIAGNOSIS — M5416 Radiculopathy, lumbar region: Secondary | ICD-10-CM | POA: Diagnosis not present

## 2020-11-12 DIAGNOSIS — M7138 Other bursal cyst, other site: Secondary | ICD-10-CM | POA: Diagnosis not present

## 2020-11-12 DIAGNOSIS — Z885 Allergy status to narcotic agent status: Secondary | ICD-10-CM | POA: Diagnosis not present

## 2020-11-12 DIAGNOSIS — M48061 Spinal stenosis, lumbar region without neurogenic claudication: Secondary | ICD-10-CM | POA: Diagnosis not present

## 2020-11-12 LAB — BASIC METABOLIC PANEL
Anion gap: 7 (ref 5–15)
BUN: 9 mg/dL (ref 8–23)
CO2: 24 mmol/L (ref 22–32)
Calcium: 8.8 mg/dL — ABNORMAL LOW (ref 8.9–10.3)
Chloride: 103 mmol/L (ref 98–111)
Creatinine, Ser: 0.78 mg/dL (ref 0.44–1.00)
GFR, Estimated: >60 mL/min (ref >60)
Glucose, Bld: 148 mg/dL — ABNORMAL HIGH (ref 70–99)
Potassium: 3.2 mmol/L — ABNORMAL LOW (ref 3.5–5.1)
Sodium: 134 mmol/L — ABNORMAL LOW (ref 135–145)

## 2020-11-12 NOTE — Progress Notes (Signed)
Subjective: 1 Day Post-Op Procedure(s) (LRB): Mircolumbar revision decompression Lumbar four-five bilaterally, excision of synovial cyst (N/A) Patient reports pain as mild and moderate.   Reports incisional back pain. No leg pain. Voiding without difficulty. No BM yet but + flatus. Feels ready to go home today. No other c/o.  Objective: Vital signs in last 24 hours: Temp:  [97.2 F (36.2 C)-98.9 F (37.2 C)] 98.4 F (36.9 C) (03/18 0741) Pulse Rate:  [77-96] 80 (03/18 0741) Resp:  [14-23] 18 (03/18 0741) BP: (111-169)/(53-91) 118/66 (03/18 0741) SpO2:  [92 %-100 %] 97 % (03/18 0741)  Intake/Output from previous day: 03/17 0701 - 03/18 0700 In: 2720 [P.O.:720; I.V.:1200; IV Piggyback:800] Out: 6701 [Urine:1300; Blood:75] Intake/Output this shift: No intake/output data recorded.  Recent Labs    11/09/20 1230  HGB 13.2   Recent Labs    11/09/20 1230  WBC 7.2  RBC 4.31  HCT 40.1  PLT 120*   Recent Labs    11/09/20 1230 11/12/20 0256  NA 135 134*  K 3.5 3.2*  CL 99 103  CO2 27 24  BUN 10 9  CREATININE 0.74 0.78  GLUCOSE 102* 148*  CALCIUM 9.8 8.8*   No results for input(s): LABPT, INR in the last 72 hours.  Neurologically intact ABD soft Neurovascular intact Sensation intact distally Intact pulses distally Dorsiflexion/Plantar flexion intact Incision: dressing C/D/I and no drainage No cellulitis present Compartment soft no sign of DVT   Assessment/Plan: 1 Day Post-Op Procedure(s) (LRB): Mircolumbar revision decompression Lumbar four-five bilaterally, excision of synovial cyst (N/A) Advance diet Up with therapy D/C IV fluids  Discussed D/C instructions, dressing instructions, Lspine precautions Follow up outpt in 2 weeks D/C to home  Cecilie Kicks 11/12/2020, 8:14 AM

## 2020-11-12 NOTE — Discharge Instructions (Addendum)
Walk As Tolerated utilizing back precautions.  No bending, twisting, or lifting.  No driving for 2 weeks.   Aquacel dressing may remain in place until follow up. May shower with aquacel dressing in place. If the dressing peels off or becomes saturated, you may remove aquacel dressing and place gauze and tape dressing which should be kept clean and dry and changed daily. Do not remove steri-strips if they are present. See Dr. Tonita Cong in office in 10 to 14 days. Begin taking aspirin 81mg  per day starting 4 days after your surgery on Monday 11/15/20, if not allergic to aspirin or on another blood thinner. Walk daily even outside. Use a cane or walker only if necessary. Avoid sitting on soft sofas.

## 2020-11-12 NOTE — Progress Notes (Signed)
Patient is discharged form room 3C10 at this time. Alert and in stable condition. IV site d/c'd and instructions read to patient with understanding verbalized and all questions answered. Left unit via wheelchair with all belongings at side.

## 2020-11-12 NOTE — Evaluation (Signed)
Occupational Therapy Evaluation Patient Details Name: Jodi Cline MRN: [id number] DOB: 02-02-1941 Today's Date: 11/12/2020    History of Present Illness Skylinn is a 80 y/o female s/p L4-L5 decompression, excision of synovial cyst, and foraminotomy L4-L5 on the R on 3/17 to address spinal stenosis. PMH includes LBBB, basal cell carcinomas, squamous cell carcinomas, HTN, HLD, and prior microdiscemtomy in 2001.   Clinical Impression   PTA patinet independent and driving. Admitted for above and presenting with problem list below, including pain and precaution.  Patient educated on back precautions, ADL compensatory techniques, recommendations, DME and safety.  Patient completing ADLs with modified independence after education. Based on performance today, no further OT needs identified, all education completed and OT will sign off.  Thank you for this referral.      Follow Up Recommendations  No OT follow up    Equipment Recommendations  None recommended by OT    Recommendations for Other Services       Precautions / Restrictions Precautions Precautions: Back Precaution Booklet Issued: Yes (comment) Precaution Comments: no brace Restrictions Weight Bearing Restrictions: No      Mobility Bed Mobility Overal bed mobility: Independent             General bed mobility comments: OOB upon entry    Transfers Overall transfer level: Independent Equipment used: None                  Balance Overall balance assessment: Mild deficits observed, not formally tested                                         ADL either performed or assessed with clinical judgement   ADL Overall ADL's : Modified independent                                       General ADL Comments: patient demonstrates ability to complete ADLs and functional transfers/mobility in room without assist after education on compensatory techniques for back precaution adherence.      Vision         Perception     Praxis      Pertinent Vitals/Pain Pain Assessment: 0-10 Pain Score: 5  Pain Location: Low back Pain Descriptors / Indicators: Constant;Aching;Operative site guarding Pain Intervention(s): Limited activity within patient's tolerance;Monitored during session;Repositioned     Hand Dominance     Extremity/Trunk Assessment Upper Extremity Assessment Upper Extremity Assessment: Overall WFL for tasks assessed   Lower Extremity Assessment Lower Extremity Assessment: Defer to PT evaluation   Cervical / Trunk Assessment Cervical / Trunk Assessment: Normal   Communication Communication Communication: No difficulties   Cognition Arousal/Alertness: Awake/alert Behavior During Therapy: WFL for tasks assessed/performed Overall Cognitive Status: Within Functional Limits for tasks assessed                                     General Comments  pt voices understanding with education on back precautions and ADL/IADL compensatory techniques    Exercises     Shoulder Instructions      Home Living Family/patient expects to be discharged to:: Private residence Living Arrangements: Alone Available Help at Discharge: Family;Available PRN/intermittently Type of Home: House Home Access: Level entry  Home Layout: One level     Bathroom Shower/Tub: Occupational psychologist: Handicapped height     Home Equipment: Shower seat - built in;Grab bars - toilet;Grab bars - tub/shower          Prior Functioning/Environment Level of Independence: Independent        Comments: independent and driving        OT Problem List: Pain;Decreased knowledge of precautions;Decreased knowledge of use of DME or AE      OT Treatment/Interventions:      OT Goals(Current goals can be found in the care plan section) Acute Rehab OT Goals Patient Stated Goal: go home OT Goal Formulation: With patient  OT Frequency:     Barriers  to D/C:            Co-evaluation              AM-PAC OT "6 Clicks" Daily Activity     Outcome Measure Help from another person eating meals?: None Help from another person taking care of personal grooming?: None Help from another person toileting, which includes using toliet, bedpan, or urinal?: None Help from another person bathing (including washing, rinsing, drying)?: None Help from another person to put on and taking off regular upper body clothing?: None Help from another person to put on and taking off regular lower body clothing?: None 6 Click Score: 24   End of Session Nurse Communication: Mobility status  Activity Tolerance: Patient tolerated treatment well Patient left: with call bell/phone within reach  OT Visit Diagnosis: Other abnormalities of gait and mobility (R26.89);Pain Pain - part of body:  (back- incisional)                Time: 3810-1751 OT Time Calculation (min): 13 min Charges:  OT General Charges $OT Visit: 1 Visit OT Evaluation $OT Eval Low Complexity: 1 Low  Jolaine Artist, OT Acute Rehabilitation Services Pager [phone] Office [phone]   Delight Stare 11/12/2020, 10:21 AM

## 2020-11-12 NOTE — Discharge Summary (Signed)
Physician Discharge Summary   Patient ID: Jodi Cline MRN: [id number] DOB/AGE: 11/27/1940 80 y.o.  Admit date: 11/11/2020 Discharge date: 11/12/2020  Primary Diagnosis:   Stenosis Lumbar four-five synovial cyst Lumbar four-five right  Admission Diagnoses:  Past Medical History:  Diagnosis Date  . Anemia   . Anxiety   . Arthritis   . BCC (basal cell carcinoma) 04/18/2005   Right mid thigh (CX35FU)  . BCC (basal cell carcinoma) 04/07/2009   right shoulder blade (Cx3)  . Depression   . GERD (gastroesophageal reflux disease)   . Hyperlipidemia   . Hypertension   . Insomnia   . Left bundle branch block (LBBB) 08/2019  . Nodular basal cell carcinoma (BCC) 08/05/2013   right shoulder blade (EXC)  . PAD (peripheral artery disease) ([place])   . Pneumonia    as a child  . SCC (squamous cell carcinoma) 02/19/2001   left arm (CX35FU)  . SCC (squamous cell carcinoma) 04/23/2013   left shin (txpbx)  . SCC (squamous cell carcinoma) 08/05/2013   left shin (txpbx)  . SCC (squamous cell carcinoma) 05/07/2014   Left shin (txpbx)  . SCC (squamous cell carcinoma) 08/04/2014   left shin (MOHS)  . Superficial basal cell carcinoma (BCC) 07/13/2015   right thigh (tx p bx)   Discharge Diagnoses:   Active Problems:   Spinal stenosis at L4-L5 level  Procedure:  Procedure(s) (LRB): Mircolumbar revision decompression Lumbar four-five bilaterally, excision of synovial cyst (N/A)   Consults: None  HPI:  see H&P    Laboratory Data: Hospital Outpatient Visit on 11/09/2020  Component Date Value Ref Range Status  . WBC 11/09/2020 7.2  4.0 - 10.5 K/uL Final  . RBC 11/09/2020 4.31  3.87 - 5.11 MIL/uL Final  . Hemoglobin 11/09/2020 13.2  12.0 - 15.0 g/dL Final  . HCT 11/09/2020 40.1  36.0 - 46.0 % Final  . MCV 11/09/2020 93.0  80.0 - 100.0 fL Final  . MCH 11/09/2020 30.6  26.0 - 34.0 pg Final  . MCHC 11/09/2020 32.9  30.0 - 36.0 g/dL Final  . RDW 11/09/2020 13.0  11.5 - 15.5 % Final  .  Platelets 11/09/2020 120* 150 - 400 K/uL Final  . nRBC 11/09/2020 0.0  0.0 - 0.2 % Final   Performed at [facility], [place] [address] [place], [place] [address]  . Sodium 11/09/2020 135  135 - 145 mmol/L Final  . Potassium 11/09/2020 3.5  3.5 - 5.1 mmol/L Final  . Chloride 11/09/2020 99  98 - 111 mmol/L Final  . CO2 11/09/2020 27  22 - 32 mmol/L Final  . Glucose, Bld 11/09/2020 102* 70 - 99 mg/dL Final   Glucose reference range applies only to samples taken after fasting for at least 8 hours.  . BUN 11/09/2020 10  8 - 23 mg/dL Final  . Creatinine, Ser 11/09/2020 0.74  0.44 - 1.00 mg/dL Final  . Calcium 11/09/2020 9.8  8.9 - 10.3 mg/dL Final  . GFR, Estimated 11/09/2020 >60  >60 mL/min Final   Comment: (NOTE) Calculated using the CKD-EPI Creatinine Equation (2021)   . Anion gap 11/09/2020 9  5 - 15 Final   Performed at [facility] [address], [place], [place] [address]  . MRSA, PCR 11/09/2020 NEGATIVE  NEGATIVE Final  . Staphylococcus aureus 11/09/2020 NEGATIVE  NEGATIVE Final   Comment: (NOTE) The Xpert SA Assay (FDA approved for NASAL specimens in patients 82 years of age and older), is one component of  a comprehensive surveillance program. It is not intended to diagnose infection nor to guide or monitor treatment. Performed at [facility], [place] [address] [place], [place] [address]   . SARS Coronavirus 2 11/09/2020 NEGATIVE  NEGATIVE Final   Comment: (NOTE) SARS-CoV-2 target nucleic acids are NOT DETECTED.  The SARS-CoV-2 RNA is generally detectable in upper and lower respiratory specimens during the acute phase of infection. Negative results do not preclude SARS-CoV-2 infection, do not rule out co-infections with other pathogens, and should not be used as the sole basis for treatment or other patient management decisions. Negative results must be combined with clinical observations, patient history, and epidemiological information. The  expected result is Negative.  Fact Sheet for Patients: SugarRoll.be  Fact Sheet for Healthcare Providers: https://www.woods-mathews.com/  This test is not yet approved or cleared by the [place] FDA and  has been authorized for detection and/or diagnosis of SARS-CoV-2 by FDA under an Emergency Use Authorization (EUA). This EUA will remain  in effect (meaning this test can be used) for the duration of the COVID-19 declaration under Se                          ction 564(b)(1) of the Act, 21 U.S.C. section 360bbb-3(b)(1), unless the authorization is terminated or revoked sooner.  Performed at [facility], [place] [address] [place], [place] [address]    Recent Labs    11/09/20 1230  HGB 13.2   Recent Labs    11/09/20 1230  WBC 7.2  RBC 4.31  HCT 40.1  PLT 120*   Recent Labs    11/09/20 1230 11/12/20 0256  NA 135 134*  K 3.5 3.2*  CL 99 103  CO2 27 24  BUN 10 9  CREATININE 0.74 0.78  GLUCOSE 102* 148*  CALCIUM 9.8 8.8*   No results for input(s): LABPT, INR in the last 72 hours.  X-Rays:DG Lumbar Spine 2-3 Views  Result Date: 11/11/2020 CLINICAL DATA:  Localization for laminectomy EXAM: LUMBAR SPINE - 2-3 VIEW COMPARISON:  November 11, 2020 lumbar radiographs obtained earlier in the day FINDINGS: Cross-table lateral lumbar image labeled #1, time stamped 7:57:44 submitted. Metallic probe tips are posterior to the L4-5 and L5-S1 interspaces. No fracture or spondylolisthesis. There is aortic atherosclerosis. Cross-table lateral lumbar image labeled #2, time stamped 8:16: 22 submitted. Cutting tool overlies the L5 spinous process with probe tips posterior to L4-5 and L5-S1. No fracture or spondylolisthesis. Cross-table lateral lumbar image time stamped 9:22:54 submitted. Metallic probe tips are posterior to the inferior aspect of the L4 vertebral body in the inferior aspect of the L5 vertebral body respectively with cutting tool  overlying the L5 spinous process. No fracture or spondylolisthesis. There is aortic atherosclerosis. IMPRESSION: On final submitted cross-table lateral image, metallic probe tips are posterior to the inferior aspects of the L4 and L5 vertebral bodies respectively. Cutting tool overlies L5 spinous process. No fracture or spondylolisthesis. Aortic Atherosclerosis (ICD10-I70.0). Electronically Signed   By: Lowella Grip III M.D.   On: 11/11/2020 13:58   DG Lumbar Spine 2-3 Views  Result Date: 11/11/2020 CLINICAL DATA:  Preop for disc herniation EXAM: LUMBAR SPINE - 2-3 VIEW COMPARISON:  05/12/2020 MRI of the lumbar spine FINDINGS: Five lumbar type vertebral bodies as numbered on preceding MRI. The lowest open disc space is L5-S1. Disc narrowing and lower lumbar facet spurring as previously described. Slight L4-5 anterolisthesis. IMPRESSION: Five lumbar type vertebrae as numbered  on 2021 MRI. Electronically Signed   By: Monte Fantasia M.D.   On: 11/11/2020 07:02    EKG: Orders placed or performed in visit on 06/01/20  . EKG 12-Lead     Hospital Course: Patient was admitted to [facility] and taken to the OR and underwent the above state procedure without complications.  Patient tolerated the procedure well and was later transferred to the recovery room and then to the orthopaedic floor for postoperative care.  They were given PO and IV analgesics for pain control following their surgery.  They were given 24 hours of postoperative antibiotics.   PT was consulted postop to assist with mobility and transfers.  The patient was allowed to be WBAT with therapy and was taught back precautions. Discharge planning was consulted to help with postop disposition and equipment needs.  Patient had a good night on the evening of surgery and started to get up OOB with therapy on day one. Patient was seen in rounds and was ready to go home on day one.  They were given discharge instructions and dressing  directions.  They were instructed on when to follow up in the office with Dr. Tonita Cong.   Diet: Regular diet Activity:WBAT, Lspine precautions Follow-up:in 10-14 days Disposition - Home Discharged Condition: good   Discharge Instructions    Call MD / Call 911   Complete by: As directed    If you experience chest pain or shortness of breath, CALL 911 and be transported to the hospital emergency room.  If you develope a fever above 101 F, pus (white drainage) or increased drainage or redness at the wound, or calf pain, call your surgeon's office.   Constipation Prevention   Complete by: As directed    Drink plenty of fluids.  Prune juice may be helpful.  You may use a stool softener, such as Colace (over the counter) 100 mg twice a day.  Use MiraLax (over the counter) for constipation as needed.   Diet - low sodium heart healthy   Complete by: As directed    Increase activity slowly as tolerated   Complete by: As directed      Allergies as of 11/12/2020      Reactions   Tramadol Swelling   Lips swell   Benazepril Swelling   Lip swelling   Cephalexin Swelling   Lips Swelling   Penicillins Swelling   Reaction: unknown      Medication List    STOP taking these medications   methocarbamol 500 MG tablet Commonly known as: ROBAXIN   rosuvastatin 20 MG tablet Commonly known as: CRESTOR     TAKE these medications   acetaminophen-codeine 300-30 MG tablet Commonly known as: TYLENOL #3 Take 1 tablet by mouth daily as needed for moderate pain.   amLODipine 5 MG tablet Commonly known as: NORVASC Take 2 tablets (10 mg total) by mouth daily.   calcium carbonate 500 MG chewable tablet Commonly known as: TUMS - dosed in mg elemental calcium Chew 500 mg by mouth daily as needed for indigestion.   cilostazol 50 MG tablet Commonly known as: PLETAL Take 1 tablet (50 mg total) by mouth 2 (two) times daily.   docusate sodium 100 MG capsule Commonly known as: Colace Take 1 capsule  (100 mg total) by mouth 2 (two) times daily as needed for mild constipation.   famotidine 10 MG tablet Commonly known as: PEPCID Take 10 mg by mouth daily as needed for heartburn or indigestion.  oxybutynin 5 MG 24 hr tablet Commonly known as: DITROPAN-XL Take 5 mg by mouth 2 (two) times daily.   oxyCODONE-acetaminophen 5-325 MG tablet Commonly known as: Percocet Take 1-2 tablets by mouth every 4 (four) hours as needed for severe pain.   polyethylene glycol 17 g packet Commonly known as: MIRALAX / GLYCOLAX Take 17 g by mouth daily.   PROBIOTIC-10 PO Take 1 tablet by mouth 3 (three) times a week.   tretinoin 0.05 % cream Commonly known as: RETIN-A APPLY TO AFFECTED AREA EXTERNALLY AT BEDTIME What changed: See the new instructions.   valsartan-hydrochlorothiazide 160-12.5 MG tablet Commonly known as: DIOVAN-HCT Take 1 tablet by mouth daily.   zolpidem 10 MG tablet Commonly known as: AMBIEN Take 10 mg by mouth at bedtime.       Follow-up Information    Susa Day, MD Follow up in 2 week(s).   Specialty: Orthopedic Surgery Contact information: [address] [place] [address] [phone]               Signed: Lacie Draft, PA-C Orthopaedic Surgery 11/12/2020, 8:16 AM

## 2020-11-12 NOTE — Anesthesia Postprocedure Evaluation (Signed)
Anesthesia Post Note  Patient: Jodi Cline  Procedure(s) Performed: Neta Ehlers revision decompression Lumbar four-five bilaterally, excision of synovial cyst (N/A )     Patient location during evaluation: PACU Anesthesia Type: General Level of consciousness: awake and alert Pain management: pain level controlled Vital Signs Assessment: post-procedure vital signs reviewed and stable Respiratory status: spontaneous breathing, nonlabored ventilation, respiratory function stable and patient connected to nasal cannula oxygen Cardiovascular status: blood pressure returned to baseline and stable Postop Assessment: no apparent nausea or vomiting Anesthetic complications: no   No complications documented.  Last Vitals:  Vitals:   11/12/20 0412 11/12/20 0741  BP: 120/61 118/66  Pulse: 77 80  Resp: 18 18  Temp: 36.8 C 36.9 C  SpO2: 95% 97%    Last Pain:  Vitals:   11/12/20 0741  TempSrc: Oral  PainSc:                  [name]

## 2020-11-12 NOTE — Evaluation (Signed)
Physical Therapy Evaluation Patient Details Name: Jodi Cline MRN: [id number] DOB: 04-Sep-1940 Today's Date: 11/12/2020   History of Present Illness  Jodi Cline is a 80 y/o female s/p L4-L5 decompression, excision of synovial cyst, and foraminotomy L4-L5 on the R on 3/17 to address spinal stenosis. PMH includes LBBB, basal cell carcinomas, squamous cell carcinomas, HTN, HLD, and prior microdiscemtomy in 2001.    Clinical Impression  Pt received walking around room, cooperative and pleasant. Reviewed back precautions and provided handout. Overall mod I to I for mobility. No physical assist needed. Demonstrated ability to mobilize while maintaining precautions. Pt would benefit from PT to address LE strength and endurance. Recommend OP PT. Pt left sitting on EOB with all needs met and call bell within reach. Will continue to monitor acutely.    Follow Up Recommendations Outpatient PT    Equipment Recommendations  None recommended by PT    Recommendations for Other Services       Precautions / Restrictions Precautions Precautions: Back Precaution Booklet Issued: Yes (comment) Precaution Comments: no brace Restrictions Weight Bearing Restrictions: No      Mobility  Bed Mobility Overal bed mobility: Independent                  Transfers Overall transfer level: Independent Equipment used: None                Ambulation/Gait Ambulation/Gait assistance: Modified independent (Device/Increase time) Gait Distance (Feet): 160 Feet Assistive device: None   Gait velocity: decreased   General Gait Details: steady without AD, slower over inclined areas  Stairs            Wheelchair Mobility    Modified Rankin (Stroke Patients Only)       Balance Overall balance assessment: Modified Independent                                           Pertinent Vitals/Pain Pain Assessment: 0-10 Pain Score: 5  Pain Location: Low back Pain Descriptors /  Indicators: Constant;Aching Pain Intervention(s): Monitored during session    Home Living Family/patient expects to be discharged to:: Private residence Living Arrangements: Alone Available Help at Discharge: Family;Available PRN/intermittently Type of Home: House Home Access: Level entry     Home Layout: One level Home Equipment: None      Prior Function Level of Independence: Independent         Comments: Has 2 sons that live in the area     Hand Dominance        Extremity/Trunk Assessment   Upper Extremity Assessment Upper Extremity Assessment: Defer to OT evaluation    Lower Extremity Assessment Lower Extremity Assessment: Overall WFL for tasks assessed    Cervical / Trunk Assessment Cervical / Trunk Assessment: Normal  Communication   Communication: No difficulties  Cognition Arousal/Alertness: Awake/alert Behavior During Therapy: WFL for tasks assessed/performed Overall Cognitive Status: Within Functional Limits for tasks assessed                                        General Comments      Exercises     Assessment/Plan    PT Assessment Patient needs continued PT services  PT Problem List Decreased strength;Decreased activity tolerance;Decreased balance;Decreased knowledge of precautions;Decreased mobility;Decreased coordination;Pain  PT Treatment Interventions Balance training;Gait training;Neuromuscular re-education;Stair training;Functional mobility training;Patient/family education;Therapeutic activities;Therapeutic exercise    PT Goals (Current goals can be found in the Care Plan section)  Acute Rehab PT Goals Patient Stated Goal: go home PT Goal Formulation: With patient Time For Goal Achievement: 12/03/20 Potential to Achieve Goals: Good    Frequency Min 5X/week   Barriers to discharge        Co-evaluation               AM-PAC PT "6 Clicks" Mobility  Outcome Measure Help needed turning from your  back to your side while in a flat bed without using bedrails?: None Help needed moving from lying on your back to sitting on the side of a flat bed without using bedrails?: None Help needed moving to and from a bed to a chair (including a wheelchair)?: None Help needed standing up from a chair using your arms (e.g., wheelchair or bedside chair)?: None Help needed to walk in hospital room?: None Help needed climbing 3-5 steps with a railing? : A Little 6 Click Score: 23    End of Session   Activity Tolerance: Patient tolerated treatment well Patient left: in bed;with call bell/phone within reach Nurse Communication: Mobility status PT Visit Diagnosis: Unsteadiness on feet (R26.81);Pain;Muscle weakness (generalized) (M62.81)    Time:  -      Charges:         Rosita Kea, SPT

## 2020-11-30 ENCOUNTER — Ambulatory Visit: Payer: Medicare HMO | Admitting: Internal Medicine

## 2020-11-30 ENCOUNTER — Encounter: Payer: Self-pay | Admitting: Internal Medicine

## 2020-11-30 ENCOUNTER — Other Ambulatory Visit: Payer: Self-pay

## 2020-11-30 VITALS — BP 130/60 | HR 79 | Ht 66.5 in | Wt 158.6 lb

## 2020-11-30 DIAGNOSIS — I739 Peripheral vascular disease, unspecified: Secondary | ICD-10-CM | POA: Diagnosis not present

## 2020-11-30 DIAGNOSIS — I447 Left bundle-branch block, unspecified: Secondary | ICD-10-CM | POA: Diagnosis not present

## 2020-11-30 DIAGNOSIS — E785 Hyperlipidemia, unspecified: Secondary | ICD-10-CM

## 2020-11-30 DIAGNOSIS — I10 Essential (primary) hypertension: Secondary | ICD-10-CM | POA: Diagnosis not present

## 2020-11-30 NOTE — Progress Notes (Signed)
Cardiology Office Note:    Date:  11/30/2020   ID:  Jodi Cline, DOB 02/24/1941, MRN [id number]  PCP:  Lavone Orn, MD  Cardiologist:  Elouise Munroe, MD  Electrophysiologist:  None   Referring MD: Lavone Orn, MD   Chief Complaint/Reason for Referral: PAD, LBBB, HLD  History of Present Illness:    Jodi Cline is a 80 y.o. female with a history of hyperlipidemia, hypertension, squamous cell carcinomas and basal cell carcinomas of the skin, who presents today for follow up of PAD, and hyperlipidemia.    Doing well overall. The patient denies chest pain, chest pressure, dyspnea at rest or with exertion, palpitations, PND, orthopnea, or leg swelling. Denies cough, fever, chills. Denies nausea, vomiting. Denies syncope or presyncope. Denies dizziness or lightheadedness.    Past Medical History:  Diagnosis Date   Anemia    Anxiety    Arthritis    BCC (basal cell carcinoma) 04/18/2005   Right mid thigh (CX35FU)   BCC (basal cell carcinoma) 04/07/2009   right shoulder blade (Cx3)   Depression    GERD (gastroesophageal reflux disease)    Hyperlipidemia    Hypertension    Insomnia    Left bundle branch block (LBBB) 08/2019   Nodular basal cell carcinoma (BCC) 08/05/2013   right shoulder blade (EXC)   PAD (peripheral artery disease) ([place])    Pneumonia    as a child   SCC (squamous cell carcinoma) 02/19/2001   left arm (CX35FU)   SCC (squamous cell carcinoma) 04/23/2013   left shin (txpbx)   SCC (squamous cell carcinoma) 08/05/2013   left shin (txpbx)   SCC (squamous cell carcinoma) 05/07/2014   Left shin (txpbx)   SCC (squamous cell carcinoma) 08/04/2014   left shin (MOHS)   Superficial basal cell carcinoma (BCC) 07/13/2015   right thigh (tx p bx)    Past Surgical History:  Procedure Laterality Date   CESAREAN SECTION     COLONOSCOPY     LUMBAR LAMINECTOMY/DECOMPRESSION MICRODISCECTOMY N/A 11/11/2020   Procedure: Mircolumbar revision decompression Lumbar  four-five bilaterally, excision of synovial cyst;  Surgeon: Susa Day, MD;  Location: [facility];  Service: Orthopedics;  Laterality: N/A;   THORACIC SPINE SURGERY     Dr. Tonita Cong   TUBAL LIGATION      Current Medications: Current Meds  Medication Sig   acetaminophen-codeine (TYLENOL #3) 300-30 MG tablet Take 1 tablet by mouth daily as needed for moderate pain.   amLODipine (NORVASC) 5 MG tablet Take 2 tablets (10 mg total) by mouth daily.   calcium carbonate (TUMS - DOSED IN MG ELEMENTAL CALCIUM) 500 MG chewable tablet Chew 500 mg by mouth daily as needed for indigestion.   cilostazol (PLETAL) 50 MG tablet Take 1 tablet (50 mg total) by mouth 2 (two) times daily.   docusate sodium (COLACE) 100 MG capsule Take 1 capsule (100 mg total) by mouth 2 (two) times daily as needed for mild constipation.   famotidine (PEPCID) 10 MG tablet Take 10 mg by mouth daily as needed for heartburn or indigestion.   oxybutynin (DITROPAN-XL) 5 MG 24 hr tablet Take 5 mg by mouth 2 (two) times daily.   polyethylene glycol (MIRALAX / GLYCOLAX) 17 g packet Take 17 g by mouth daily.   Probiotic Product (PROBIOTIC-10 PO) Take 1 tablet by mouth 3 (three) times a week.   rosuvastatin (CRESTOR) 20 MG tablet 1 tablet   tretinoin (RETIN-A) 0.05 % cream APPLY TO AFFECTED AREA EXTERNALLY AT BEDTIME (Patient  taking differently: Apply 1 application topically daily as needed (Face).)   valsartan-hydrochlorothiazide (DIOVAN-HCT) 160-12.5 MG tablet Take 1 tablet by mouth daily.   zolpidem (AMBIEN) 10 MG tablet Take 10 mg by mouth at bedtime.   [DISCONTINUED] oxyCODONE-acetaminophen (PERCOCET) 5-325 MG tablet Take 1-2 tablets by mouth every 4 (four) hours as needed for severe pain.     Allergies:   Tramadol, Benazepril, Cephalexin, and Penicillins   Social History   Tobacco Use   Smoking status: Former Smoker   Smokeless tobacco: Never Used   Tobacco comment: Quit at age 1 years  Vaping Use   Vaping Use: Never used   Substance Use Topics   Alcohol use: Yes    Alcohol/week: 14.0 standard drinks    Types: 14 Glasses of wine per week    Comment: 2 glasses of wine a day   Drug use: Never     Family History: The patient's family history is not on file.  ROS:   Please see the history of present illness.    All other systems reviewed and are negative.  EKGs/Labs/Other Studies Reviewed:    The following studies were reviewed today:  EKG:  NSR, LBBB  Recent Labs: 11/09/2020: Hemoglobin 13.2; Platelets 120 11/12/2020: BUN 9; Creatinine, Ser 0.78; Potassium 3.2; Sodium 134  Recent Lipid Panel    Component Value Date/Time   CHOL 228 (H) 09/30/2019 0950   TRIG 105 09/30/2019 0950   HDL 95 09/30/2019 0950   CHOLHDL 2.4 09/30/2019 0950   LDLCALC 115 (H) 09/30/2019 0950    Physical Exam:    VS:  BP 130/60   Pulse 79   Ht 5' 6.5" (1.689 m)   Wt 158 lb 9.6 oz (71.9 kg)   BMI 25.22 kg/m     Wt Readings from Last 5 Encounters:  11/30/20 158 lb 9.6 oz (71.9 kg)  11/11/20 159 lb (72.1 kg)  11/09/20 159 lb 3.2 oz (72.2 kg)  08/31/20 152 lb 9.6 oz (69.2 kg)  08/12/20 160 lb (72.6 kg)    Constitutional: No acute distress Eyes: sclera non-icteric, normal conjunctiva and lids ENMT: normal dentition, moist mucous membranes Cardiovascular: regular rhythm, normal rate, no murmurs. S1 and S2 normal. Radial pulses normal bilaterally. No jugular venous distention.  Respiratory: clear to auscultation bilaterally GI : normal bowel sounds, soft and nontender. No distention.   MSK: extremities warm, well perfused. No edema.  NEURO: grossly nonfocal exam, moves all extremities. PSYCH: alert and oriented x 3, normal mood and affect.   ASSESSMENT:    1. Essential hypertension   2. PAD (peripheral artery disease) ([place])   3. LBBB (left bundle branch block)   4. Hyperlipidemia LDL goal <70    PLAN:    PAD (peripheral artery disease) ([facility]) -Stable. Continue aspirin 81 mg daily, Crestor 20 mg daily.    Hyperlipidemia, unspecified hyperlipidemia type-continue Crestor 20 mg daily.  Lipids can be repeated at her next primary care appointment.    Essential hypertension-continue valsartan HCTZ 160-12.5 mg, amlodipine 10 mg daily.    LBBB (left bundle branch block)-no abnormalities on echocardiogram.  No chest pain.  Total time of encounter: 25 minutes total time of encounter, including 20 minutes spent in face-to-face patient care on the date of this encounter. This time includes coordination of care and counseling regarding above mentioned problem list. Remainder of non-face-to-face time involved reviewing chart documents/testing relevant to the patient encounter and documentation in the medical record. I have independently reviewed documentation from referring provider.  Cherlynn Kaiser, MD, [facility]    Medication Adjustments/Labs and Tests Ordered: Current medicines are reviewed at length with the patient today.  Concerns regarding medicines are outlined above.   Orders Placed This Encounter  Procedures   EKG 12-Lead   No orders of the defined types were placed in this encounter.   Patient Instructions  Medication Instructions:  No Changes In Medications at this time.  *If you need a refill on your cardiac medications before your next appointment, please call your pharmacy*  Follow-Up: At [facility], you and your health needs are our priority.  As part of our continuing mission to provide you with exceptional heart care, we have created designated Provider Care Teams.  These Care Teams include your primary Cardiologist (physician) and Advanced Practice Providers (APPs -  Physician Assistants and Nurse Practitioners) who all work together to provide you with the care you need, when you need it.  Your next appointment:   1 year(s)  The format for your next appointment:   In Person  Provider:   Cherlynn Kaiser, MD

## 2020-11-30 NOTE — Patient Instructions (Signed)

## 2020-12-14 ENCOUNTER — Other Ambulatory Visit: Payer: Self-pay | Admitting: Physician Assistant

## 2020-12-23 ENCOUNTER — Ambulatory Visit: Payer: Medicare HMO | Admitting: Physician Assistant

## 2020-12-23 ENCOUNTER — Other Ambulatory Visit: Payer: Self-pay

## 2020-12-23 DIAGNOSIS — D1800 Hemangioma unspecified site: Secondary | ICD-10-CM

## 2020-12-23 DIAGNOSIS — L814 Other melanin hyperpigmentation: Secondary | ICD-10-CM

## 2020-12-23 DIAGNOSIS — L578 Other skin changes due to chronic exposure to nonionizing radiation: Secondary | ICD-10-CM

## 2020-12-23 DIAGNOSIS — L821 Other seborrheic keratosis: Secondary | ICD-10-CM

## 2020-12-23 DIAGNOSIS — D485 Neoplasm of uncertain behavior of skin: Secondary | ICD-10-CM

## 2020-12-23 DIAGNOSIS — Z1283 Encounter for screening for malignant neoplasm of skin: Secondary | ICD-10-CM

## 2020-12-23 DIAGNOSIS — C44722 Squamous cell carcinoma of skin of right lower limb, including hip: Secondary | ICD-10-CM

## 2020-12-23 NOTE — Patient Instructions (Signed)

## 2020-12-29 ENCOUNTER — Other Ambulatory Visit: Payer: Self-pay | Admitting: Internal Medicine

## 2020-12-29 DIAGNOSIS — I10 Essential (primary) hypertension: Secondary | ICD-10-CM | POA: Diagnosis not present

## 2020-12-29 DIAGNOSIS — R911 Solitary pulmonary nodule: Secondary | ICD-10-CM

## 2020-12-29 DIAGNOSIS — I739 Peripheral vascular disease, unspecified: Secondary | ICD-10-CM | POA: Diagnosis not present

## 2020-12-29 DIAGNOSIS — I700 Atherosclerosis of aorta: Secondary | ICD-10-CM | POA: Diagnosis not present

## 2020-12-30 ENCOUNTER — Encounter: Payer: Self-pay | Admitting: Physician Assistant

## 2020-12-30 NOTE — Progress Notes (Signed)
   Follow-Up Visit   Subjective  Jodi Cline is a 80 y.o. female who presents for the following: Skin Problem (Patient has a lesion on right outer thigh. X about 4 weeks. Came up like a pimple and patient did try to pop it but nothing happened. It is painful. ).   The following portions of the chart were reviewed this encounter and updated as appropriate:  Tobacco  Allergies  Meds  Problems  Med Hx  Surg Hx  Fam Hx      Objective  Well appearing patient in no apparent distress; mood and affect are within normal limits.  A full examination was performed including scalp, head, eyes, ears, nose, lips, neck, chest, axillae, abdomen, back, buttocks, bilateral upper extremities, bilateral lower extremities, hands, feet, fingers, toes, fingernails, and toenails. All findings within normal limits unless otherwise noted below.  Objective  Right Thigh - Anterior: Hyperkeratotic scale with pink base       Assessment & Plan  SCC (squamous cell carcinoma), leg, right Right Thigh - Anterior  Skin / nail biopsy Type of biopsy: tangential   Informed consent: discussed and consent obtained   Timeout: patient name, date of birth, surgical site, and procedure verified   Procedure prep:  Patient was prepped and draped in usual sterile fashion (Non sterile) Prep type:  Chlorhexidine Anesthesia: the lesion was anesthetized in a standard fashion   Anesthetic:  1% lidocaine w/ epinephrine 1-100,000 local infiltration Instrument used: flexible razor blade   Outcome: patient tolerated procedure well   Post-procedure details: wound care instructions given    Destruction of lesion Complexity: simple   Destruction method: electrodesiccation and curettage   Informed consent: discussed and consent obtained   Timeout:  patient name, date of birth, surgical site, and procedure verified Anesthesia: the lesion was anesthetized in a standard fashion   Anesthetic:  1% lidocaine w/ epinephrine  1-100,000 local infiltration Curettage performed in three different directions: Yes   Curettage cycles:  1 Margin per side (cm):  0.1 Final wound size (cm):  1.2 Hemostasis achieved with:  aluminum chloride Outcome: patient tolerated procedure well with no complications   Post-procedure details: wound care instructions given    Specimen 1 - Surgical pathology Differential Diagnosis: bcc vs scc- txpbx  Check Margins: No  Lentigines - Scattered tan macules - Discussed due to sun exposure - Benign, observe - Call for any changes  Seborrheic Keratoses - Stuck-on, waxy, tan-brown papules and plaques  - Discussed benign etiology and prognosis. - Observe - Call for any changes   Hemangiomas - Red papules - Discussed benign nature - Observe - Call for any changes  Actinic Damage - diffuse scaly erythematous macules with underlying dyspigmentation - Recommend daily broad spectrum sunscreen SPF 30+ to sun-exposed areas, reapply every 2 hours as needed.  - Call for new or changing lesions.  Skin cancer screening performed today.   I, Anayelli Lai, PA-C, have reviewed all documentation's for this visit.  The documentation on 12/30/20 for the exam, diagnosis, procedures and orders are all accurate and complete.

## 2020-12-30 NOTE — Progress Notes (Signed)
Scc treated with biopsy. RTC prn

## 2021-01-10 DIAGNOSIS — E7800 Pure hypercholesterolemia, unspecified: Secondary | ICD-10-CM | POA: Diagnosis not present

## 2021-01-10 DIAGNOSIS — J439 Emphysema, unspecified: Secondary | ICD-10-CM | POA: Diagnosis not present

## 2021-01-10 DIAGNOSIS — I10 Essential (primary) hypertension: Secondary | ICD-10-CM | POA: Diagnosis not present

## 2021-01-10 DIAGNOSIS — M47816 Spondylosis without myelopathy or radiculopathy, lumbar region: Secondary | ICD-10-CM | POA: Diagnosis not present

## 2021-01-10 DIAGNOSIS — G4700 Insomnia, unspecified: Secondary | ICD-10-CM | POA: Diagnosis not present

## 2021-01-13 ENCOUNTER — Other Ambulatory Visit: Payer: Self-pay

## 2021-01-13 ENCOUNTER — Ambulatory Visit
Admission: RE | Admit: 2021-01-13 | Discharge: 2021-01-13 | Disposition: A | Discharge status: Home / Self Care | Payer: Medicare HMO | Source: Ambulatory Visit | Attending: Internal Medicine | Admitting: Internal Medicine

## 2021-01-13 DIAGNOSIS — R911 Solitary pulmonary nodule: Secondary | ICD-10-CM

## 2021-01-13 DIAGNOSIS — K449 Diaphragmatic hernia without obstruction or gangrene: Secondary | ICD-10-CM | POA: Diagnosis not present

## 2021-01-13 DIAGNOSIS — I2510 Atherosclerotic heart disease of native coronary artery without angina pectoris: Secondary | ICD-10-CM | POA: Diagnosis not present

## 2021-01-13 DIAGNOSIS — J984 Other disorders of lung: Secondary | ICD-10-CM | POA: Diagnosis not present

## 2021-01-13 DIAGNOSIS — J439 Emphysema, unspecified: Secondary | ICD-10-CM | POA: Diagnosis not present

## 2021-01-25 DIAGNOSIS — H01115 Allergic dermatitis of left lower eyelid: Secondary | ICD-10-CM | POA: Diagnosis not present

## 2021-01-25 DIAGNOSIS — H01114 Allergic dermatitis of left upper eyelid: Secondary | ICD-10-CM | POA: Diagnosis not present

## 2021-01-25 DIAGNOSIS — H01111 Allergic dermatitis of right upper eyelid: Secondary | ICD-10-CM | POA: Diagnosis not present

## 2021-01-25 DIAGNOSIS — H01112 Allergic dermatitis of right lower eyelid: Secondary | ICD-10-CM | POA: Diagnosis not present

## 2021-02-03 ENCOUNTER — Encounter (HOSPITAL_COMMUNITY): Payer: Self-pay | Admitting: Emergency Medicine

## 2021-02-03 ENCOUNTER — Observation Stay (HOSPITAL_COMMUNITY)
Admission: EM | Admit: 2021-02-03 | Discharge: 2021-02-04 | Disposition: A | Discharge status: Home / Self Care | Payer: Medicare HMO | Attending: Internal Medicine | Admitting: Internal Medicine

## 2021-02-03 ENCOUNTER — Emergency Department (HOSPITAL_COMMUNITY): Payer: Medicare HMO

## 2021-02-03 ENCOUNTER — Other Ambulatory Visit: Payer: Self-pay

## 2021-02-03 DIAGNOSIS — R55 Syncope and collapse: Secondary | ICD-10-CM | POA: Diagnosis not present

## 2021-02-03 DIAGNOSIS — R32 Unspecified urinary incontinence: Secondary | ICD-10-CM | POA: Diagnosis not present

## 2021-02-03 DIAGNOSIS — Z79899 Other long term (current) drug therapy: Secondary | ICD-10-CM | POA: Insufficient documentation

## 2021-02-03 DIAGNOSIS — Z87891 Personal history of nicotine dependence: Secondary | ICD-10-CM | POA: Insufficient documentation

## 2021-02-03 DIAGNOSIS — I4891 Unspecified atrial fibrillation: Secondary | ICD-10-CM | POA: Diagnosis present

## 2021-02-03 DIAGNOSIS — Z85828 Personal history of other malignant neoplasm of skin: Secondary | ICD-10-CM | POA: Diagnosis not present

## 2021-02-03 DIAGNOSIS — I10 Essential (primary) hypertension: Secondary | ICD-10-CM | POA: Diagnosis present

## 2021-02-03 DIAGNOSIS — E876 Hypokalemia: Secondary | ICD-10-CM | POA: Diagnosis not present

## 2021-02-03 DIAGNOSIS — R531 Weakness: Secondary | ICD-10-CM | POA: Diagnosis not present

## 2021-02-03 DIAGNOSIS — R61 Generalized hyperhidrosis: Secondary | ICD-10-CM | POA: Diagnosis not present

## 2021-02-03 DIAGNOSIS — I959 Hypotension, unspecified: Secondary | ICD-10-CM | POA: Diagnosis not present

## 2021-02-03 DIAGNOSIS — R4020 Unspecified coma: Secondary | ICD-10-CM | POA: Diagnosis not present

## 2021-02-03 DIAGNOSIS — N3281 Overactive bladder: Secondary | ICD-10-CM | POA: Diagnosis present

## 2021-02-03 DIAGNOSIS — Z85038 Personal history of other malignant neoplasm of large intestine: Secondary | ICD-10-CM | POA: Diagnosis not present

## 2021-02-03 DIAGNOSIS — A419 Sepsis, unspecified organism: Secondary | ICD-10-CM | POA: Diagnosis present

## 2021-02-03 DIAGNOSIS — N3000 Acute cystitis without hematuria: Secondary | ICD-10-CM | POA: Diagnosis not present

## 2021-02-03 DIAGNOSIS — Z20822 Contact with and (suspected) exposure to covid-19: Secondary | ICD-10-CM | POA: Diagnosis not present

## 2021-02-03 DIAGNOSIS — R110 Nausea: Secondary | ICD-10-CM | POA: Diagnosis not present

## 2021-02-03 DIAGNOSIS — R404 Transient alteration of awareness: Secondary | ICD-10-CM | POA: Diagnosis not present

## 2021-02-03 LAB — COMPREHENSIVE METABOLIC PANEL
ALT: 18 U/L (ref 0–44)
AST: 19 U/L (ref 15–41)
Albumin: 3.8 g/dL (ref 3.5–5.0)
Alkaline Phosphatase: 55 U/L (ref 38–126)
Anion gap: 8 (ref 5–15)
BUN: 11 mg/dL (ref 8–23)
CO2: 24 mmol/L (ref 22–32)
Calcium: 8.8 mg/dL — ABNORMAL LOW (ref 8.9–10.3)
Chloride: 103 mmol/L (ref 98–111)
Creatinine, Ser: 0.84 mg/dL (ref 0.44–1.00)
GFR, Estimated: >60 mL/min (ref >60)
Glucose, Bld: 126 mg/dL — ABNORMAL HIGH (ref 70–99)
Potassium: 3.2 mmol/L — ABNORMAL LOW (ref 3.5–5.1)
Sodium: 135 mmol/L (ref 135–145)
Total Bilirubin: 0.6 mg/dL (ref 0.3–1.2)
Total Protein: 6.9 g/dL (ref 6.5–8.1)

## 2021-02-03 LAB — BRAIN NATRIURETIC PEPTIDE: B Natriuretic Peptide: 56.2 pg/mL (ref 0.0–100.0)

## 2021-02-03 LAB — URINALYSIS, ROUTINE W REFLEX MICROSCOPIC
Bilirubin Urine: NEGATIVE
Glucose, UA: NEGATIVE mg/dL
Ketones, ur: 5 mg/dL — AB
Nitrite: POSITIVE — AB
Protein, ur: NEGATIVE mg/dL
Specific Gravity, Urine: 1.006 (ref 1.005–1.030)
WBC, UA: >50 WBC/hpf — ABNORMAL HIGH (ref 0–5)
pH: 6 (ref 5.0–8.0)

## 2021-02-03 LAB — CBC WITH DIFFERENTIAL/PLATELET
Abs Immature Granulocytes: 0.07 10*3/uL (ref 0.00–0.07)
Basophils Absolute: 0 10*3/uL (ref 0.0–0.1)
Basophils Relative: 0 %
Eosinophils Absolute: 0 10*3/uL (ref 0.0–0.5)
Eosinophils Relative: 0 %
HCT: 35.9 % — ABNORMAL LOW (ref 36.0–46.0)
Hemoglobin: 11.9 g/dL — ABNORMAL LOW (ref 12.0–15.0)
Immature Granulocytes: 1 %
Lymphocytes Relative: 9 %
Lymphs Abs: 1.4 10*3/uL (ref 0.7–4.0)
MCH: 31.2 pg (ref 26.0–34.0)
MCHC: 33.1 g/dL (ref 30.0–36.0)
MCV: 94.2 fL (ref 80.0–100.0)
Monocytes Absolute: 1 10*3/uL (ref 0.1–1.0)
Monocytes Relative: 7 %
Neutro Abs: 12.8 10*3/uL — ABNORMAL HIGH (ref 1.7–7.7)
Neutrophils Relative %: 83 %
Platelets: 186 10*3/uL (ref 150–400)
RBC: 3.81 MIL/uL — ABNORMAL LOW (ref 3.87–5.11)
RDW: 13.5 % (ref 11.5–15.5)
WBC: 15.4 10*3/uL — ABNORMAL HIGH (ref 4.0–10.5)
nRBC: 0 % (ref 0.0–0.2)

## 2021-02-03 LAB — RESP PANEL BY RT-PCR (FLU A&B, COVID) ARPGX2
Influenza A by PCR: NEGATIVE
Influenza B by PCR: NEGATIVE
SARS Coronavirus 2 by RT PCR: NEGATIVE

## 2021-02-03 LAB — PROTIME-INR
INR: 0.9 (ref 0.8–1.2)
Prothrombin Time: 12.5 seconds (ref 11.4–15.2)

## 2021-02-03 LAB — PHOSPHORUS: Phosphorus: 3.5 mg/dL (ref 2.5–4.6)

## 2021-02-03 LAB — MAGNESIUM: Magnesium: 2.1 mg/dL (ref 1.7–2.4)

## 2021-02-03 LAB — LACTIC ACID, PLASMA: Lactic Acid, Venous: 1.4 mmol/L (ref 0.5–1.9)

## 2021-02-03 LAB — TROPONIN I (HIGH SENSITIVITY)
Troponin I (High Sensitivity): 3 ng/L (ref <18)
Troponin I (High Sensitivity): 3 ng/L (ref <18)

## 2021-02-03 LAB — TSH: TSH: 6.639 u[IU]/mL — ABNORMAL HIGH (ref 0.350–4.500)

## 2021-02-03 MED ORDER — ACETAMINOPHEN 325 MG PO TABS: 650.0000 mg | ORAL_TABLET | Freq: Four times a day (QID) | ORAL | Status: DC | PRN

## 2021-02-03 MED ORDER — POTASSIUM CHLORIDE 10 MEQ/100ML IV SOLN
10.0000 meq | Freq: Once | INTRAVENOUS | Status: AC
  Administered 2021-02-03: 10 meq via INTRAVENOUS
  Filled 2021-02-03: qty 100

## 2021-02-03 MED ORDER — METOPROLOL TARTRATE 5 MG/5ML IV SOLN
5.0000 mg | Freq: Once | INTRAVENOUS | Status: AC
  Administered 2021-02-03: 5 mg via INTRAVENOUS
  Filled 2021-02-03: qty 5

## 2021-02-03 MED ORDER — ROSUVASTATIN CALCIUM 20 MG PO TABS
20.0000 mg | ORAL_TABLET | Freq: Every day | ORAL | Status: DC
  Administered 2021-02-04: 20 mg via ORAL
  Filled 2021-02-03: qty 1

## 2021-02-03 MED ORDER — LACTATED RINGERS IV BOLUS
500.0000 mL | Freq: Once | INTRAVENOUS | Status: AC
  Administered 2021-02-03: 500 mL via INTRAVENOUS

## 2021-02-03 MED ORDER — PANTOPRAZOLE SODIUM 40 MG IV SOLR
40.0000 mg | Freq: Once | INTRAVENOUS | Status: AC
  Administered 2021-02-03: 40 mg via INTRAVENOUS
  Filled 2021-02-03: qty 40

## 2021-02-03 MED ORDER — SODIUM CHLORIDE 0.9 % IV SOLN
1.0000 g | Freq: Once | INTRAVENOUS | Status: AC
  Administered 2021-02-03: 1 g via INTRAVENOUS
  Filled 2021-02-03: qty 1

## 2021-02-03 MED ORDER — MELATONIN 3 MG PO TABS
3.0000 mg | ORAL_TABLET | Freq: Every day | ORAL | Status: DC
  Administered 2021-02-04: 3 mg via ORAL
  Filled 2021-02-03: qty 1

## 2021-02-03 MED ORDER — SODIUM CHLORIDE 0.9 % IV SOLN
1.0000 g | Freq: Three times a day (TID) | INTRAVENOUS | Status: DC
  Administered 2021-02-04 (×2): 1 g via INTRAVENOUS
  Filled 2021-02-03 (×3): qty 1

## 2021-02-03 MED ORDER — ACETAMINOPHEN 650 MG RE SUPP: 650.0000 mg | Freq: Four times a day (QID) | RECTAL | Status: DC | PRN

## 2021-02-03 MED ORDER — DILTIAZEM HCL-DEXTROSE 125-5 MG/125ML-% IV SOLN (PREMIX)
5.0000 mg/h | INTRAVENOUS | Status: DC
Start: 2021-02-03 — End: 2021-02-04
  Administered 2021-02-03: 5 mg/h via INTRAVENOUS
  Filled 2021-02-03: qty 125

## 2021-02-03 MED ORDER — LACTATED RINGERS IV SOLN: INTRAVENOUS | Status: DC

## 2021-02-03 MED ORDER — CILOSTAZOL 100 MG PO TABS
50.0000 mg | ORAL_TABLET | Freq: Two times a day (BID) | ORAL | Status: DC
  Administered 2021-02-04 (×2): 50 mg via ORAL
  Filled 2021-02-03 (×3): qty 1

## 2021-02-03 MED ORDER — MELATONIN 3 MG PO TABS: 3.0000 mg | ORAL_TABLET | Freq: Every evening | ORAL | Status: DC | PRN

## 2021-02-03 MED ORDER — POTASSIUM CHLORIDE CRYS ER 20 MEQ PO TBCR
40.0000 meq | EXTENDED_RELEASE_TABLET | Freq: Once | ORAL | Status: AC
  Administered 2021-02-03: 40 meq via ORAL
  Filled 2021-02-03: qty 2

## 2021-02-03 MED ORDER — ASPIRIN 81 MG PO CHEW
324.0000 mg | CHEWABLE_TABLET | Freq: Once | ORAL | Status: AC
  Administered 2021-02-03: 324 mg via ORAL
  Filled 2021-02-03: qty 4

## 2021-02-03 MED ORDER — DILTIAZEM LOAD VIA INFUSION
10.0000 mg | Freq: Once | INTRAVENOUS | Status: AC
  Administered 2021-02-03: 10 mg via INTRAVENOUS
  Filled 2021-02-03: qty 10

## 2021-02-03 NOTE — ED Provider Notes (Signed)
[facility] DEPT Provider Note   CSN: [id number] Arrival date & time: 02/03/21  1421     History Chief Complaint  Patient presents with   Loss of Consciousness    Jodi Cline is a 80 y.o. female.  HPI Patient reports that she felt well this morning.  She ate a normal breakfast.  She went to give blood this morning.  She did not feel lightheaded or dizzy afterwards.  She took a snack.  Once she got home, she went outside to do some watering in the lawn.  She reports he went back into the house and sat down at the kitchen table.  Very quickly, she reports she started to feel "really bad".  She denies she had chest pain or shortness of breath but just generally felt like she might pass out.  She did not perceive palpitations or racing heart.  She reports she did feel kind of nauseated but denies she got cold and sweaty.  She reports she actually did pass out but did not fall the ground.  She reports she knows this because her friend who is coming over found her slumped over the table and called 911.  Reportedly she did not have any documented loss of pulses.  Upon EMS arrival, patient was revived without any interventions.  Reportedly she had a regular heart rate and regular respirations.  Patient reports that she feels much better now.  She does not perceive any palpitations.  She reports she feels a little bit like she is having some of her "heartburn".  She reports she might typically get some heartburn at night when she lies down and take some Pepcid.  After urinalysis returned, patient did endorse about 2 days of dysuria.  She reports she had about 2 days ago and then it seemed to have gotten somewhat better.  She denies back pain or fever.    Past Medical History:  Diagnosis Date   Anemia    Anxiety    Arthritis    BCC (basal cell carcinoma) 04/18/2005   Right mid thigh (CX35FU)   BCC (basal cell carcinoma) 04/07/2009   right shoulder blade (Cx3)    Depression    GERD (gastroesophageal reflux disease)    Hyperlipidemia    Hypertension    Insomnia    Left bundle branch block (LBBB) 08/2019   Nodular basal cell carcinoma (BCC) 08/05/2013   right shoulder blade (EXC)   PAD (peripheral artery disease) ([facility])    Pneumonia    as a child   SCC (squamous cell carcinoma) 02/19/2001   left arm (CX35FU)   SCC (squamous cell carcinoma) 04/23/2013   left shin (txpbx)   SCC (squamous cell carcinoma) 08/05/2013   left shin (txpbx)   SCC (squamous cell carcinoma) 05/07/2014   Left shin (txpbx)   SCC (squamous cell carcinoma) 08/04/2014   left shin (MOHS)   Superficial basal cell carcinoma (BCC) 07/13/2015   right thigh (tx p bx)    Patient Active Problem List   Diagnosis Date Noted   Spinal stenosis at L4-L5 level 11/11/2020   Synovial cyst of lumbar facet joint 06/03/2020   Greater trochanteric pain syndrome of left lower extremity 10/31/2017   Retrocalcaneal bursitis (back of heel), left 10/31/2017   Benign essential HTN 09/27/2017   Benign neoplasm of colon 09/27/2017   Cannot sleep 09/27/2017   Constipation by outlet dysfunction 09/27/2017   Hematuria syndrome 09/27/2017   HLD (hyperlipidemia) 09/27/2017   Radiculitis 09/27/2017  Past Surgical History:  Procedure Laterality Date   CESAREAN SECTION     COLONOSCOPY     LUMBAR LAMINECTOMY/DECOMPRESSION MICRODISCECTOMY N/A 11/11/2020   Procedure: Mircolumbar revision decompression Lumbar four-five bilaterally, excision of synovial cyst;  Surgeon: Susa Day, MD;  Location: [facility];  Service: Orthopedics;  Laterality: N/A;   THORACIC SPINE SURGERY     Dr. Tonita Cong   TUBAL LIGATION       OB History   No obstetric history on file.     History reviewed. No pertinent family history.  Social History   Tobacco Use   Smoking status: Former    Pack years: 0.00   Smokeless tobacco: Never   Tobacco comments:    Quit at age 68 years  Vaping Use   Vaping Use: Never used   Substance Use Topics   Alcohol use: Yes    Alcohol/week: 14.0 standard drinks    Types: 14 Glasses of wine per week    Comment: 2 glasses of wine a day   Drug use: Never    Home Medications Prior to Admission medications   Medication Sig Start Date End Date Taking? Authorizing Provider  acetaminophen-codeine (TYLENOL #3) 300-30 MG tablet Take 1 tablet by mouth daily as needed for moderate pain. 08/19/20   Magnant, Charles L, PA-C  amLODipine (NORVASC) 5 MG tablet Take 2 tablets (10 mg total) by mouth daily. 08/31/20   Elouise Munroe, MD  calcium carbonate (TUMS - DOSED IN MG ELEMENTAL CALCIUM) 500 MG chewable tablet Chew 500 mg by mouth daily as needed for indigestion. 07/09/17   [provider]  cilostazol (PLETAL) 50 MG tablet Take 1 tablet (50 mg total) by mouth 2 (two) times daily. 03/02/20   Wellington Hampshire, MD  docusate sodium (COLACE) 100 MG capsule Take 1 capsule (100 mg total) by mouth 2 (two) times daily as needed for mild constipation. 11/11/20   Susa Day, MD  famotidine (PEPCID) 10 MG tablet Take 10 mg by mouth daily as needed for heartburn or indigestion.    [provider]  oxybutynin (DITROPAN-XL) 5 MG 24 hr tablet Take 5 mg by mouth 2 (two) times daily.    [provider]  polyethylene glycol (MIRALAX / GLYCOLAX) 17 g packet Take 17 g by mouth daily. 11/11/20   Susa Day, MD  Probiotic Product (PROBIOTIC-10 PO) Take 1 tablet by mouth 3 (three) times a week.    [provider]  rosuvastatin (CRESTOR) 20 MG tablet 1 tablet    [provider]  tretinoin (RETIN-A) 0.05 % cream Apply to affected area externally at bedtime 12/15/20   Sheffield, Vida Roller R, PA-C  valsartan-hydrochlorothiazide (DIOVAN-HCT) 160-12.5 MG tablet Take 1 tablet by mouth daily.    [provider]  zolpidem (AMBIEN) 10 MG tablet Take 10 mg by mouth at bedtime. 06/25/17   [provider]    Allergies    Tramadol, Benazepril, Cephalexin,  and Penicillins  Review of Systems   Review of Systems 10 systems reviewed and negative except as per HPI Physical Exam Updated Vital Signs BP 113/68   Pulse 72   Temp 97.8 F (36.6 C) (Oral)   Resp 18   Ht 5\' 6"  (1.676 m)   Wt 72.6 kg   SpO2 96%   BMI 25.82 kg/m   Physical Exam Constitutional:      Appearance: Normal appearance.     Comments: Well-nourished well-developed.  Excellent physical condition for age.  No respiratory distress.  HENT:  Head: Normocephalic and atraumatic.     Mouth/Throat:     Pharynx: Oropharynx is clear.  Eyes:     Extraocular Movements: Extraocular movements intact.  Cardiovascular:     Rate and Rhythm: Tachycardia present. Rhythm irregular.  Pulmonary:     Effort: Pulmonary effort is normal.     Breath sounds: Normal breath sounds.  Abdominal:     General: There is no distension.     Palpations: Abdomen is soft.     Tenderness: There is no abdominal tenderness. There is no guarding.  Musculoskeletal:        General: No swelling or tenderness. Normal range of motion.     Right lower leg: No edema.     Left lower leg: No edema.  Skin:    General: Skin is warm and dry.  Neurological:     General: No focal deficit present.     Mental Status: She is alert and oriented to person, place, and time.     Coordination: Coordination normal.  Psychiatric:        Mood and Affect: Mood normal.    ED Results / Procedures / Treatments   Labs (all labs ordered are listed, but only abnormal results are displayed) Labs Reviewed  COMPREHENSIVE METABOLIC PANEL - Abnormal; Notable for the following components:      Result Value   Potassium 3.2 (*)    Glucose, Bld 126 (*)    Calcium 8.8 (*)    All other components within normal limits  CBC WITH DIFFERENTIAL/PLATELET - Abnormal; Notable for the following components:   WBC 15.4 (*)    RBC 3.81 (*)    Hemoglobin 11.9 (*)    HCT 35.9 (*)    Neutro Abs 12.8 (*)    All other components within  normal limits  URINALYSIS, ROUTINE W REFLEX MICROSCOPIC - Abnormal; Notable for the following components:   APPearance HAZY (*)    Hgb urine dipstick SMALL (*)    Ketones, ur 5 (*)    Nitrite POSITIVE (*)    Leukocytes,Ua LARGE (*)    WBC, UA >50 (*)    Bacteria, UA RARE (*)    All other components within normal limits  TSH - Abnormal; Notable for the following components:   TSH 6.639 (*)    All other components within normal limits  URINE CULTURE  CULTURE, BLOOD (ROUTINE X 2)  CULTURE, BLOOD (ROUTINE X 2)  BRAIN NATRIURETIC PEPTIDE  PROTIME-INR  MAGNESIUM  PHOSPHORUS  LACTIC ACID, PLASMA  LACTIC ACID, PLASMA  TROPONIN I (HIGH SENSITIVITY)  TROPONIN I (HIGH SENSITIVITY)    EKG EKG Interpretation  Date/Time:  Thursday February 03 2021 15:20:36 EDT Ventricular Rate:  133 PR Interval:    QRS Duration: 135 QT Interval:  324 QTC Calculation: 482 R Axis:   86 Text Interpretation: Atrial fibrillation LVH with secondary repolarization abnormality Anterior infarct, old ST depression, consider ischemia, diffuse lds agree. Confirmed by Charlesetta Shanks [phone]) on 02/03/2021 6:35:07 PM  Radiology DG Chest Port 1 View  Result Date: 02/03/2021 CLINICAL DATA:  Weakness with blurred vision. EXAM: PORTABLE CHEST 1 VIEW COMPARISON:  May 24, 2018 FINDINGS: The heart size and mediastinal contours are within normal limits. Both lungs are clear. A chronic seventh left rib fracture is seen. IMPRESSION: No active cardiopulmonary disease. Electronically Signed   By: Virgina Norfolk M.D.   On: 02/03/2021 15:47    Procedures Procedures  CRITICAL CARE Performed by: Charlesetta Shanks   Total critical care time: 23  minutes  Critical care time was exclusive of separately billable procedures and treating other patients.  Critical care was necessary to treat or prevent imminent or life-threatening deterioration.  Critical care was time spent personally by me on the following activities:  development of treatment plan with patient and/or surrogate as well as nursing, discussions with consultants, evaluation of patient's response to treatment, examination of patient, obtaining history from patient or surrogate, ordering and performing treatments and interventions, ordering and review of laboratory studies, ordering and review of radiographic studies, pulse oximetry and re-evaluation of patient's condition.  Medications Ordered in ED Medications  lactated ringers infusion ( Intravenous New Bag/Given 02/03/21 1530)  potassium chloride 10 mEq in 100 mL IVPB (10 mEq Intravenous New Bag/Given 02/03/21 1822)  diltiazem (CARDIZEM) 1 mg/mL load via infusion 10 mg (10 mg Intravenous Bolus from Bag 02/03/21 1757)    And  diltiazem (CARDIZEM) 125 mg in dextrose 5% 125 mL (1 mg/mL) infusion (5 mg/hr Intravenous New Bag/Given 02/03/21 1805)  aztreonam (AZACTAM) 1 g in sodium chloride 0.9 % 100 mL IVPB (has no administration in time range)  pantoprazole (PROTONIX) injection 40 mg (40 mg Intravenous Given 02/03/21 1532)  aspirin chewable tablet 324 mg (324 mg Oral Given 02/03/21 1533)  metoprolol tartrate (LOPRESSOR) injection 5 mg (5 mg Intravenous Given 02/03/21 1551)  potassium chloride SA (KLOR-CON) CR tablet 40 mEq (40 mEq Oral Given 02/03/21 1817)  lactated ringers bolus 500 mL (0 mLs Intravenous Stopped 02/03/21 1827)    ED Course  I have reviewed the triage vital signs and the nursing notes.  Pertinent labs & imaging results that were available during my care of the patient were reviewed by me and considered in my medical decision making (see chart for details).  Cardiology consult placed for [facility] however clerk paged out for [facility] cardiovascular.  Call was returned by [facility] but plan for call to [facility].  Requested call be repaged to [facility], call was returned by PA-C for the group who indicated call should go to Dr. Johnsie Cancel and provided pager number.  I paged at 62: [phone] for Dr. Johnsie Cancel at [phone].  I  subsequently sent patient a message through amion at 18:07.  Awaiting callback.    MDM Rules/Calculators/A&P                         This patients CHA2DS2-VASc Score and unadjusted Ischemic Stroke Rate (% per year) is equal to 4.8 % stroke rate/year from a score of 4  Above score calculated as 1 point each if present [CHF, HTN, DM, Vascular=MI/PAD/Aortic Plaque, Age if 65-74, or Female] Above score calculated as 2 points each if present [Age > 75, or Stroke/TIA/TE]   Patient presents as outlined.  On physical exam, patient has atrial fibrillation rapid ventricular response.  Monitor shows rates ranging from 110s to 130s regularly irregular.  Patient does not perceive palpitations or racing heart.  She does not have any known history of atrial fibrillation.  Patient endorses a mild sensation of heartburn.  She perceives this to be a typical reflux symptoms.  At this time blood pressures are normotensive, patient has no dyspnea is not showing signs of acute respiratory failure.  We will need to proceed with cardiac evaluation.  EKG, troponins, chest x-ray for new onset A. fib and rule out ACS.  Troponins are not elevated.  Patient was given a 5 mg dose of Lopressor.  Heart rate decreased to low 100s but  remained up to 120s.  Cardizem bolus and drip initiated.  I have been trying to consult cardiology without success at this time.  Patient has normal GFR.  Will start Eliquis with CHA2DS2-VASc of 4.  Urinalysis is grossly positive for UTI.  Patient does now endorse symptoms for about 2 days.  Blood pressures are stable.  At this time does not appear acutely septic.  Will admit to hospitalist for treatment with IV antibiotics and rate control with Cardizem.  At this time have not had consult with cardiology will continue to try to contact. Final Clinical Impression(s) / ED Diagnoses Final diagnoses:  Syncope and collapse  Atrial fibrillation with rapid ventricular response (HCC)  Acute cystitis without  hematuria    Rx / DC Orders ED Discharge Orders     None        Charlesetta Shanks, MD 02/03/21 [phone]

## 2021-02-03 NOTE — ED Notes (Signed)
Message sent to Dr Johnney Killian for cardiac monitor showing A Fib heart rate [id number]

## 2021-02-03 NOTE — H&P (Signed)
History and Physical    PLEASE NOTE THAT DRAGON DICTATION SOFTWARE WAS USED IN THE CONSTRUCTION OF THIS NOTE.   Jodi Cline:[id number] DOB: September 20, 1940 DOA: 02/03/2021  PCP: Lavone Orn, MD Patient coming from: home   I have personally briefly reviewed patient's old medical records in [place]  Chief Complaint: Loss of consciousness  HPI: Jodi Cline is a 80 y.o. female with medical history significant for hypertension, hyperlipidemia, peripheral artery disease, who is admitted to [facility] on 02/03/2021 with new onset atrial fibrillation with RVR after presenting from home to [facility] ED for evaluation of loss of consciousness.  The patient reports that she woke the morning of 02/03/2021 at her baseline level of health, before donating blood later in the morning.  Following donation of blood, she reports that she remained asymptomatic, and upon returning home is able to water her garden in asymptomatic fashion.  Following this time, she went inside her house, and sat in a chair in her family room.  Shortly thereafter, she notes sensation that she was going to pass out, noting dizziness/lightheadedness, before ultimately losing consciousness.  A visiting friend confirmed loss of consciousness lasting only a few seconds, in the absence of any associated tonic-clonic activity, tongue biting, or loss of bowel/bladder function.  Patient's friend subsequently contacted EMS who suddenly brought patient to [facility] long emergency department for further evaluation of the above.   The patient denies any associated, preceding, or ensuing chest pain, shortness of breath palpitations, diaphoresis.  Denies any recent nausea, vomiting, diarrhea, abdominal pain, or rash.  Denies any recent trauma or travel.  No associated peripheral edema, calf tenderness, or lower extremity erythema.  No recent periods of prolonged diminished ambulatory activity and no recent surgical procedures.  She confirms no  known prior history of atrial fibrillation, and reports that she is on no blood thinners as an outpatient, including no aspirin.  She does however report 2 days of dysuria in the absence of gross hematuria.  Denies any associated subjective fever, chills, rigors, or generalized myalgias.  Denies any recent headache, neck stiffness, rhinitis, rhinorrhea, wheezing, cough.  No recent known COVID-19 exposures.  She also denies any associated acute focal weakness, acute paresthesias, numbness, vertigo, dysphagia, acute change in vision, dysarthria, or facial droop.     ED Course:  Vital signs in the ED were notable for the following: Temperature max 97.8; initial heart rates noted to be in the 140s in the setting of atrial fibrillation with RVR; ensuing heart rates improved into the 80s while still in atrial fibrillation following administration of multiple AV nodal blocking agents, including diltiazem drip, as further described below; blood pressure 105/70 -136/71; respiratory rate 16-24; oxygen saturation 95 to 98% on room air.  Labs were notable for the following: CMP was notable for the following: Sodium 135, potassium 3.2, bicarbonate 24, BUN 11, creatinine 0.84, glucose 126, and liver enzymes were found to be within normal limits.  Serum magnesium level 2.1.  BNP 56.  High-sensitivity troponin I x2 were found to be nonelevated at 3.  CBC notable for white blood cell count of 15,400, hemoglobin 11.9.  INR 0.9.  Urinalysis was associated with a ACE specimen and demonstrated greater than 50 white blood cells, large leukocyte Estrace, and nitrate positive.  Presenting EKG, by way of comparison to most recent prior from 11/30/2020 showed atrial fibrillation with RVR in ventricular rate 133, T wave inversion in leads II, V5, V6, and less than 1 mm  ST depression in leads II, and aVF, all of which appear new relative to most recent prior EKG.  Chest x-ray showed no evidence of acute cardiopulmonary process.   Nasopharyngeal COVID-19/influenza PCR were checked in the ED this evening and found to be negative.  Blood cultures x2 as well as urine culture were collected prior to initiation of antibiotics.  The EDP consulted the on-call cardiologist, Dr. Johnsie Cancel, with additional recommendations to follow.  While in the ED, the following were administered: Full dose aspirin p.o. x1, Lopressor 5 mg IV x1, diltiazem 10 mg IV x1 followed by initiation of diltiazem drip, potassium chloride 40 mill equivalents p.o. x1, lactated ringer bolus x500 cc with subsequent transition to continuous LR at 125 cc/h.  And potassium chloride 10 mill equivalents IV over 1 hour x 1 dose.  Additionally, in the setting of suspected urinary tract infection and reported allergies to penicillin and cephalosporins, including lip swelling to cephalosporins, the patient received a dose of aztreonam in the ED.  Socially, the patient was admitted for further evaluation management of new diagnosis of atrial fibrillation with RVR sepsis due to UTI following presenting syncope.    Review of Systems: As per HPI otherwise 10 point review of systems negative.   Past Medical History:  Diagnosis Date   Anemia    Anxiety    Arthritis    BCC (basal cell carcinoma) 04/18/2005   Right mid thigh (CX35FU)   BCC (basal cell carcinoma) 04/07/2009   right shoulder blade (Cx3)   Depression    GERD (gastroesophageal reflux disease)    Hyperlipidemia    Hypertension    Insomnia    Left bundle branch block (LBBB) 08/2019   Nodular basal cell carcinoma (BCC) 08/05/2013   right shoulder blade (EXC)   PAD (peripheral artery disease) ([place])    Pneumonia    as a child   SCC (squamous cell carcinoma) 02/19/2001   left arm (CX35FU)   SCC (squamous cell carcinoma) 04/23/2013   left shin (txpbx)   SCC (squamous cell carcinoma) 08/05/2013   left shin (txpbx)   SCC (squamous cell carcinoma) 05/07/2014   Left shin (txpbx)   SCC (squamous cell carcinoma)  08/04/2014   left shin (MOHS)   Superficial basal cell carcinoma (BCC) 07/13/2015   right thigh (tx p bx)    Past Surgical History:  Procedure Laterality Date   CESAREAN SECTION     COLONOSCOPY     LUMBAR LAMINECTOMY/DECOMPRESSION MICRODISCECTOMY N/A 11/11/2020   Procedure: Mircolumbar revision decompression Lumbar four-five bilaterally, excision of synovial cyst;  Surgeon: Susa Day, MD;  Location: [place];  Service: Orthopedics;  Laterality: N/A;   THORACIC SPINE SURGERY     Dr. Tonita Cong   TUBAL LIGATION      Social History:  reports that she has quit smoking. She has never used smokeless tobacco. She reports current alcohol use of about 14.0 standard drinks of alcohol per week. She reports that she does not use drugs.   Allergies  Allergen Reactions   Tramadol Swelling    Lips swell   Benazepril Swelling    Lip swelling   Cephalexin Swelling    Lips Swelling   Penicillins Swelling    Reaction: unknown    History reviewed. No pertinent family history.  Family history reviewed and not pertinent    Prior to Admission medications   Medication Sig Start Date End Date Taking? Authorizing Provider  acetaminophen-codeine (TYLENOL #3) 300-30 MG tablet Take 1 tablet by mouth daily as  needed for moderate pain. 08/19/20   Magnant, Charles L, PA-C  amLODipine (NORVASC) 5 MG tablet Take 2 tablets (10 mg total) by mouth daily. 08/31/20   Elouise Munroe, MD  calcium carbonate (TUMS - DOSED IN MG ELEMENTAL CALCIUM) 500 MG chewable tablet Chew 500 mg by mouth daily as needed for indigestion. 07/09/17   [provider]  cilostazol (PLETAL) 50 MG tablet Take 1 tablet (50 mg total) by mouth 2 (two) times daily. 03/02/20   Wellington Hampshire, MD  docusate sodium (COLACE) 100 MG capsule Take 1 capsule (100 mg total) by mouth 2 (two) times daily as needed for mild constipation. 11/11/20   Susa Day, MD  famotidine (PEPCID) 10 MG tablet Take 10 mg by mouth daily as needed for  heartburn or indigestion.    [provider]  oxybutynin (DITROPAN-XL) 5 MG 24 hr tablet Take 5 mg by mouth 2 (two) times daily.    [provider]  polyethylene glycol (MIRALAX / GLYCOLAX) 17 g packet Take 17 g by mouth daily. 11/11/20   Susa Day, MD  Probiotic Product (PROBIOTIC-10 PO) Take 1 tablet by mouth 3 (three) times a week.    [provider]  rosuvastatin (CRESTOR) 20 MG tablet 1 tablet    [provider]  tretinoin (RETIN-A) 0.05 % cream Apply to affected area externally at bedtime 12/15/20   Sheffield, Vida Roller R, PA-C  valsartan-hydrochlorothiazide (DIOVAN-HCT) 160-12.5 MG tablet Take 1 tablet by mouth daily.    [provider]  zolpidem (AMBIEN) 10 MG tablet Take 10 mg by mouth at bedtime. 06/25/17   [provider]     Objective    Physical Exam: Vitals:   02/03/21 1730 02/03/21 1745 02/03/21 1800 02/03/21 1815  BP: 109/74 133/77 119/71 113/68  Pulse: (!) 122 (!) 134 (!) 107 72  Resp: 16 (!) 24 (!) 21 18  Temp:      TempSrc:      SpO2: 97% 94% 96% 96%  Weight:      Height:        General: appears to be stated age; alert, oriented Skin: warm, dry, no rash Head:  AT/[place] Mouth:  Oral mucosa membranes appear moist, normal dentition Neck: supple; trachea midline Heart: Irregular, but rate controlled; did not appreciate any M/R/G Lungs: CTAB, did not appreciate any wheezes, rales, or rhonchi Abdomen: + BS; soft, ND, NT Vascular: 2+ pedal pulses b/l; 2+ radial pulses b/l Extremities: no peripheral edema, no muscle wasting Neuro: strength and sensation intact in upper and lower extremities b/l     Labs on Admission: I have personally reviewed following labs and imaging studies  CBC: Recent Labs  Lab 02/03/21 1545  WBC 15.4*  NEUTROABS 12.8*  HGB 11.9*  HCT 35.9*  MCV 94.2  PLT 992   Basic Metabolic Panel: Recent Labs  Lab 02/03/21 1545  NA 135  K 3.2*  CL 103  CO2 24  GLUCOSE 126*  BUN 11   CREATININE 0.84  CALCIUM 8.8*  MG 2.1  PHOS 3.5   GFR: Estimated Creatinine Clearance: 54.5 mL/min (by C-G formula based on SCr of 0.84 mg/dL). Liver Function Tests: Recent Labs  Lab 02/03/21 1545  AST 19  ALT 18  ALKPHOS 55  BILITOT 0.6  PROT 6.9  ALBUMIN 3.8   No results for input(s): LIPASE, AMYLASE in the last 168 hours. No results for input(s): AMMONIA in the last 168 hours. Coagulation Profile: Recent Labs  Lab 02/03/21 1545  INR 0.9  Cardiac Enzymes: No results for input(s): CKTOTAL, CKMB, CKMBINDEX, TROPONINI in the last 168 hours. BNP (last 3 results) No results for input(s): PROBNP in the last 8760 hours. HbA1C: No results for input(s): HGBA1C in the last 72 hours. CBG: No results for input(s): GLUCAP in the last 168 hours. Lipid Profile: No results for input(s): CHOL, HDL, LDLCALC, TRIG, CHOLHDL, LDLDIRECT in the last 72 hours. Thyroid Function Tests: Recent Labs    02/03/21 1545  TSH 6.639*   Anemia Panel: No results for input(s): VITAMINB12, FOLATE, FERRITIN, TIBC, IRON, RETICCTPCT in the last 72 hours. Urine analysis:    Component Value Date/Time   COLORURINE YELLOW 02/03/2021 1715   APPEARANCEUR HAZY (A) 02/03/2021 1715   LABSPEC 1.006 02/03/2021 1715   PHURINE 6.0 02/03/2021 1715   GLUCOSEU NEGATIVE 02/03/2021 1715   HGBUR SMALL (A) 02/03/2021 1715   BILIRUBINUR NEGATIVE 02/03/2021 1715   KETONESUR 5 (A) 02/03/2021 1715   PROTEINUR NEGATIVE 02/03/2021 1715   NITRITE POSITIVE (A) 02/03/2021 1715   LEUKOCYTESUR LARGE (A) 02/03/2021 1715    Radiological Exams on Admission: DG Chest Port 1 View  Result Date: 02/03/2021 CLINICAL DATA:  Weakness with blurred vision. EXAM: PORTABLE CHEST 1 VIEW COMPARISON:  May 24, 2018 FINDINGS: The heart size and mediastinal contours are within normal limits. Both lungs are clear. A chronic seventh left rib fracture is seen. IMPRESSION: No active cardiopulmonary disease. Electronically Signed   By:  Virgina Norfolk M.D.   On: 02/03/2021 15:47     EKG: Independently reviewed, with result as described above.    Assessment/Plan   Jodi Cline is a 80 y.o. female with medical history significant for hypertension, hyperlipidemia, peripheral artery disease, who is admitted to [facility] on 02/03/2021 with new onset atrial fibrillation with RVR after presenting from home to [facility] ED for evaluation of loss of consciousness.   Principal Problem:   Atrial fibrillation with RVR (HCC) Active Problems:   Benign essential HTN   Syncope   Hypokalemia   Sepsis ([place])   Acute cystitis   Urinary incontinence      #) Atrial fibrillation with RVR: In the setting of no known prior history of paroxysmal atrial fibrillation, the patient presents today in atrial fibrillation RVR with ventricular rates in the 140's initially have presenting for evaluation of single syncopal episode earlier today.  Aside from dysuria in the setting of sepsis due to UTI, the patient appears otherwise asymptomatic with ventricular rates in the 140s, raising the question of the true chronicity of the patient's atrial fibrillation given apparent relative asymptomatic nature and response to peer atrial fibrillation with RVR.    Blood pressure tolerated ventricular rates in the 140s, without evidence of hypotension.  Improving rate control following initiation of diltiazem drip in the ED, with ensuing ventricular rates, while still in atrial fibrillation, noted to be in the 80s to 90s, while maintaining normotensive blood pressures.  In terms of factors leading to presenting atrial fibrillation with RVR, differential includes physiologic stress stemming from presenting sepsis due to urinary tract infection, as further described below versus compensatory tachycardic pathway in the context of intravascular depletion following blood donation earlier in the day as well as further dehydration and intravascular depletion stemming  from a prolonged period of working in her yard in the hot sun earlier today.  The patient reports consuming 1 to 2 glasses of wine per day on average, although her conveyance of this history does not appear consistent with increased risk for  holiday heart.  ACS felt to be less likely, in the absence of any associated chest pain.  The patient likely has some degree of underlying coronary disease, given nonspecific T wave/ST depression noted on EKG while in atrial fibrillation with ventricular rates in the 130s, most likely representing rate related type II ischemia.  The on-call cardiologist, Dr. Johnsie Cancel, has been consulted with additional recommendations to follow, as further described above.   In the setting of a CHA2DS2-VASc score of 4, there is an indication for the patient to be on chronic anticoagulation for thromboembolic prophylaxis, with anticipation of additional discussions regarding indications for versus risk versus benefits versus alternatives to chronic anticoagulation for thromboembolic prophylaxis in this setting.  Of note, the patient is not currently on any blood thinners at home, including no aspirin.  She is also on no AV nodal blocking agents at home.  We will pursue echocardiogram in the morning. Will also hold home oxybutynin for now to temporarily eliminate associated anticholinergic side effects of this medication.    Plan: Monitor strict I's and O's and daily weights. Monitor on telemetry.  Evaluation and management of presenting hypokalemia, with supplementation efforts, as further described below.  Repeat BMP and serum magnesium levels in the morning.  Further evaluation management of sepsis due to UTI, as further described below.  Repeat CBC in the morning.  Continue diltiazem drip overnight, with plan for initiation of oral AV nodal blocking agent 1 to 2 hours prior to discontinuation of diltiazem drip in order to reduce risk for rebound tachycardia.  Echocardiogram has been  ordered for the morning.  Repeat EKG to evaluate for resolution of nonspecific T wave/ST changes now that rate control has improved to confirm suspected rate related process.  Cardiology consulted, with additional recommendations to follow, as above.  Check TSH and urinary drug screen.  Hold home oxybutynin for now, as above.  Further discussions regarding initiation of chronic anticoagulation.  Repeat troponin level.      #) Sepsis due to acute cystitis: Diagnosis on the basis of 2 days of dysuria, with SIRS criteria met via presenting leukocytosis, tachycardia, and tachypnea, while presenting urinalysis suggestive of urinary tract infection in the context of a hazy specimen with greater than 50 white blood cells, nitrate positive, and large leukocyte Estrace.  Patient sepsis does not meet criteria to be considered severe in nature in the absence of any evidence of associated endorgan damage, including presenting nonelevated lactic acid level of 1.4.  Given multiple reported allergies, including allergies to the penicillin class as well as the cephalosporins, with the latter associated with lip swelling, the patient was started on aztreonam in the ED. blood cultures x2 and urine sample sent for culture prior to initiation of this antibiotic.  No evidence of additional underlying infectious process, including negative COVID-19/influenza PCR that was performed earlier today.  Additionally, chest x-ray shows no evidence of acute cardiopulmonary process, including no evidence of infiltrate to suggest pneumonia.   Plan: Continue aztreonam per pharmacy consult, as above.  Monitor for results of blood cultures x2 and urine culture.  Repeat CBC with differential in the morning.       #) Syncope, one syncopal episode with associated prodrome that occurred earlier today.  Associated prodrome, there is decrease likelihood for contributing ventricular arrhythmia.  Suspect hypotension versus vasovagal syncope,  with associated increased syncopal risk in the setting of intravascular depletion as a consequence of having donated blood earlier in the day before subsequently developing further intravascular  depletion as a consequence of working in her garden for several hours in the hot sun in the absence of interval oral fluids, with further exacerbation via pharmacologic factors that include HCTZ as well as diminished compensatory vasoconstriction in the setting of home valsartan..  ACS is felt to be less likely, particularly absence of any recent chest pain, while high-sensitivity troponin I x2 were found to be nonelevated at 3.  Activity/ST depression on presenting EKG is suggestive of rate related ischemia as a type II supply demand mismatch in the setting of atrial fibrillation with RVR as opposed to representing a type I process due to acute plaque rupture.  In the setting of a document history of peripheral artery disease, will also check bilateral carotid ultrasound.  No clinical evidence to suggest acute stroke or seizures.  Of note, the patient is already received IV fluids, thereby decreasing the sensitivity for evaluation of orthostatic vital signs.   Plan: Monitor on telemetry.  Monitor continuous pulse oximetry.  Echocardiogram has been ordered for the morning.  Trend serial troponin.  Bilateral carotid ultrasound ordered for the morning, as above.  Further evaluation management of presenting hypokalemia, as further described below.  Repeat EKG.  Hold home HCTZ for now.  We will also hold valsartan for now.  Close monitoring of ensuing blood pressure via routine vital signs.      #) Hypokalemia: Presenting serum potassium noted to be 3.2.  In the ED, the patient has received potassium chloride 40 mill equivalents p.o. as well as 10 mg of IV potassium.  Of note, serum magnesium level found to be within normal limits at 2.1.  Plan: Repeat BMP in the morning, with close attention to interval trend in  serum potassium level.  Repeat serum magnesium level at that time as well.  Monitor on telemetry.      #) Essential hypertension: Outpatient antihypertensive regimen includes Norvasc, valsartan, and HCTZ.  In the setting of presenting syncope, with contributary concern for intravascular ablation, will hold antihypertensive medications for now, while closely monitoring ensuing blood pressure.  Plan: Hold home and hypertensive medications for now, as above.  Close monitoring of ensuing blood pressure via routine vital signs.  Monitor strict I's and O's and daily weights.  Repeat BMP in the morning.     #) Hyperlipidemia: On high intensity rosuvastatin as an outpatient.  Plan: Continue home statin.      #) Urinary incontinence: On oxybutynin as an outpatient.  In the setting of presenting atrial fibrillation with RVR, will hold home oxybutynin for now to limit its potential cholinergic side effects.   Plan: Hold home oxybutynin for now, as above.          DVT prophylaxis: SCDs Code Status: Full code Family Communication: none Disposition Plan: Per Rounding Team;  Consults called: EDP consulted on-call cardiologist, Dr. Johnsie Cancel, as further described above.  Admission status: Inpatient; PCU     Of note, this patient was added by me to the following Admit List/Treatment Team: [email or web address].      PLEASE NOTE THAT DRAGON DICTATION SOFTWARE WAS USED IN THE CONSTRUCTION OF THIS NOTE.   [facility] Pager [phone] From [place]  Otherwise, please contact night-coverage  [email or web address] Password [facility]   02/03/2021, 6:46 PM

## 2021-02-03 NOTE — ED Triage Notes (Signed)
Patient arrives via EMS from her home.  Patient reported that she donated blood today- was outside watering her plants and once inside her house, she became weak and her vision was blurred.  EMS reported that her friends came to check on her and she was found to be unresponsive sitting in her chair for a few seconds.  EMS reported that she had a regular heart rate and regular respirations.  The patient only remembers symptoms before passing out.  Patient answers all questions appropriately.

## 2021-02-03 NOTE — Progress Notes (Signed)
Pharmacy Antibiotic Note  Jodi Cline is a 80 y.o. female admitted on 02/03/2021 with UTI.  Pharmacy has been consulted for Aztreonam dosing.  Plan: Aztreonam 1gm IV q8h Follow renal function, culture results and sensitivities  Height: 5\' 6"  (167.6 cm) Weight: 72.6 kg (160 lb) IBW/kg (Calculated) : 59.3  Temp (24hrs), Avg:97.8 F (36.6 C), Min:97.8 F (36.6 C), Max:97.8 F (36.6 C)  Recent Labs  Lab 02/03/21 1545 02/03/21 1850  WBC 15.4*  --   CREATININE 0.84  --   LATICACIDVEN  --  1.4    Estimated Creatinine Clearance: 54.5 mL/min (by C-G formula based on SCr of 0.84 mg/dL).    Allergies  Allergen Reactions   Tramadol Swelling    Lips swell   Atorvastatin     Other reaction(s): myalgia   Benazepril Swelling    Lip swelling   Cephalexin Swelling    Lips Swelling   Penicillins Swelling    Reaction: unknown    Antimicrobials this admission:  02/03/21 Aztreonam >>  Dose adjustments this admission:    Microbiology results: 6/9 BCx:   6/9 UCx:     Thank you for allowing pharmacy to be a part of this patient's care.  Everette Rank, PharmD 02/03/2021 11:24 PM

## 2021-02-03 NOTE — Progress Notes (Signed)
Brief note regarding preliminary plan, with full H&P to follow:  80 year old female with no prior known history of atrial fibrillation atrial fibrillation with RVR rates in the 140s to 180s, while still in atrial fibrillation, and maintaining normotensive blood pressures.  Due to allergy reported to penicillin, the patient has been  started on aztreonam for coverage of a UTI, which will be continued for now.  Consult to cardiology placed.  Mildly hypokalemic upon presentation s/p potassium supplementation in the ED. Mg 2.1.  Continue diltiazem drip.    Babs Bertin, DO Hospitalist

## 2021-02-03 NOTE — ED Notes (Addendum)
Pt unsteady/ unstable,  needed one assist on and off bedside commode. Pt stated she felt "fine" and "not dizzy".

## 2021-02-04 ENCOUNTER — Inpatient Hospital Stay (HOSPITAL_BASED_OUTPATIENT_CLINIC_OR_DEPARTMENT_OTHER): Payer: Medicare HMO

## 2021-02-04 ENCOUNTER — Encounter (HOSPITAL_COMMUNITY): Payer: Self-pay | Admitting: Internal Medicine

## 2021-02-04 DIAGNOSIS — R32 Unspecified urinary incontinence: Secondary | ICD-10-CM | POA: Diagnosis present

## 2021-02-04 DIAGNOSIS — N3281 Overactive bladder: Secondary | ICD-10-CM | POA: Insufficient documentation

## 2021-02-04 DIAGNOSIS — R55 Syncope and collapse: Secondary | ICD-10-CM | POA: Diagnosis not present

## 2021-02-04 DIAGNOSIS — E876 Hypokalemia: Secondary | ICD-10-CM | POA: Diagnosis present

## 2021-02-04 DIAGNOSIS — I4891 Unspecified atrial fibrillation: Secondary | ICD-10-CM | POA: Diagnosis not present

## 2021-02-04 DIAGNOSIS — N3000 Acute cystitis without hematuria: Secondary | ICD-10-CM | POA: Diagnosis present

## 2021-02-04 DIAGNOSIS — A419 Sepsis, unspecified organism: Secondary | ICD-10-CM | POA: Diagnosis present

## 2021-02-04 LAB — RAPID URINE DRUG SCREEN, HOSP PERFORMED
Amphetamines: NOT DETECTED
Barbiturates: NOT DETECTED
Benzodiazepines: NOT DETECTED
Cocaine: NOT DETECTED
Opiates: NOT DETECTED
Tetrahydrocannabinol: NOT DETECTED

## 2021-02-04 LAB — CBC WITH DIFFERENTIAL/PLATELET
Abs Immature Granulocytes: 0.03 10*3/uL (ref 0.00–0.07)
Basophils Absolute: 0 10*3/uL (ref 0.0–0.1)
Basophils Relative: 0 %
Eosinophils Absolute: 0.1 10*3/uL (ref 0.0–0.5)
Eosinophils Relative: 1 %
HCT: 30.4 % — ABNORMAL LOW (ref 36.0–46.0)
Hemoglobin: 10.1 g/dL — ABNORMAL LOW (ref 12.0–15.0)
Immature Granulocytes: 0 %
Lymphocytes Relative: 20 %
Lymphs Abs: 1.9 10*3/uL (ref 0.7–4.0)
MCH: 31.2 pg (ref 26.0–34.0)
MCHC: 33.2 g/dL (ref 30.0–36.0)
MCV: 93.8 fL (ref 80.0–100.0)
Monocytes Absolute: 0.7 10*3/uL (ref 0.1–1.0)
Monocytes Relative: 8 %
Neutro Abs: 6.5 10*3/uL (ref 1.7–7.7)
Neutrophils Relative %: 71 %
Platelets: 147 10*3/uL — ABNORMAL LOW (ref 150–400)
RBC: 3.24 MIL/uL — ABNORMAL LOW (ref 3.87–5.11)
RDW: 13.2 % (ref 11.5–15.5)
WBC: 9.2 10*3/uL (ref 4.0–10.5)
nRBC: 0 % (ref 0.0–0.2)

## 2021-02-04 LAB — MAGNESIUM: Magnesium: 1.9 mg/dL (ref 1.7–2.4)

## 2021-02-04 LAB — COMPREHENSIVE METABOLIC PANEL
ALT: 15 U/L (ref 0–44)
AST: 15 U/L (ref 15–41)
Albumin: 3.4 g/dL — ABNORMAL LOW (ref 3.5–5.0)
Alkaline Phosphatase: 47 U/L (ref 38–126)
Anion gap: 8 (ref 5–15)
BUN: 8 mg/dL (ref 8–23)
CO2: 24 mmol/L (ref 22–32)
Calcium: 9.1 mg/dL (ref 8.9–10.3)
Chloride: 107 mmol/L (ref 98–111)
Creatinine, Ser: 0.64 mg/dL (ref 0.44–1.00)
GFR, Estimated: >60 mL/min (ref >60)
Glucose, Bld: 104 mg/dL — ABNORMAL HIGH (ref 70–99)
Potassium: 3.7 mmol/L (ref 3.5–5.1)
Sodium: 139 mmol/L (ref 135–145)
Total Bilirubin: 0.1 mg/dL — ABNORMAL LOW (ref 0.3–1.2)
Total Protein: 6 g/dL — ABNORMAL LOW (ref 6.5–8.1)

## 2021-02-04 LAB — LACTIC ACID, PLASMA: Lactic Acid, Venous: 0.9 mmol/L (ref 0.5–1.9)

## 2021-02-04 LAB — TROPONIN I (HIGH SENSITIVITY): Troponin I (High Sensitivity): 4 ng/L (ref <18)

## 2021-02-04 MED ORDER — TRETINOIN 0.05 % EX CREA
1.0000 | TOPICAL_CREAM | CUTANEOUS | 0 refills | Status: DC
Start: 2021-02-04 — End: 2022-01-09

## 2021-02-04 MED ORDER — POLYETHYLENE GLYCOL 3350 17 G PO PACK: 17.0000 g | PACK | Freq: Every day | ORAL | 1 refills | Status: AC | PRN

## 2021-02-04 MED ORDER — SULFAMETHOXAZOLE-TRIMETHOPRIM 800-160 MG PO TABS: 1.0000 | ORAL_TABLET | Freq: Two times a day (BID) | ORAL | 0 refills | Status: AC

## 2021-02-04 MED ORDER — APIXABAN 2.5 MG PO TABS: 2.5000 mg | ORAL_TABLET | Freq: Two times a day (BID) | ORAL | 1 refills | Status: DC

## 2021-02-04 MED ORDER — APIXABAN 2.5 MG PO TABS
2.5000 mg | ORAL_TABLET | Freq: Two times a day (BID) | ORAL | Status: DC
  Administered 2021-02-04: 2.5 mg via ORAL
  Filled 2021-02-04 (×2): qty 1

## 2021-02-04 MED ORDER — DILTIAZEM HCL ER COATED BEADS 120 MG PO CP24
240.0000 mg | ORAL_CAPSULE | Freq: Every day | ORAL | Status: DC
  Administered 2021-02-04: 240 mg via ORAL
  Filled 2021-02-04: qty 2

## 2021-02-04 MED ORDER — DILTIAZEM HCL ER COATED BEADS 240 MG PO CP24: 240.0000 mg | ORAL_CAPSULE | Freq: Every day | ORAL | 1 refills | Status: DC

## 2021-02-04 NOTE — Progress Notes (Signed)
PHARMACY NOTE -  Aztreonam  Pharmacy has been assisting with dosing of aztreonam for UTI. Dosage remains stable at 2g IV q8 hr and further renal adjustments per institutional Pharmacy antibiotic protocol Will attempt to clarify PCN/Keflex allergies and ascertain if patient appropriate for cephalosporin  Pharmacy will sign off, following peripherally for culture results or dose adjustments. Please reconsult if a change in clinical status warrants re-evaluation of dosage.  Reuel Boom, PharmD, BCPS [phone] 02/04/2021, 10:48 AM

## 2021-02-04 NOTE — Care Management Obs Status (Signed)
CSW spoke with Pt via [place]   Patient Details  Name: Jodi Cline MRN: [id number] Date of Birth: April 14, 1941   Medicare Observation Status Notification Given:    Yes, CSW spoke with Pt via phone   Yarenis Cerino, LCSW 02/04/2021, 5:47 PM

## 2021-02-04 NOTE — Consult Note (Addendum)
Cardiology Consultation:   Patient ID: Jodi Cline MRN: [id number]; DOB: 10-01-1940  Admit date: 02/03/2021 Date of Consult: 02/04/2021  PCP:  Lavone Orn, MD   [facility] Prov[facility] Cardiologist:  Elouise Munroe, MD        Patient Profile:   Jodi Cline is a 80 y.o. female with a hx of HLD, HTN, chronic LBBB, skin cancers, and PAD who is being seen 02/04/2021 for the evaluation of atrial fib with RVR at the request of Dr. Marthenia Rolling.  History of Present Illness:   Jodi Cline with above hx and Echo 10/08/19 with EF 57%, mild LVH, G1DD, RV normal, trivial MR, triavil AR and normal nuc study 2004.  Pt was in her usual state of health and had donated blood yesterday then was outside watering her plants and her vision became blurred went into her home and went out.  Her friends came to check on her and she was unresponsive in chair.  She does not know how long she was out.  She woke up in ambulance.  EMS run sheet with SR and stable BP, though pt admits BP was 026 systolic when she gave blood and usually in 130s.    She went into atrial fib here in ER soon after arrival and placed on IV dilt.  She converted to SR after 6 pm.    She does have UTI and she believes she had for 3 days prior to admit.  She has hx cystitis and was having symptoms.   Her K+ was 3.2 as well.   She is on Pletal for PAD.    EKG:  The EKG was personally reviewed and demonstrates:  atrial fib with RVR and LBBB with HR 133.  Today EKG SR LBBB Telemetry:  Telemetry was personally reviewed and demonstrates:  SR and occ PVC  Na 139, K+ 3.7 BUN 8, Cr 0,64 alb 3.4  BNP 56.2  Hs troponin 3,3,4 WBC 9.2  Hgb 10.1 plts 147  TSH 6.639 Neg covid Nitrites +,  large leukocytes.   BP 135/69 R 21   P 72 and afebrile.   Currently feels well echo was just done.   Past Medical History:  Diagnosis Date   Anemia    Anxiety    Arthritis    BCC (basal cell carcinoma) 04/18/2005   Right mid thigh (CX35FU)   BCC (basal cell  carcinoma) 04/07/2009   right shoulder blade (Cx3)   Depression    GERD (gastroesophageal reflux disease)    Hyperlipidemia    Hypertension    Insomnia    Left bundle branch block (LBBB) 08/2019   Nodular basal cell carcinoma (BCC) 08/05/2013   right shoulder blade (EXC)   PAD (peripheral artery disease) ([place])    Pneumonia    as a child   SCC (squamous cell carcinoma) 02/19/2001   left arm (CX35FU)   SCC (squamous cell carcinoma) 04/23/2013   left shin (txpbx)   SCC (squamous cell carcinoma) 08/05/2013   left shin (txpbx)   SCC (squamous cell carcinoma) 05/07/2014   Left shin (txpbx)   SCC (squamous cell carcinoma) 08/04/2014   left shin (MOHS)   Superficial basal cell carcinoma (BCC) 07/13/2015   right thigh (tx p bx)    Past Surgical History:  Procedure Laterality Date   CESAREAN SECTION     COLONOSCOPY     LUMBAR LAMINECTOMY/DECOMPRESSION MICRODISCECTOMY N/A 11/11/2020   Procedure: Mircolumbar revision decompression Lumbar four-five bilaterally, excision of synovial cyst;  Surgeon: Tonita Cong,  Dellis Filbert, MD;  Location: [facility];  Service: Orthopedics;  Laterality: N/A;   THORACIC SPINE SURGERY     Dr. Tonita Cong   TUBAL LIGATION       Home Medications:  Prior to Admission medications   Medication Sig Start Date End Date Taking? Authorizing Provider  amLODipine (NORVASC) 10 MG tablet Take 1 tablet by mouth daily. 01/13/21  Yes [provider]  calcium carbonate (TUMS - DOSED IN MG ELEMENTAL CALCIUM) 500 MG chewable tablet Chew 500 mg by mouth daily as needed for indigestion. 07/09/17  Yes [provider]  cholecalciferol (VITAMIN D3) 25 MCG (1000 UNIT) tablet Take 1,000 Units by mouth daily.   Yes [provider]  cilostazol (PLETAL) 50 MG tablet Take 1 tablet (50 mg total) by mouth 2 (two) times daily. 03/02/20  Yes Wellington Hampshire, MD  Cyanocobalamin (VITAMIN B 12 PO) Take 1 tablet by mouth daily.   Yes [provider]  docusate sodium (COLACE) 100  MG capsule Take 1 capsule (100 mg total) by mouth 2 (two) times daily as needed for mild constipation. 11/11/20  Yes Susa Day, MD  famotidine (PEPCID) 20 MG tablet Take 20 mg by mouth daily as needed for heartburn or indigestion.   Yes [provider]  neomycin-polymyxin b-dexamethasone (MAXITROL) 3.5-10000-0.1 [place] 1 application into both eyes daily. 01/25/21  Yes [provider]  oxybutynin (DITROPAN-XL) 5 MG 24 hr tablet Take 5 mg by mouth 2 (two) times daily.   Yes [provider]  polyethylene glycol (MIRALAX / GLYCOLAX) 17 g packet Take 17 g by mouth daily. Patient taking differently: Take 17 g by mouth daily as needed for mild constipation. 11/11/20  Yes Susa Day, MD  Probiotic Product (PROBIOTIC-10 PO) Take 1 tablet by mouth daily.   Yes [provider]  rosuvastatin (CRESTOR) 20 MG tablet Take 20 mg by mouth daily.   Yes [provider]  tretinoin (RETIN-A) 0.05 % cream Apply to affected area externally at bedtime Patient taking differently: Apply 1 application topically See admin instructions. Takes 2-3 times a week as needed for face. 12/15/20  Yes Sheffield, Kelli R, PA-C  valsartan-hydrochlorothiazide (DIOVAN-HCT) 160-12.5 MG tablet Take 1 tablet by mouth daily.   Yes [provider]  zolpidem (AMBIEN) 10 MG tablet Take 10 mg by mouth at bedtime. 06/25/17  Yes [provider]  acetaminophen-codeine (TYLENOL #3) 300-30 MG tablet Take 1 tablet by mouth daily as needed for moderate pain. Patient not taking: Reported on 02/03/2021 08/19/20   Magnant, Gerrianne Scale, PA-C  amLODipine (NORVASC) 5 MG tablet Take 2 tablets (10 mg total) by mouth daily. Patient not taking: Reported on 02/03/2021 08/31/20   Elouise Munroe, MD    Inpatient Medications: Scheduled Meds:  cilostazol  50 mg Oral BID   melatonin  3 mg Oral QHS   rosuvastatin  20 mg Oral Daily   Continuous Infusions:  aztreonam Stopped (02/04/21 0867)    diltiazem (CARDIZEM) infusion 5 mg/hr (02/04/21 0518)   PRN Meds: acetaminophen **OR** acetaminophen  Allergies:    Allergies  Allergen Reactions   Tramadol Swelling    Lips swell   Atorvastatin     Other reaction(s): myalgia   Benazepril Swelling    Lip swelling   Cephalexin Swelling    Lips Swelling   Penicillins Swelling    Reaction: unknown    Social History:   Social History   Socioeconomic History   Marital status: Married    Spouse name: Not  on file   Number of children: Not on file   Years of education: Not on file   Highest education level: Not on file  Occupational History   Not on file  Tobacco Use   Smoking status: Former    Pack years: 0.00   Smokeless tobacco: Never   Tobacco comments:    Quit at age 15 years  Vaping Use   Vaping Use: Never used  Substance and Sexual Activity   Alcohol use: Yes    Alcohol/week: 14.0 standard drinks    Types: 14 Glasses of wine per week    Comment: 2 glasses of wine a day   Drug use: Never   Sexual activity: Not on file  Other Topics Concern   Not on file  Social History Narrative   Not on file   Social Determinants of Health   Financial Resource Strain: Not on file  Food Insecurity: Not on file  Transportation Needs: Not on file  Physical Activity: Not on file  Stress: Not on file  Social Connections: Not on file  Intimate Partner Violence: Not on file    Family History:    Family History  Problem Relation Age of Onset   Atrial fibrillation Brother    2 Brothers with atrial fib.  ROS:  Please see the history of present illness.  General:no colds or fevers, no weight changes Skin:no rashes or ulcers HEENT:no blurred vision, no congestion CV:see HPI PUL:see HPI GI:no diarrhea constipation or melena, no indigestion GU:no hematuria, + dysuria MS:no joint pain, + claudication Neuro:+ syncope, + lightheadedness Endo:no diabetes, no thyroid disease  All other ROS reviewed and negative.      Physical Exam/Data:   Vitals:   02/04/21 0245 02/04/21 0545 02/04/21 0630 02/04/21 0745  BP: 116/60 129/66 (!) 133/58 137/71  Pulse: 65 69 65 65  Resp: 16 (!) 22 18 (!) 23  Temp:      TempSrc:      SpO2: 96% 96% 98% 96%  Weight:      Height:        Intake/Output Summary (Last 24 hours) at 02/04/2021 1001 Last data filed at 02/04/2021 0527 Gross per 24 hour  Intake 1065.7 ml  Output 700 ml  Net 365.7 ml   Last 3 Weights 02/03/2021 11/30/2020 11/11/2020  Weight (lbs) 160 lb 158 lb 9.6 oz 159 lb  Weight (kg) 72.576 kg 71.94 kg 72.122 kg     Body mass index is 25.82 kg/m.  General:  Well nourished, well developed, in no acute distress HEENT: normal Lymph: no adenopathy Neck: no JVD Endocrine:  No thryomegaly Vascular: No carotid bruits; pedal pulses 2+ bilaterally   Cardiac:  normal S1, S2; RRR; no murmur  Lungs:  few rales in the bases to auscultation bilaterally, no wheezing, or rhonchi   Abd: soft, nontender, no hepatomegaly  Ext: no edema Musculoskeletal:  No deformities, BUE and BLE strength normal and equal Skin: warm and dry  Neuro:  alert and oriented X 3 MAE follows commands, no focal abnormalities noted Psych:  Normal affect    Relevant CV Studies: Echo pending   Echo 10/08/19 IMPRESSIONS    1. Left ventricular ejection fraction by 3D volume is 57 %. The left  ventricle has normal function. There is mildly increased concentric left  ventricular hypertrophy. Left ventricular diastolic parameters are  consistent with Grade I diastolic  dysfunction (impaired relaxation).   2. Right ventricular systolic function is normal. The right ventricular  size is normal.  There is normal pulmonary artery systolic pressure.   3. The mitral valve is normal in structure and function. trivial mitral  valve regurgitation. No evidence of mitral stenosis.   4. The aortic valve is tricuspid. Aortic valve regurgitation is trivial .  Mild aortic valve sclerosis is present, with  no evidence of aortic valve  stenosis.   5. Ascending aorta measures 38 mm at the proximal-mid ascending aorta.  This may be upper limit of normal for age and indexed to BSA.   6. The inferior vena cava is normal in size with greater than 50%  respiratory variability, suggesting right atrial pressure of 3 mmHg.   Laboratory Data:  High Sensitivity Troponin:   Recent Labs  Lab 02/03/21 1545 02/03/21 1850 02/04/21 0400  TROPONINIHS 3 3 4      Chemistry Recent Labs  Lab 02/03/21 1545 02/04/21 0400  NA 135 139  K 3.2* 3.7  CL 103 107  CO2 24 24  GLUCOSE 126* 104*  BUN 11 8  CREATININE 0.84 0.64  CALCIUM 8.8* 9.1  GFRNONAA >60 >60  ANIONGAP 8 8    Recent Labs  Lab 02/03/21 1545 02/04/21 0400  PROT 6.9 6.0*  ALBUMIN 3.8 3.4*  AST 19 15  ALT 18 15  ALKPHOS 55 47  BILITOT 0.6 0.1*   Hematology Recent Labs  Lab 02/03/21 1545 02/04/21 0400  WBC 15.4* 9.2  RBC 3.81* 3.24*  HGB 11.9* 10.1*  HCT 35.9* 30.4*  MCV 94.2 93.8  MCH 31.2 31.2  MCHC 33.1 33.2  RDW 13.5 13.2  PLT 186 147*   BNP Recent Labs  Lab 02/03/21 1545  BNP 56.2    DDimer No results for input(s): DDIMER in the last 168 hours.   Radiology/Studies:  DG Chest Port 1 View  Result Date: 02/03/2021 CLINICAL DATA:  Weakness with blurred vision. EXAM: PORTABLE CHEST 1 VIEW COMPARISON:  May 24, 2018 FINDINGS: The heart size and mediastinal contours are within normal limits. Both lungs are clear. A chronic seventh left rib fracture is seen. IMPRESSION: No active cardiopulmonary disease. Electronically Signed   By: Virgina Norfolk M.D.   On: 02/03/2021 15:47     Assessment and Plan:   Syncope most likely due to UTI and giving a unit of blood.  Prob low BP though with EMS 416 systolic BP.  Was in [place] by EMS.   PAF pt went into a fib in ER with RVR and was in from about 2:15 pm until 6:00 pm - converted with dilt drip.  chad2S2Vasc of 5.  With short duration of a fib will add eliquis at lower  dose 2.5 BID and continue pletal and may be able to stop in 3-4 weeks.  Will have her follow up with Dr. Lujean Rave 6/30 at 8:40 AM --  will stop amlodipine and add po dilt. If echo with low EF will readjust meds PAD stable on pletal.  HLD on statin per PCP.   Risk Assessment/Risk Scores:          CHA2DS2-VASc Score = 5  This indicates a 7.2% annual risk of stroke. The patient's score is based upon: CHF History: No HTN History: Yes Diabetes History: No Stroke History: No Vascular Disease History: Yes Age Score: 2 Gender Score: 1        For questions or updates, please contact [facility] Please consult [email or web address] for contact info under    Signed, Cecilie Kicks, NP  02/04/2021 10:01 AM   Patient examined chart reviewed discussed  care with son Shanon Brow, patient and PA She is in NSR now. PAF precipitated by blood donation and UTI TTE pending but was normal a year ago. Has PVD with 1 vessel run off bilaterally she has good PT bilaterally Pletal important for her leg pains given interaction with anticoagulation , age , relatively low weight favor low dose eliquis for CHADVASC 5 and 14 hours PAF now converted. Suspect she can stay on low dose eliquis for 3 weeks then decide if stopping it appropriate May need outpatient monitor Risks of bleeding with combination of Pletal and low dose eliquis discussed  D/c norvasc change to cardizem orally for HTN and PAF  Jenkins Rouge MD [facility]

## 2021-02-04 NOTE — Discharge Summary (Signed)
Physician Discharge Summary  Patient ID: Jodi Cline MRN: [id number] DOB/AGE: 81-Jan-1942 81 y.o.  Admit date: 02/03/2021 Discharge date: 02/04/2021  Admission Diagnoses:  Discharge Diagnoses:  Principal Problem:   Atrial fibrillation with RVR ([place]) Active Problems:   Benign essential HTN   Syncope   Hypokalemia   Sepsis ([place])   Acute cystitis   Urinary incontinence   Discharged Condition: stable  Hospital Course: Patient is an 80 year old Caucasian female with past medical history significant for hypertension, hyperlipidemia and peripheral artery disease.  Patient was admitted with syncope, new onset atrial fibrillation with RVR and UTI.  Patient passed out after donating blood earlier in the day.  Patient had had symptoms of UTI about 3 days prior to admission.  UA was positive for nitrites.  Final urine cultures are pending.  Due to medication allergies, patient was managed with IV aztreonam during the hospital stay.  Patient will be discharged on Bactrim double strength 1 tab twice daily for the next 3 days.  Final urine culture is still pending.  The primary care provider will kindly follow the result of urine culture.  A. fib RVR management was directed by the cardiology team.  Patient was managed with IV Cardizem initially, and then converted to oral.  Patient is back to sinus rhythm.  Eliquis 2.5 Mg p.o. twice daily has been started.  Patient's heart rate is currently controlled.  Syncope has resolved.  Patient is eager to be discharged back home.  Cardiology team has cleared patient for discharge.  Patient and patient's son insist on being discharged back home.  Patient was worked up for syncope.  Patient will follow-up with a primary care provider and cardiology within 1 week of discharge.  Consults: cardiology  Significant Diagnostic Studies: labs: UA reveals positive nitrite.  Final urine culture still pending.  Discharge Exam: Blood pressure (!) 144/67, pulse 69, temperature  97.8 F (36.6 C), temperature source Oral, resp. rate (!) 22, height 5\' 6"  (1.676 m), weight 72.6 kg, SpO2 94 %.   Disposition: Discharge disposition: 01-Home or Self Care   Discharge Instructions     Diet - low sodium heart healthy   Complete by: As directed    Increase activity slowly   Complete by: As directed       Allergies as of 02/04/2021       Reactions   Tramadol Swelling   Lips swell   Atorvastatin    Other reaction(s): myalgia   Benazepril Swelling   Lip swelling   Cephalexin Swelling   Lips Swelling   Penicillins Swelling   Reaction: unknown        Medication List     STOP taking these medications    acetaminophen-codeine 300-30 MG tablet Commonly known as: TYLENOL #3   amLODipine 10 MG tablet Commonly known as: NORVASC   amLODipine 5 MG tablet Commonly known as: NORVASC       TAKE these medications    apixaban 2.5 MG Tabs tablet Commonly known as: ELIQUIS Take 1 tablet (2.5 mg total) by mouth 2 (two) times daily.   calcium carbonate 500 MG chewable tablet Commonly known as: TUMS - dosed in mg elemental calcium Chew 500 mg by mouth daily as needed for indigestion.   cholecalciferol 25 MCG (1000 UNIT) tablet Commonly known as: VITAMIN D3 Take 1,000 Units by mouth daily.   cilostazol 50 MG tablet Commonly known as: PLETAL Take 1 tablet (50 mg total) by mouth 2 (two) times daily.   diltiazem 240  MG 24 hr capsule Commonly known as: CARDIZEM CD Take 1 capsule (240 mg total) by mouth daily. Start taking on: February 05, 2021   docusate sodium 100 MG capsule Commonly known as: Colace Take 1 capsule (100 mg total) by mouth 2 (two) times daily as needed for mild constipation.   famotidine 20 MG tablet Commonly known as: PEPCID Take 20 mg by mouth daily as needed for heartburn or indigestion.   neomycin-polymyxin b-dexamethasone 3.5-10000-0.1 Oint Commonly known as: MAXITROL Place 1 application into both eyes daily.   oxybutynin 5 MG  24 hr tablet Commonly known as: DITROPAN-XL Take 5 mg by mouth 2 (two) times daily.   polyethylene glycol 17 g packet Commonly known as: MIRALAX / GLYCOLAX Take 17 g by mouth daily as needed for mild constipation.   PROBIOTIC-10 PO Take 1 tablet by mouth daily.   rosuvastatin 20 MG tablet Commonly known as: CRESTOR Take 20 mg by mouth daily.   sulfamethoxazole-trimethoprim 800-160 MG tablet Commonly known as: BACTRIM DS Take 1 tablet by mouth 2 (two) times daily for 3 days.   tretinoin 0.05 % cream Commonly known as: RETIN-A Apply 1 application topically See admin instructions. Takes 2-3 times a week as needed for face.   valsartan-hydrochlorothiazide 160-12.5 MG tablet Commonly known as: DIOVAN-HCT Take 1 tablet by mouth daily.   VITAMIN B 12 PO Take 1 tablet by mouth daily.   zolpidem 10 MG tablet Commonly known as: AMBIEN Take 10 mg by mouth at bedtime.        Follow-up Information     Elouise Munroe, MD Follow up on 02/24/2021.   Specialties: Cardiology, Radiology Why: at 8:40 AM Contact information: [address] [place] [address] [phone]                 Signed: Bonnell Public 02/04/2021, 5:29 PM

## 2021-02-04 NOTE — Progress Notes (Signed)
Carotid duplex has been completed.  Results can be found under chart review under CV PROC. 02/04/2021 11:09 AM Rockie Schnoor RVT, RDMS

## 2021-02-04 NOTE — Care Management CC44 (Signed)
Condition Code 44 Documentation Completed  Patient Details  Name: Jodi Cline MRN: [id number] Date of Birth: Jul 15, 1941   Condition Code 44 given:   Yes Patient signature on Condition Code 44 notice:   CSW signed for Pt with verbal permission Documentation of 2 MD's agreement:  Yes  Code 44 added to claim:   Yes    Andrena Margerum, LCSW 02/04/2021, 5:49 PM

## 2021-02-04 NOTE — Discharge Instructions (Signed)

## 2021-02-06 LAB — URINE CULTURE: Culture: >=100000 — AB

## 2021-02-07 ENCOUNTER — Telehealth: Payer: Self-pay | Admitting: Emergency Medicine

## 2021-02-07 NOTE — Telephone Encounter (Signed)
Post ED Visit - Positive Culture Follow-up  Culture report reviewed by antimicrobial stewardship pharmacist: [facility] Team []  Elenor Quinones, Pharm.D. []  Heide Guile, Pharm.D., BCPS AQ-ID []  Parks Neptune, Pharm.D., BCPS []  Alycia Rossetti, Pharm.D., BCPS []  [place], Pharm.D., BCPS, AAHIVP []  Legrand Como, Pharm.D., BCPS, AAHIVP []  Salome Arnt, PharmD, BCPS []  Johnnette Gourd, PharmD, BCPS []  Hughes Better, PharmD, BCPS []  Leeroy Cha, PharmD []  Laqueta Linden, PharmD, BCPS []  Albertina Parr, PharmD  [place] Team []  Leodis Sias, PharmD []  Lindell Spar, PharmD []  Royetta Asal, PharmD []  Graylin Shiver, Rph []  Rema Fendt) Glennon Mac, PharmD []  Arlyn Dunning, PharmD []  Netta Cedars, PharmD []  Dia Sitter, PharmD []  Leone Haven, PharmD []  Gretta Arab, PharmD []  Theodis Shove, PharmD []  Peggyann Juba, PharmD []  Reuel Boom, PharmD   Positive urine culture Treated with sulfamethoxazole-trimethoprim, organism sensitive to the same and no further patient follow-up is required at this time.  Hazle Nordmann 02/07/2021, 10:39 AM

## 2021-02-08 LAB — CULTURE, BLOOD (ROUTINE X 2): Culture: NO GROWTH

## 2021-02-09 DIAGNOSIS — I10 Essential (primary) hypertension: Secondary | ICD-10-CM | POA: Diagnosis not present

## 2021-02-09 DIAGNOSIS — N390 Urinary tract infection, site not specified: Secondary | ICD-10-CM | POA: Diagnosis not present

## 2021-02-09 DIAGNOSIS — N3281 Overactive bladder: Secondary | ICD-10-CM | POA: Diagnosis not present

## 2021-02-09 DIAGNOSIS — I480 Paroxysmal atrial fibrillation: Secondary | ICD-10-CM | POA: Diagnosis not present

## 2021-02-23 NOTE — Progress Notes (Signed)
Cardiology Office Note:    Date:  02/24/2021   ID:  Jodi Cline, DOB October 15, 1940, MRN [id number]  PCP:  Lavone Orn, MD  Cardiologist:  Elouise Munroe, MD  Electrophysiologist:  None   Referring MD: Lavone Orn, MD   Chief Complaint/Reason for Referral: PAD, LBBB, HLD  History of Present Illness:    Jodi Cline is a 80 y.o. female with a history of hyperlipidemia, hypertension, squamous cell carcinomas and basal cell carcinomas of the skin, who presents today for follow up of PAD, and hyperlipidemia.  Today, she feels great.. Recent hospitalization for syncope and afib RVR after giving blood. She tells [place] that EMS was called because she was found unconscious in her own home. At her ED visit, she was told she had Afib, this is new. She says she has been feeling well since then.    She says she has been feeling well since then.The pt believes that she does not Afib since. We discussed her stroke risk in the context of very brief, situational new onset afib.   She wanted to be taken off the apixaban (Eliquis). We reviewed all options including indefinite anticoagulation vs review with cardiac monitor. Pt decides to partake in having a heart monitor. If the heart monitor results are unremarkable then we will consider discontinuing Eliquis.   She says her ankles swells at night. This is concerning to her but also says its not overly bothersome. She believes when she is off her feet the swelling goes down when she is in the comfort in her own bed. She does not wear compression socks. Of note, she has some varicose veins in her LE's.   Patient denies chest pain, shortness of breath, palpations, headaches, lightheadedness, syncope,orthopnea, or PND.   Past Medical History:  Diagnosis Date   Anemia    Anxiety    Arthritis    BCC (basal cell carcinoma) 04/18/2005   Right mid thigh (CX35FU)   BCC (basal cell carcinoma) 04/07/2009   right shoulder blade (Cx3)   Depression    GERD  (gastroesophageal reflux disease)    Hyperlipidemia    Hypertension    Insomnia    Left bundle branch block (LBBB) 08/2019   Nodular basal cell carcinoma (BCC) 08/05/2013   right shoulder blade (EXC)   PAD (peripheral artery disease) ([place])    Pneumonia    as a child   SCC (squamous cell carcinoma) 02/19/2001   left arm (CX35FU)   SCC (squamous cell carcinoma) 04/23/2013   left shin (txpbx)   SCC (squamous cell carcinoma) 08/05/2013   left shin (txpbx)   SCC (squamous cell carcinoma) 05/07/2014   Left shin (txpbx)   SCC (squamous cell carcinoma) 08/04/2014   left shin (MOHS)   Superficial basal cell carcinoma (BCC) 07/13/2015   right thigh (tx p bx)    Past Surgical History:  Procedure Laterality Date   CESAREAN SECTION     COLONOSCOPY     LUMBAR LAMINECTOMY/DECOMPRESSION MICRODISCECTOMY N/A 11/11/2020   Procedure: Mircolumbar revision decompression Lumbar four-five bilaterally, excision of synovial cyst;  Surgeon: Susa Day, MD;  Location: [place];  Service: Orthopedics;  Laterality: N/A;   THORACIC SPINE SURGERY     Dr. Tonita Cong   TUBAL LIGATION      Current Medications: Current Meds  Medication Sig   calcium carbonate (TUMS - DOSED IN MG ELEMENTAL CALCIUM) 500 MG chewable tablet Chew 500 mg by mouth daily as needed for indigestion.   cholecalciferol (VITAMIN D3) 25  MCG (1000 UNIT) tablet Take 1,000 Units by mouth daily.   cilostazol (PLETAL) 50 MG tablet Take 1 tablet (50 mg total) by mouth 2 (two) times daily.   Cyanocobalamin (VITAMIN B 12 PO) Take 1 tablet by mouth daily.   diltiazem (CARDIZEM CD) 240 MG 24 hr capsule Take 1 capsule (240 mg total) by mouth daily.   docusate sodium (COLACE) 100 MG capsule Take 1 capsule (100 mg total) by mouth 2 (two) times daily as needed for mild constipation.   famotidine (PEPCID) 20 MG tablet Take 20 mg by mouth daily as needed for heartburn or indigestion.   oxybutynin (DITROPAN-XL) 5 MG 24 hr tablet Take 5 mg by mouth 2 (two)  times daily.   polyethylene glycol (MIRALAX / GLYCOLAX) 17 g packet Take 17 g by mouth daily as needed for mild constipation.   Probiotic Product (PROBIOTIC-10 PO) Take 1 tablet by mouth daily.   rosuvastatin (CRESTOR) 20 MG tablet Take 20 mg by mouth daily.   tretinoin (RETIN-A) 0.05 % cream Apply 1 application topically See admin instructions. Takes 2-3 times a week as needed for face.   valsartan-hydrochlorothiazide (DIOVAN-HCT) 160-12.5 MG tablet Take 1 tablet by mouth daily.   zolpidem (AMBIEN) 10 MG tablet Take 10 mg by mouth at bedtime.   [DISCONTINUED] apixaban (ELIQUIS) 2.5 MG TABS tablet Take 1 tablet (2.5 mg total) by mouth 2 (two) times daily.     Allergies:   Tramadol, Atorvastatin, Benazepril, Cephalexin, and Penicillins   Social History   Tobacco Use   Smoking status: Former    Pack years: 0.00   Smokeless tobacco: Never   Tobacco comments:    Quit at age 75 years  Vaping Use   Vaping Use: Never used  Substance Use Topics   Alcohol use: Yes    Alcohol/week: 14.0 standard drinks    Types: 14 Glasses of wine per week    Comment: 2 glasses of wine a day   Drug use: Never     Family History: The patient's family history includes Atrial fibrillation in her brother.  ROS:   Please see the history of present illness.    (+) Bilateral LE to the ankles  All other systems reviewed and are negative.  EKGs/Labs/Other Studies Reviewed:    The following studies were reviewed today: US Carotid Duplex 02/04/2021:  Right Carotid: Velocities in the right ICA are consistent with a 1-39% stenosis.The ECA appears <50% stenosed.   Left Carotid: Velocities in the left ICA are consistent with a 1-39% stenosis. Non-hemodynamically significant plaque <50% noted in the CCA. The ECA appears <50% stenosed.   Vertebrals:  Bilateral vertebral arteries demonstrate antegrade flow.  Subclavians: Normal flow hemodynamics were seen in bilateral subclavian arteries.  Echo 10/08/2019:  1.  Left ventricular ejection fraction by 3D volume is 57 %. The left  ventricle has normal function. There is mildly increased concentric left  ventricular hypertrophy. Left ventricular diastolic parameters are  consistent with Grade I diastolic  dysfunction (impaired relaxation).   2. Right ventricular systolic function is normal. The right ventricular  size is normal. There is normal pulmonary artery systolic pressure.   3. The mitral valve is normal in structure and function. trivial mitral  valve regurgitation. No evidence of mitral stenosis.   4. The aortic valve is tricuspid. Aortic valve regurgitation is trivial .  Mild aortic valve sclerosis is present, with no evidence of aortic valve  stenosis.   5. Ascending aorta measures 38 mm at the proximal-mid  ascending aorta.  This may be upper limit of normal for age and indexed to BSA.   6. The inferior vena cava is normal in size with greater than 50%  respiratory variability, suggesting right atrial pressure of 3 mmHg.   Comparison(s): No prior Echocardiogram.  US Abdominal Aorta 10/03/2019 :  Abdominal Aorta: No evidence of an abdominal aortic aneurysm was  visualized. The largest aortic measurement is 1.8 cm.  Stenosis:  Mild to moderate wall calcification noted. No evidence of stenosis seen.   IVC/Iliac: There is no evidence of thrombus involving the IVC.     [place] LE Doppler 09/12/2019:  Right: Resting right ankle-brachial index is within normal range.  No evidence of significant right lower extremity arterial disease. The  right toe-brachial index is normal.   Left: Resting left ankle-brachial index is within normal range.  No evidence of significant left lower extremity arterial disease. The left  toe-brachial index is normal.   EKG:   02/24/2021: Sinus rhythm, LBBB, HR 67 bpm, QRS 134ms  11/30/2020: NSR, LBBB 09/26/2019: Normal sinus rhythm, LBBB, QRS duration 152 ms.    Recent Labs: 02/03/2021: B Natriuretic Peptide  56.2; TSH 6.639 02/04/2021: ALT 15; BUN 8; Creatinine, Ser 0.64; Hemoglobin 10.1; Magnesium 1.9; Platelets 147; Potassium 3.7; Sodium 139  Recent Lipid Panel    Component Value Date/Time   CHOL 228 (H) 09/30/2019 0950   TRIG 105 09/30/2019 0950   HDL 95 09/30/2019 0950   CHOLHDL 2.4 09/30/2019 0950   LDLCALC 115 (H) 09/30/2019 0950    Physical Exam:    VS:  BP 136/70 (BP Location: Right Arm, Patient Position: Sitting, Cuff Size: Normal)   Pulse 67   Ht 5\' 6"  (1.676 m)   Wt 156 lb 12.8 oz (71.1 kg)   BMI 25.31 kg/m     Wt Readings from Last 5 Encounters:  02/03/21 160 lb (72.6 kg)  11/30/20 158 lb 9.6 oz (71.9 kg)  11/11/20 159 lb (72.1 kg)  11/09/20 159 lb 3.2 oz (72.2 kg)  08/31/20 152 lb 9.6 oz (69.2 kg)    Constitutional: No acute distress Eyes: sclera non-icteric, normal conjunctiva and lids ENMT: normal dentition, moist mucous membranes Cardiovascular: regular rhythm, normal rate, no murmurs. S1 and S2 normal. Radial pulses normal bilaterally. No jugular venous distention.  Respiratory: clear to auscultation bilaterally GI : normal bowel sounds, soft and nontender. No distention.   MSK: extremities warm, well perfused. LE edema. + varicose veins bilaterally. NEURO: grossly nonfocal exam, moves all extremities. PSYCH: alert and oriented x 3, normal mood and affect.   ASSESSMENT:    1. Atrial fibrillation, unspecified type ([place])     PLAN:    Atrial fibrillation, unspecified type ([place]) - Plan: CARDIAC EVENT MONITOR - will plan for 2 week event monitor and continued anticoagulation. If no afib, will plan to stop Eliquis. We discussed theoretically increased risk of recurrent afib, she would prefer not to remain on eliquis if no recurrent afib.  - started on diltiazem 240 mg daily, amlodipine stopped in the process.  PAD (peripheral artery disease) (HCC) -Stable. Continue aspirin 81 mg daily, Crestor 20 mg daily. Continue cilostazol per patient preference.     Hyperlipidemia, unspecified hyperlipidemia type-continue Crestor 20 mg daily.  Lipids can be repeated at her next primary care appointment. Last LDL 94.    Essential hypertension-continue valsartan HCTZ 160-12.5 mg, diltiazem 240 mg daily.    LBBB (left bundle branch block)-no abnormalities on echocardiogram.  No chest pain.  Total time  of encounter: 30 minutes total time of encounter, including 20 minutes spent in face-to-face patient care on the date of this encounter. This time includes coordination of care and counseling regarding above mentioned problem list. Remainder of non-face-to-face time involved reviewing chart documents/testing relevant to the patient encounter and documentation in the medical record. I have independently reviewed documentation from referring provider.   Cherlynn Kaiser, MD, [facility]    Medication Adjustments/Labs and Tests Ordered: Current medicines are reviewed at length with the patient today.  Concerns regarding medicines are outlined above.   Orders Placed This Encounter  Procedures   CARDIAC EVENT MONITOR    Meds ordered this encounter  Medications                  apixaban (ELIQUIS) 2.5 MG TABS tablet    Sig: Take 1 tablet (2.5 mg total) by mouth 2 (two) times daily.     Patient Instructions  Medication Instructions:  Continue Eliquis 2.5 mg Twice daily  *If you need a refill on your cardiac medications before your next appointment, please call your pharmacy*  Testing/Procedures:  [employer] Cardiac Event Monitor Instructions Your physician has requested you wear your cardiac event monitor for 14 days, (1-30). [employer] may call or text to confirm a shipping address. The monitor will be sent to a land address via UPS. [employer] will not ship a monitor to a PO BOX. It typically takes 3-5 days to receive your monitor after it has been enrolled. [employer] will assist with USPS tracking if your package is  delayed. The telephone number for [employer] is [phone]. Once you have received your monitor, please review the enclosed instructions. Instruction tutorials can also be viewed under help and settings on the enclosed cell phone. Your monitor has already been registered assigning a specific monitor serial # to you.  Applying the monitor Remove cell phone from case and turn it on. The cell phone works as Dealer and needs to be within [employer] of you at all times. The cell phone will need to be charged on a daily basis. We recommend you plug the cell phone into the enclosed charger at your bedside table every night.  Monitor batteries: You will receive two monitor batteries labelled #1 and #2. These are your recorders. Plug battery #2 onto the second connection on the enclosed charger. Keep one battery on the charger at all times. This will keep the monitor battery deactivated. It will also keep it fully charged for when you need to switch your monitor batteries. A small light will be blinking on the battery emblem when it is charging. The light on the battery emblem will remain on when the battery is fully charged.  Open package of a Monitor strip. Insert battery #1 into black hood on strip and gently squeeze monitor battery onto connection as indicated in instruction booklet. Set aside while preparing skin.  Choose location for your strip, vertical or horizontal, as indicated in the instruction booklet. Shave to remove all hair from location. There cannot be any lotions, oils, powders, or colognes on skin where monitor is to be applied. Wipe skin clean with enclosed Saline wipe. Dry skin completely.  Peel paper labeled #1 off the back of the Monitor strip exposing the adhesive. Place the monitor on the chest in the vertical or horizontal position shown in the instruction booklet. One arrow on the monitor strip must be pointing upward. Carefully remove paper labeled #2,  attaching remainder  of strip to your skin. Try not to create any folds or wrinkles in the strip as you apply it.  Firmly press and release the circle in the center of the monitor battery. You will hear a small beep. This is turning the monitor battery on. The heart emblem on the monitor battery will light up every 5 seconds if the monitor battery in turned on and connected to the patient securely. Do not push and hold the circle down as this turns the monitor battery off. The cell phone will locate the monitor battery. A screen will appear on the cell phone checking the connection of your monitor strip. This may read poor connection initially but change to good connection within the next minute. Once your monitor accepts the connection you will hear a series of 3 beeps followed by a climbing crescendo of beeps. A screen will appear on the cell phone showing the two monitor strip placement options. Touch the picture that demonstrates where you applied the monitor strip.  Your monitor strip and battery are waterproof. You are able to shower, bathe, or swim with the monitor on. They just ask you do not submerge deeper than 3 feet underwater. We recommend removing the monitor if you are swimming in a lake, river, or ocean.  Your monitor battery will need to be switched to a fully charged monitor battery approximately once a week. The cell phone will alert you of an action which needs to be made.  On the cell phone, tap for details to reveal connection status, monitor battery status, and cell phone battery status. The green dots indicates your monitor is in good status. A red dot indicates there is something that needs your attention.  To record a symptom, click the circle on the monitor battery. In 30-60 seconds a list of symptoms will appear on the cell phone. Select your symptom and tap save. Your monitor will record a sustained or significant arrhythmia regardless of you clicking the button.  Some patients do not feel the heart rhythm irregularities. [employer] will notify us of any serious or critical events.  Refer to instruction booklet for instructions on switching batteries, changing strips, the Do not disturb or Pause features, or any additional questions.  Call [employer] at [phone], to confirm your monitor is transmitting and record your baseline. They will answer any questions you may have regarding the monitor instructions at that time.  Returning the monitor to [place] all equipment back into blue box. Peel off strip of paper to expose adhesive and close box securely. There is a prepaid UPS shipping label on this box. Drop in a UPS drop box, or at a UPS facility like [employer]. You may also contact [employer] to arrange UPS to pick up monitor package at your home.  Follow-Up: At [facility], you and your health needs are our priority.  As part of our continuing mission to provide you with exceptional heart care, we have created designated Provider Care Teams.  These Care Teams include your primary Cardiologist (physician) and Advanced Practice Providers (APPs -  Physician Assistants and Nurse Practitioners) who all work together to provide you with the care you need, when you need it.  Your next appointment:   3 month(s)  The format for your next appointment:   In Person  Provider:   Cherlynn Kaiser, MD      Ardell Isaacs as a scribe for Elouise Munroe, MD.,have documented all relevant documentation on the behalf of Elouise Munroe,  MD,as directed by  Elouise Munroe, MD while in the presence of Elouise Munroe, MD.   I, Elouise Munroe, MD, have reviewed all documentation for this visit. The documentation on 02/24/21 for the exam, diagnosis, procedures, and orders are all accurate and complete.

## 2021-02-24 ENCOUNTER — Encounter: Payer: Self-pay | Admitting: *Deleted

## 2021-02-24 ENCOUNTER — Ambulatory Visit: Payer: Medicare HMO | Admitting: Internal Medicine

## 2021-02-24 ENCOUNTER — Other Ambulatory Visit: Payer: Self-pay

## 2021-02-24 VITALS — BP 136/70 | HR 67 | Ht 66.0 in | Wt 156.8 lb

## 2021-02-24 DIAGNOSIS — E785 Hyperlipidemia, unspecified: Secondary | ICD-10-CM

## 2021-02-24 DIAGNOSIS — I10 Essential (primary) hypertension: Secondary | ICD-10-CM

## 2021-02-24 DIAGNOSIS — Z79899 Other long term (current) drug therapy: Secondary | ICD-10-CM | POA: Diagnosis not present

## 2021-02-24 DIAGNOSIS — I4891 Unspecified atrial fibrillation: Secondary | ICD-10-CM | POA: Diagnosis not present

## 2021-02-24 DIAGNOSIS — I447 Left bundle-branch block, unspecified: Secondary | ICD-10-CM | POA: Diagnosis not present

## 2021-02-24 DIAGNOSIS — I739 Peripheral vascular disease, unspecified: Secondary | ICD-10-CM | POA: Diagnosis not present

## 2021-02-24 MED ORDER — APIXABAN 5 MG PO TABS: 5.0000 mg | ORAL_TABLET | Freq: Two times a day (BID) | ORAL | 0 refills | Status: DC

## 2021-02-24 MED ORDER — APIXABAN 2.5 MG PO TABS: 2.5000 mg | ORAL_TABLET | Freq: Two times a day (BID) | ORAL | Status: DC

## 2021-02-24 NOTE — Patient Instructions (Signed)
Medication Instructions:  Continue Eliquis 2.5 mg Twice daily  *If you need a refill on your cardiac medications before your next appointment, please call your pharmacy*  Testing/Procedures:  [employer] Cardiac Event Monitor Instructions Your physician has requested you wear your cardiac event monitor for 14 days, (1-30). [employer] may call or text to confirm a shipping address. The monitor will be sent to a land address via UPS. [employer] will not ship a monitor to a PO BOX. It typically takes 3-5 days to receive your monitor after it has been enrolled. [employer] will assist with USPS tracking if your package is delayed. The telephone number for [employer] is [phone]. Once you have received your monitor, please review the enclosed instructions. Instruction tutorials can also be viewed under help and settings on the enclosed cell phone. Your monitor has already been registered assigning a specific monitor serial # to you.  Applying the monitor Remove cell phone from case and turn it on. The cell phone works as Dealer and needs to be within [employer] of you at all times. The cell phone will need to be charged on a daily basis. We recommend you plug the cell phone into the enclosed charger at your bedside table every night.  Monitor batteries: You will receive two monitor batteries labelled #1 and #2. These are your recorders. Plug battery #2 onto the second connection on the enclosed charger. Keep one battery on the charger at all times. This will keep the monitor battery deactivated. It will also keep it fully charged for when you need to switch your monitor batteries. A small light will be blinking on the battery emblem when it is charging. The light on the battery emblem will remain on when the battery is fully charged.  Open package of a Monitor strip. Insert battery #1 into black hood on strip and gently squeeze monitor battery onto connection as indicated in  instruction booklet. Set aside while preparing skin.  Choose location for your strip, vertical or horizontal, as indicated in the instruction booklet. Shave to remove all hair from location. There cannot be any lotions, oils, powders, or colognes on skin where monitor is to be applied. Wipe skin clean with enclosed Saline wipe. Dry skin completely.  Peel paper labeled #1 off the back of the Monitor strip exposing the adhesive. Place the monitor on the chest in the vertical or horizontal position shown in the instruction booklet. One arrow on the monitor strip must be pointing upward. Carefully remove paper labeled #2, attaching remainder of strip to your skin. Try not to create any folds or wrinkles in the strip as you apply it.  Firmly press and release the circle in the center of the monitor battery. You will hear a small beep. This is turning the monitor battery on. The heart emblem on the monitor battery will light up every 5 seconds if the monitor battery in turned on and connected to the patient securely. Do not push and hold the circle down as this turns the monitor battery off. The cell phone will locate the monitor battery. A screen will appear on the cell phone checking the connection of your monitor strip. This may read poor connection initially but change to good connection within the next minute. Once your monitor accepts the connection you will hear a series of 3 beeps followed by a climbing crescendo of beeps. A screen will appear on the cell phone showing the two monitor strip placement options. Touch the picture that demonstrates  where you applied the monitor strip.  Your monitor strip and battery are waterproof. You are able to shower, bathe, or swim with the monitor on. They just ask you do not submerge deeper than 3 feet underwater. We recommend removing the monitor if you are swimming in a lake, river, or ocean.  Your monitor battery will need to be switched to a fully  charged monitor battery approximately once a week. The cell phone will alert you of an action which needs to be made.  On the cell phone, tap for details to reveal connection status, monitor battery status, and cell phone battery status. The green dots indicates your monitor is in good status. A red dot indicates there is something that needs your attention.  To record a symptom, click the circle on the monitor battery. In 30-60 seconds a list of symptoms will appear on the cell phone. Select your symptom and tap save. Your monitor will record a sustained or significant arrhythmia regardless of you clicking the button. Some patients do not feel the heart rhythm irregularities. [employer] will notify us of any serious or critical events.  Refer to instruction booklet for instructions on switching batteries, changing strips, the Do not disturb or Pause features, or any additional questions.  Call [employer] at [phone], to confirm your monitor is transmitting and record your baseline. They will answer any questions you may have regarding the monitor instructions at that time.  Returning the monitor to [place] all equipment back into blue box. Peel off strip of paper to expose adhesive and close box securely. There is a prepaid UPS shipping label on this box. Drop in a UPS drop box, or at a UPS facility like [employer]. You may also contact [employer] to arrange UPS to pick up monitor package at your home.  Follow-Up: At [facility], you and your health needs are our priority.  As part of our continuing mission to provide you with exceptional heart care, we have created designated Provider Care Teams.  These Care Teams include your primary Cardiologist (physician) and Advanced Practice Providers (APPs -  Physician Assistants and Nurse Practitioners) who all work together to provide you with the care you need, when you need it.  Your next appointment:   3 month(s)  The format  for your next appointment:   In Person  Provider:   Cherlynn Kaiser, MD

## 2021-02-24 NOTE — Progress Notes (Signed)
Patient ID: Jodi Cline, female   DOB: 1941-05-13, 80 y.o.   MRN: [id number] Patient enrolled for [employer] to ship a 14 day cardiac event monitor to address on file.

## 2021-03-02 NOTE — Addendum Note (Signed)
Addended by: Rexanne Mano B on: 03/02/2021 09:30 AM   Modules accepted: Orders

## 2021-03-03 ENCOUNTER — Ambulatory Visit (INDEPENDENT_AMBULATORY_CARE_PROVIDER_SITE_OTHER): Payer: Medicare HMO

## 2021-03-03 DIAGNOSIS — I4891 Unspecified atrial fibrillation: Secondary | ICD-10-CM

## 2021-03-15 DIAGNOSIS — J439 Emphysema, unspecified: Secondary | ICD-10-CM | POA: Diagnosis not present

## 2021-03-15 DIAGNOSIS — I480 Paroxysmal atrial fibrillation: Secondary | ICD-10-CM | POA: Diagnosis not present

## 2021-03-15 DIAGNOSIS — I10 Essential (primary) hypertension: Secondary | ICD-10-CM | POA: Diagnosis not present

## 2021-03-15 DIAGNOSIS — E7800 Pure hypercholesterolemia, unspecified: Secondary | ICD-10-CM | POA: Diagnosis not present

## 2021-03-15 DIAGNOSIS — M47816 Spondylosis without myelopathy or radiculopathy, lumbar region: Secondary | ICD-10-CM | POA: Diagnosis not present

## 2021-03-15 DIAGNOSIS — G4700 Insomnia, unspecified: Secondary | ICD-10-CM | POA: Diagnosis not present

## 2021-03-17 ENCOUNTER — Telehealth: Payer: Self-pay

## 2021-03-17 MED ORDER — APIXABAN 5 MG PO TABS
5.0000 mg | ORAL_TABLET | Freq: Two times a day (BID) | ORAL | 3 refills | Status: DC
Start: 2021-03-17 — End: 2021-05-30

## 2021-03-17 NOTE — Telephone Encounter (Signed)
Pt is returning call to [place], she has more questions. Please advise pt further

## 2021-03-17 NOTE — Addendum Note (Signed)
Addended by: Rexanne Mano B on: 03/17/2021 01:45 PM   Modules accepted: Orders

## 2021-03-17 NOTE — Addendum Note (Signed)
Addended by: Rexanne Mano B on: 03/17/2021 05:03 PM   Modules accepted: Orders

## 2021-03-17 NOTE — Telephone Encounter (Signed)
Strips reviewed by DOD (Dr. Gardiner Rhyme)- Per Dr. Gardiner Rhyme looks more like A- Fib.   Attempted to call patient with Dr. Delphina Cahill recommendations. Unable to reach patient, left message to call back to office when able.    Elouise Munroe, MD  You 15 minutes ago (11:45 AM)     Please stop cilostazol. Please increase eliquis to 5 mg BID. Please show strips to DOD for confirmation of atrial flutter.   GA

## 2021-03-17 NOTE — Telephone Encounter (Signed)
Returned call to patient to make her aware of Dr. Delphina Cahill recommendations regarding the run of Atrial Fib seen on patients cardiac event monitor. Patient states she will stop her Cilostazol- but does not wish to increase her Eliquis at this time. Patient states she is hesitant to increase her Eliquis based off of one strip. Patient states that she feels very well and denies any symptoms. Patient states that due to this does not want to make increase her Eliquis at this time.   Advised patient that I would forward message to Dr. Margaretann Loveless for her to review and advise. Patient verbalized understanding.   Made patient an OV on 08/26 as well which is next available to discuss concerns.

## 2021-03-17 NOTE — Telephone Encounter (Signed)
Returned call to patient, advised patient of the following from Dr. Margaretann Loveless:  Jodi Munroe, MD  You 44 minutes ago (4:09 PM)    She needs to increase the eliquis as she is otherwise underdosed and will not derive benefit from taking it if not at the appropriate dose. We only dose reduced because she was on pletal. At this point, we need to stop the pletal and keep the eliquis at appropriate dose.  GA    Patient would like to increase dosage of Eliquis to 5mg  Daily as she took time to think about this. Advised patient that 3 boxes of Samples are at front desk for pick up Eliquis 5mg  LOT: [id number] EXP: 06/24. Patient will stop Cilostazol, and will increase Eliquis. Patient's chart updated to reflect changes.   Advised patient to call back to office with any issues, questions, or concerns. Patient verbalized understanding.

## 2021-03-17 NOTE — Telephone Encounter (Signed)
Received Critical Results into Dr. Delphina Cahill mail box from [place] [facility]. Per report patient had Atrial Flutter with variable conduction on 7/14 at 6:57pm. Patients rate was 100 bpm .   Attempted to contact patient, unable to reach but left message (okay per DPR) for patient to return call to office when able to discuss.   Per chart review patient is currently taking Eliquis 2.5mg  BID  Strips reviewed by DOD as well.   Will route to Dr. Margaretann Loveless to make her aware and for any recommendations.

## 2021-03-17 NOTE — Telephone Encounter (Signed)
Patient returning call.

## 2021-03-22 NOTE — Telephone Encounter (Signed)
Pt called requesting monitor results Pt made aware that results was just uploaded today and nurse will call once reviewed by Dr. Margaretann Loveless.

## 2021-03-22 NOTE — Telephone Encounter (Signed)
     Pt would like to speak with Jodi Cline again, she asked if her complete result of her heart monitor is available

## 2021-03-25 NOTE — Telephone Encounter (Signed)
Jodi Cline called back due to still not receiving her heart monitor results. I advised her it appears they still have not been reviewed by Dr. Margaretann Loveless at this time, but the office will call her back in regards to it as soon as they can. Please advise.

## 2021-03-25 NOTE — Telephone Encounter (Signed)
Returned pt call, advised pt that her monitor had not yet been resulted by Dr. Margaretann Loveless. Advised pt that when results are available she will be contacted by office either by phone or mail. Pt verbalized understanding, all questions and concerns addressed at this time.

## 2021-03-28 ENCOUNTER — Other Ambulatory Visit: Payer: Self-pay

## 2021-04-04 DIAGNOSIS — H60502 Unspecified acute noninfective otitis externa, left ear: Secondary | ICD-10-CM | POA: Diagnosis not present

## 2021-04-07 ENCOUNTER — Telehealth: Payer: Self-pay | Admitting: Internal Medicine

## 2021-04-07 NOTE — Telephone Encounter (Signed)
Patient is calling to talk with a nurse

## 2021-04-07 NOTE — Telephone Encounter (Signed)
Received a call from patient stated she has been trying to call for monitor results.She is upset no one has called her.Advised Dr.Acharya'a RN tried to call her yesterday 8/10.She left a message for you to call back.Monitor results given.Advised to continue same medications.Keep appointment as planned with Dr.Acharya 8/26 at 8:00 am.

## 2021-04-07 NOTE — Telephone Encounter (Signed)
Returned call to patient, made patient aware of monitor results and reviewed these with her. Advised patient that I did call her to go over results but was unable to reach her so I left a message. Patient states that she attempted to return the call to me yesterday but was unable to reach me. Advised patient that I did not receive a message that she was calling me back yesterday and apologized for the inconvenience and issues with communication. Patient was very grateful for return call. Advised patient to call back to office with any issues, questions, or concerns. Patient verbalized understanding.

## 2021-04-21 NOTE — Progress Notes (Signed)
Cardiology Office Note:    Date:  02/24/2021   ID:  Jodi Cline, DOB Apr 26, 1941, MRN [id number]  PCP:  Lavone Orn, MD  Cardiologist:  Elouise Munroe, MD  Electrophysiologist:  None   Referring MD: Lavone Orn, MD   Chief Complaint/Reason for Referral: PAF, PAD, LBBB, HLD  History of Present Illness:    Jodi Cline is a 80 y.o. female with a history of hyperlipidemia, hypertension, squamous cell carcinomas and basal cell carcinomas of the skin, PAD, who presents today for follow up of paroxysmal atrial fibrillation.   Tolerating eliquis at full dose.  We reviewed the monitor results together which show a total of 4 hours in 14 days of atrial fibrillation.  We had a discussion with shared decision making and determined that maintaining anticoagulation for stroke prevention is the preferred strategy since she does not know when she is in atrial fibrillation.  She remains asymptomatic from a cardiac perspective.  She is having some increasing fatigue and is unsure if this is related to age but does not feel it is a medication effect.  She is also tolerating diltiazem well.  She notes that her bowel movements have become loose but have not increased in frequency and we discussed that this could be related to medication changes and she will monitor this and discuss it with Dr. Laurann Montana at her upcoming appointment in the next 1 to 2 months.  The patient denies chest pain, chest pressure, dyspnea at rest or with exertion, palpitations, PND, orthopnea, or leg swelling. Denies cough, fever, chills. Denies nausea, vomiting. Denies recurrent syncope or presyncope. Denies dizziness or lightheadedness.   Past Medical History:  Diagnosis Date   Anemia    Anxiety    Arthritis    BCC (basal cell carcinoma) 04/18/2005   Right mid thigh (CX35FU)   BCC (basal cell carcinoma) 04/07/2009   right shoulder blade (Cx3)   Depression    GERD (gastroesophageal reflux disease)    Hyperlipidemia     Hypertension    Insomnia    Left bundle branch block (LBBB) 08/2019   Nodular basal cell carcinoma (BCC) 08/05/2013   right shoulder blade (EXC)   PAD (peripheral artery disease) ([place])    Pneumonia    as a child   SCC (squamous cell carcinoma) 02/19/2001   left arm (CX35FU)   SCC (squamous cell carcinoma) 04/23/2013   left shin (txpbx)   SCC (squamous cell carcinoma) 08/05/2013   left shin (txpbx)   SCC (squamous cell carcinoma) 05/07/2014   Left shin (txpbx)   SCC (squamous cell carcinoma) 08/04/2014   left shin (MOHS)   Superficial basal cell carcinoma (BCC) 07/13/2015   right thigh (tx p bx)    Past Surgical History:  Procedure Laterality Date   CESAREAN SECTION     COLONOSCOPY     LUMBAR LAMINECTOMY/DECOMPRESSION MICRODISCECTOMY N/A 11/11/2020   Procedure: Mircolumbar revision decompression Lumbar four-five bilaterally, excision of synovial cyst;  Surgeon: Susa Day, MD;  Location: [place];  Service: Orthopedics;  Laterality: N/A;   THORACIC SPINE SURGERY     Dr. Tonita Cong   TUBAL LIGATION      Current Medications: Current Meds  Medication Sig   apixaban (ELIQUIS) 5 MG TABS tablet Take 1 tablet (5 mg total) by mouth 2 (two) times daily.   calcium carbonate (TUMS - DOSED IN MG ELEMENTAL CALCIUM) 500 MG chewable tablet Chew 500 mg by mouth daily as needed for indigestion.   cholecalciferol (VITAMIN D3) 25 MCG (  1000 UNIT) tablet Take 1,000 Units by mouth daily.   Cyanocobalamin (VITAMIN B 12 PO) Take 1 tablet by mouth daily.   docusate sodium (COLACE) 100 MG capsule Take 1 capsule (100 mg total) by mouth 2 (two) times daily as needed for mild constipation.   famotidine (PEPCID) 20 MG tablet Take 20 mg by mouth daily as needed for heartburn or indigestion.   oxybutynin (DITROPAN-XL) 5 MG 24 hr tablet Take 5 mg by mouth 2 (two) times daily.   polyethylene glycol (MIRALAX / GLYCOLAX) 17 g packet Take 17 g by mouth daily as needed for mild constipation.   Probiotic Product  (PROBIOTIC-10 PO) Take 1 tablet by mouth daily.   rosuvastatin (CRESTOR) 20 MG tablet Take 20 mg by mouth daily.   tretinoin (RETIN-A) 0.05 % cream Apply 1 application topically See admin instructions. Takes 2-3 times a week as needed for face.   valsartan-hydrochlorothiazide (DIOVAN-HCT) 160-12.5 MG tablet Take 1 tablet by mouth daily.   zolpidem (AMBIEN) 10 MG tablet Take 10 mg by mouth at bedtime.     Allergies:   Tramadol, Atorvastatin, Benazepril, Cephalexin, and Penicillins   Social History   Tobacco Use   Smoking status: Former   Smokeless tobacco: Never   Tobacco comments:    Quit at age 72 years  Vaping Use   Vaping Use: Never used  Substance Use Topics   Alcohol use: Yes    Alcohol/week: 14.0 standard drinks    Types: 14 Glasses of wine per week    Comment: 2 glasses of wine a day   Drug use: Never     Family History: The patient's family history includes Atrial fibrillation in her brother.  ROS:   Please see the history of present illness.    (+) Bilateral LE to the ankles  All other systems reviewed and are negative.  EKGs/Labs/Other Studies Reviewed:    The following studies were reviewed today: US Carotid Duplex 02/04/2021:  Right Carotid: Velocities in the right ICA are consistent with a 1-39% stenosis.The ECA appears <50% stenosed.   Left Carotid: Velocities in the left ICA are consistent with a 1-39% stenosis. Non-hemodynamically significant plaque <50% noted in the CCA. The ECA appears <50% stenosed.   Vertebrals:  Bilateral vertebral arteries demonstrate antegrade flow.  Subclavians: Normal flow hemodynamics were seen in bilateral subclavian arteries.  Echo 10/08/2019:  1. Left ventricular ejection fraction by 3D volume is 57 %. The left  ventricle has normal function. There is mildly increased concentric left  ventricular hypertrophy. Left ventricular diastolic parameters are  consistent with Grade I diastolic  dysfunction (impaired relaxation).    2. Right ventricular systolic function is normal. The right ventricular  size is normal. There is normal pulmonary artery systolic pressure.   3. The mitral valve is normal in structure and function. trivial mitral  valve regurgitation. No evidence of mitral stenosis.   4. The aortic valve is tricuspid. Aortic valve regurgitation is trivial .  Mild aortic valve sclerosis is present, with no evidence of aortic valve  stenosis.   5. Ascending aorta measures 38 mm at the proximal-mid ascending aorta.  This may be upper limit of normal for age and indexed to BSA.   6. The inferior vena cava is normal in size with greater than 50%  respiratory variability, suggesting right atrial pressure of 3 mmHg.   Comparison(s): No prior Echocardiogram.  US Abdominal Aorta 10/03/2019 :  Abdominal Aorta: No evidence of an abdominal aortic aneurysm was  visualized.  The largest aortic measurement is 1.8 cm.  Stenosis:  Mild to moderate wall calcification noted. No evidence of stenosis seen.   IVC/Iliac: There is no evidence of thrombus involving the IVC.     [place] LE Doppler 09/12/2019:  Right: Resting right ankle-brachial index is within normal range.  No evidence of significant right lower extremity arterial disease. The  right toe-brachial index is normal.   Left: Resting left ankle-brachial index is within normal range.  No evidence of significant left lower extremity arterial disease. The left  toe-brachial index is normal.   EKG: 04/22/2021: Normal sinus rhythm, left bundle branch block, QRS duration 146 ms 02/24/2021: Sinus rhythm, LBBB, HR 67 bpm, QRS 125m  11/30/2020: NSR, LBBB 09/26/2019: Normal sinus rhythm, LBBB, QRS duration 152 ms.    Recent Labs: 02/03/2021: B Natriuretic Peptide 56.2; TSH 6.639 02/04/2021: ALT 15; BUN 8; Creatinine, Ser 0.64; Hemoglobin 10.1; Magnesium 1.9; Platelets 147; Potassium 3.7; Sodium 139  Recent Lipid Panel    Component Value Date/Time   CHOL 228 (H)  09/30/2019 0950   TRIG 105 09/30/2019 0950   HDL 95 09/30/2019 0950   CHOLHDL 2.4 09/30/2019 0950   LDLCALC 115 (H) 09/30/2019 0950    Physical Exam:    VS:  BP 130/72 (BP Location: Left Arm, Patient Position: Sitting, Cuff Size: Normal)   Pulse 64   Ht 5' 6.5" (1.689 m)   Wt 155 lb 6.4 oz (70.5 kg)   SpO2 99%   BMI 24.71 kg/m     Wt Readings from Last 5 Encounters:  04/22/21 155 lb 6.4 oz (70.5 kg)  02/24/21 156 lb 12.8 oz (71.1 kg)  02/03/21 160 lb (72.6 kg)  11/30/20 158 lb 9.6 oz (71.9 kg)  11/11/20 159 lb (72.1 kg)    Constitutional: No acute distress Eyes: sclera non-icteric, normal conjunctiva and lids ENMT: normal dentition, moist mucous membranes Cardiovascular: regular rhythm, normal rate, no murmurs. S1 and S2 normal. Radial pulses normal bilaterally. No jugular venous distention.  Respiratory: clear to auscultation bilaterally GI : normal bowel sounds, soft and nontender. No distention.   MSK: extremities warm, well perfused. LE edema. + varicose veins bilaterally. NEURO: grossly nonfocal exam, moves all extremities. PSYCH: alert and oriented x 3, normal mood and affect.   ASSESSMENT:    1. Paroxysmal atrial fibrillation (HCC)   2. Secondary hypercoagulable state ([place]   3. PAD (peripheral artery disease) ([place]   4. LBBB (left bundle branch block)   5. Hyperlipidemia LDL goal <70   6. Essential hypertension      PLAN:    Paroxysmal atrial fibrillation ([place] - Plan: EKG 12-Lead Secondary hypercoagulable state ([place] - Plan: EKG 12-Lead -Continue Eliquis 5 mg twice daily and diltiazem 240 mg daily.  PAD (peripheral artery disease) ([place] - Plan: EKG 12-Lead -Overall stable off of cilostazol.  Continue aspirin and statin.  LBBB (left bundle branch block) - Plan: EKG 12-Lead -QRS duration mildly prolonged from previous but no worrisome signs or symptoms of change.  Hyperlipidemia LDL goal <70 - Plan: EKG 12-Lead -Continue Crestor 20 mg  daily.  Essential hypertension - Plan: EKG 12-Lead -Continue diltiazem and valsartan HCTZ.  Blood pressure overall stable today.  Total time of encounter: 30 minutes total time of encounter, including 17 minutes spent in face-to-face patient care on the date of this encounter. This time includes coordination of care and counseling regarding above mentioned problem list. Remainder of non-face-to-face time involved reviewing chart documents/testing relevant to the patient encounter and documentation  in the medical record. I have independently reviewed documentation from referring provider.   Cherlynn Kaiser, MD, [facility]     Medication Adjustments/Labs and Tests Ordered: Current medicines are reviewed at length with the patient today.  Concerns regarding medicines are outlined above.   Orders Placed This Encounter  Procedures   EKG 12-Lead    No orders of the defined types were placed in this encounter.    Patient Instructions  Medication Instructions:  No Changes In Medications at this time.  *If you need a refill on your cardiac medications before your next appointment, please call your pharmacy*  Follow-Up: At [facility], you and your health needs are our priority.  As part of our continuing mission to provide you with exceptional heart care, we have created designated Provider Care Teams.  These Care Teams include your primary Cardiologist (physician) and Advanced Practice Providers (APPs -  Physician Assistants and Nurse Practitioners) who all work together to provide you with the care you need, when you need it.  Your next appointment:   1 year(s)  The format for your next appointment:   In Person  Provider:   Cherlynn Kaiser, MD

## 2021-04-22 ENCOUNTER — Other Ambulatory Visit: Payer: Self-pay

## 2021-04-22 ENCOUNTER — Ambulatory Visit: Payer: Medicare HMO | Admitting: Internal Medicine

## 2021-04-22 VITALS — BP 130/72 | HR 64 | Ht 66.5 in | Wt 155.4 lb

## 2021-04-22 DIAGNOSIS — E785 Hyperlipidemia, unspecified: Secondary | ICD-10-CM | POA: Diagnosis not present

## 2021-04-22 DIAGNOSIS — Z79899 Other long term (current) drug therapy: Secondary | ICD-10-CM

## 2021-04-22 DIAGNOSIS — I739 Peripheral vascular disease, unspecified: Secondary | ICD-10-CM

## 2021-04-22 DIAGNOSIS — I10 Essential (primary) hypertension: Secondary | ICD-10-CM

## 2021-04-22 DIAGNOSIS — I447 Left bundle-branch block, unspecified: Secondary | ICD-10-CM

## 2021-04-22 DIAGNOSIS — I480 Paroxysmal atrial fibrillation: Secondary | ICD-10-CM | POA: Diagnosis not present

## 2021-04-22 DIAGNOSIS — D6869 Other thrombophilia: Secondary | ICD-10-CM | POA: Diagnosis not present

## 2021-04-22 NOTE — Patient Instructions (Signed)

## 2021-05-30 ENCOUNTER — Other Ambulatory Visit: Payer: Self-pay

## 2021-05-30 MED ORDER — APIXABAN 5 MG PO TABS: 5.0000 mg | ORAL_TABLET | Freq: Two times a day (BID) | ORAL | 1 refills | Status: DC

## 2021-05-30 NOTE — Telephone Encounter (Signed)
Prescription refill request for Eliquis received. Indication:afib Last office visit:acharya 04/22/21 Scr:0.64 02/04/21 Age: 59f Weight:70.5kg

## 2021-06-08 ENCOUNTER — Ambulatory Visit: Payer: Medicare HMO | Admitting: Internal Medicine

## 2021-06-23 ENCOUNTER — Telehealth: Payer: Self-pay | Admitting: Internal Medicine

## 2021-06-23 DIAGNOSIS — R058 Other specified cough: Secondary | ICD-10-CM | POA: Diagnosis not present

## 2021-06-23 DIAGNOSIS — Z03818 Encounter for observation for suspected exposure to other biological agents ruled out: Secondary | ICD-10-CM | POA: Diagnosis not present

## 2021-06-23 DIAGNOSIS — R197 Diarrhea, unspecified: Secondary | ICD-10-CM | POA: Diagnosis not present

## 2021-06-23 DIAGNOSIS — J029 Acute pharyngitis, unspecified: Secondary | ICD-10-CM | POA: Diagnosis not present

## 2021-06-23 DIAGNOSIS — H9202 Otalgia, left ear: Secondary | ICD-10-CM | POA: Diagnosis not present

## 2021-06-23 NOTE — Telephone Encounter (Signed)
Message routed to pharmacy team/MD to advise what is OK to take for pain given anticoagulant

## 2021-06-23 NOTE — Telephone Encounter (Signed)
Patient states she is returning call °

## 2021-06-23 NOTE — Telephone Encounter (Signed)
pt needs to speak with Dr. Delphina Cahill nurse... pt has had two teeth removed and is having a lot of discomfort.. pt called her dental office and was told to try ibuprofen to reduce the inflamation in that area.. pt would like to know if it is ok to take it  while taking Eliquis and if so what dosage.

## 2021-06-24 NOTE — Telephone Encounter (Signed)
One or two doses of ibu 400mg  would be ok. But after that patient should only take tylenol

## 2021-06-24 NOTE — Telephone Encounter (Signed)
Called patient left PharmD's advice on personal voice mail. 

## 2021-07-06 DIAGNOSIS — F5104 Psychophysiologic insomnia: Secondary | ICD-10-CM | POA: Diagnosis not present

## 2021-07-06 DIAGNOSIS — N3281 Overactive bladder: Secondary | ICD-10-CM | POA: Diagnosis not present

## 2021-07-06 DIAGNOSIS — I10 Essential (primary) hypertension: Secondary | ICD-10-CM | POA: Diagnosis not present

## 2021-07-06 DIAGNOSIS — D649 Anemia, unspecified: Secondary | ICD-10-CM | POA: Diagnosis not present

## 2021-07-06 DIAGNOSIS — K580 Irritable bowel syndrome with diarrhea: Secondary | ICD-10-CM | POA: Diagnosis not present

## 2021-07-06 DIAGNOSIS — Z1389 Encounter for screening for other disorder: Secondary | ICD-10-CM | POA: Diagnosis not present

## 2021-07-06 DIAGNOSIS — I480 Paroxysmal atrial fibrillation: Secondary | ICD-10-CM | POA: Diagnosis not present

## 2021-07-06 DIAGNOSIS — I700 Atherosclerosis of aorta: Secondary | ICD-10-CM | POA: Diagnosis not present

## 2021-07-06 DIAGNOSIS — I739 Peripheral vascular disease, unspecified: Secondary | ICD-10-CM | POA: Diagnosis not present

## 2021-07-06 DIAGNOSIS — Z0000 Encounter for general adult medical examination without abnormal findings: Secondary | ICD-10-CM | POA: Diagnosis not present

## 2021-07-06 DIAGNOSIS — E7800 Pure hypercholesterolemia, unspecified: Secondary | ICD-10-CM | POA: Diagnosis not present

## 2021-08-22 DIAGNOSIS — J0100 Acute maxillary sinusitis, unspecified: Secondary | ICD-10-CM | POA: Diagnosis not present

## 2021-09-07 DIAGNOSIS — Z23 Encounter for immunization: Secondary | ICD-10-CM | POA: Diagnosis not present

## 2021-09-07 DIAGNOSIS — Z5181 Encounter for therapeutic drug level monitoring: Secondary | ICD-10-CM | POA: Diagnosis not present

## 2021-09-07 DIAGNOSIS — E7800 Pure hypercholesterolemia, unspecified: Secondary | ICD-10-CM | POA: Diagnosis not present

## 2021-09-12 DIAGNOSIS — H10413 Chronic giant papillary conjunctivitis, bilateral: Secondary | ICD-10-CM | POA: Diagnosis not present

## 2021-10-13 ENCOUNTER — Ambulatory Visit: Payer: Medicare HMO | Admitting: Physician Assistant

## 2021-10-13 ENCOUNTER — Other Ambulatory Visit: Payer: Self-pay

## 2021-10-13 DIAGNOSIS — Z85828 Personal history of other malignant neoplasm of skin: Secondary | ICD-10-CM | POA: Diagnosis not present

## 2021-10-13 DIAGNOSIS — D485 Neoplasm of uncertain behavior of skin: Secondary | ICD-10-CM

## 2021-10-13 DIAGNOSIS — D0430 Carcinoma in situ of skin of unspecified part of face: Secondary | ICD-10-CM

## 2021-10-13 DIAGNOSIS — Z1283 Encounter for screening for malignant neoplasm of skin: Secondary | ICD-10-CM | POA: Diagnosis not present

## 2021-10-13 DIAGNOSIS — L570 Actinic keratosis: Secondary | ICD-10-CM | POA: Diagnosis not present

## 2021-10-13 DIAGNOSIS — D045 Carcinoma in situ of skin of trunk: Secondary | ICD-10-CM

## 2021-10-13 DIAGNOSIS — D0439 Carcinoma in situ of skin of other parts of face: Secondary | ICD-10-CM | POA: Diagnosis not present

## 2021-10-13 MED ORDER — KETOCONAZOLE 2 % EX SHAM: 1.0000 "application " | MEDICATED_SHAMPOO | Freq: Once | CUTANEOUS | 6 refills | Status: AC

## 2021-10-19 ENCOUNTER — Telehealth: Payer: Self-pay | Admitting: *Deleted

## 2021-10-19 NOTE — Telephone Encounter (Signed)
Path to patient. Patient has a 6 month follow up.

## 2021-10-19 NOTE — Telephone Encounter (Signed)
-----   Message from Lavonna Monarch, MD sent at 10/19/2021 10:23 AM EST ----- 6 month follow up

## 2021-10-20 DIAGNOSIS — N898 Other specified noninflammatory disorders of vagina: Secondary | ICD-10-CM | POA: Diagnosis not present

## 2021-10-20 DIAGNOSIS — N3001 Acute cystitis with hematuria: Secondary | ICD-10-CM | POA: Diagnosis not present

## 2021-10-24 DIAGNOSIS — N390 Urinary tract infection, site not specified: Secondary | ICD-10-CM | POA: Diagnosis not present

## 2021-10-24 DIAGNOSIS — N898 Other specified noninflammatory disorders of vagina: Secondary | ICD-10-CM | POA: Diagnosis not present

## 2021-10-25 DIAGNOSIS — H40053 Ocular hypertension, bilateral: Secondary | ICD-10-CM | POA: Diagnosis not present

## 2021-10-25 DIAGNOSIS — H353131 Nonexudative age-related macular degeneration, bilateral, early dry stage: Secondary | ICD-10-CM | POA: Diagnosis not present

## 2021-10-25 DIAGNOSIS — H18513 Endothelial corneal dystrophy, bilateral: Secondary | ICD-10-CM | POA: Diagnosis not present

## 2021-10-25 DIAGNOSIS — H5203 Hypermetropia, bilateral: Secondary | ICD-10-CM | POA: Diagnosis not present

## 2021-10-31 DIAGNOSIS — N898 Other specified noninflammatory disorders of vagina: Secondary | ICD-10-CM | POA: Diagnosis not present

## 2021-11-04 ENCOUNTER — Encounter: Payer: Self-pay | Admitting: Physician Assistant

## 2021-11-04 NOTE — Progress Notes (Signed)
Follow-Up Visit   Subjective  Jodi Cline is a 81 y.o. female who presents for the following: Annual Exam (Here for skin exam. No concerns. History of non mole skin cancer. ).   The following portions of the chart were reviewed this encounter and updated as appropriate:  Tobacco   Allergies   Meds   Problems   Med Hx   Surg Hx   Fam Hx       Objective  Well appearing patient in no apparent distress; mood and affect are within normal limits.  A full examination was performed including scalp, head, eyes, ears, nose, lips, neck, chest, axillae, abdomen, back, buttocks, bilateral upper extremities, bilateral lower extremities, hands, feet, fingers, toes, fingernails, and toenails. All findings within normal limits unless otherwise noted below.  Right Lower Leg - Anterior     Chest - Medial (Center) Hyperkeratotic scale with pink base            Left Zygomatic Area Hyperkeratotic scale with pink base         Assessment & Plan  Lichenoid keratosis Right Lower Leg - Anterior  Observe for changes 1.1 for size of lesion   AK (actinic keratosis) Left Breast  Destruction of lesion - Left Breast Complexity: simple   Destruction method: cryotherapy   Informed consent: discussed and consent obtained   Timeout:  patient name, date of birth, surgical site, and procedure verified Lesion destroyed using liquid nitrogen: Yes   Cryotherapy cycles:  3 Outcome: patient tolerated procedure well with no complications   Post-procedure details: wound care instructions given    Carcinoma in situ of skin of trunk Chest - Medial (Center)  Skin / nail biopsy Type of biopsy: tangential   Informed consent: discussed and consent obtained   Timeout: patient name, date of birth, surgical site, and procedure verified   Anesthesia: the lesion was anesthetized in a standard fashion   Anesthetic:  1% lidocaine w/ epinephrine 1-100,000 local infiltration Instrument used: flexible  razor blade   Hemostasis achieved with: aluminum chloride and electrodesiccation   Outcome: patient tolerated procedure well   Post-procedure details: wound care instructions given    Destruction of lesion Complexity: simple   Destruction method: electrodesiccation and curettage   Informed consent: discussed and consent obtained   Timeout:  patient name, date of birth, surgical site, and procedure verified Anesthesia: the lesion was anesthetized in a standard fashion   Anesthetic:  1% lidocaine w/ epinephrine 1-100,000 local infiltration Curettage performed in three different directions: Yes   Electrodesiccation performed over the curetted area: Yes   Curettage cycles:  3 Margin per side (cm):  0.1 Final wound size (cm):  1.1 Hemostasis achieved with:  aluminum chloride Outcome: patient tolerated procedure well with no complications   Post-procedure details: wound care instructions given    Specimen 1 - Surgical pathology Differential Diagnosis: bcc vs scc txpbx  Check Margins: No  Carcinoma in situ of skin of face, unspecified location Left Zygomatic Area  Skin / nail biopsy Type of biopsy: tangential   Informed consent: discussed and consent obtained   Timeout: patient name, date of birth, surgical site, and procedure verified   Anesthesia: the lesion was anesthetized in a standard fashion   Anesthetic:  1% lidocaine w/ epinephrine 1-100,000 local infiltration Instrument used: flexible razor blade   Hemostasis achieved with: aluminum chloride and electrodesiccation   Outcome: patient tolerated procedure well   Post-procedure details: wound care instructions given    Destruction of  lesion Complexity: simple   Destruction method: electrodesiccation and curettage   Informed consent: discussed and consent obtained   Timeout:  patient name, date of birth, surgical site, and procedure verified Anesthesia: the lesion was anesthetized in a standard fashion   Anesthetic:  1%  lidocaine w/ epinephrine 1-100,000 local infiltration Curettage performed in three different directions: Yes   Electrodesiccation performed over the curetted area: Yes   Curettage cycles:  3 Margin per side (cm):  0.1 Final wound size (cm):  1.3 Hemostasis achieved with:  aluminum chloride Outcome: patient tolerated procedure well with no complications   Post-procedure details: wound care instructions given    Specimen 2 - Surgical pathology Differential Diagnosis: bcc vs scc txpbx  Check Margins: No    I, Lachelle Rissler, PA-C, have reviewed all documentation's for this visit.  The documentation on 11/04/21 for the exam, diagnosis, procedures and orders are all accurate and complete.

## 2021-11-25 DIAGNOSIS — I10 Essential (primary) hypertension: Secondary | ICD-10-CM | POA: Diagnosis not present

## 2021-11-25 DIAGNOSIS — E7800 Pure hypercholesterolemia, unspecified: Secondary | ICD-10-CM | POA: Diagnosis not present

## 2021-12-03 ENCOUNTER — Other Ambulatory Visit: Payer: Self-pay | Admitting: Internal Medicine

## 2021-12-07 ENCOUNTER — Telehealth: Payer: Self-pay | Admitting: Internal Medicine

## 2021-12-07 MED ORDER — APIXABAN 5 MG PO TABS: 5.0000 mg | ORAL_TABLET | Freq: Two times a day (BID) | ORAL | 0 refills | Status: DC

## 2021-12-07 NOTE — Telephone Encounter (Signed)
Prescription refill request for Eliquis received. ?Indication:Afib ?Last office visit:8/22 ?Scr:0.6 ?Age: 81 ?Weight:70.5 kg ? ?Prescription refilled ? ?

## 2021-12-07 NOTE — Telephone Encounter (Signed)
?*  STAT* If patient is at the pharmacy, call can be transferred to refill team. ? ? ?1. Which medications need to be refilled? (please list name of each medication and dose if known)  Eliquis ? ?2. Which pharmacy/location (including street and city if local pharmacy) is medication to be sent to? [facility] ? ? they need a 30 day or 90 day supply? 90 days and refills ? ?

## 2021-12-07 NOTE — Telephone Encounter (Signed)
?*  STAT* If patient is at the pharmacy, call can be transferred to refill team. ? ? ?1. Which medications need to be refilled? (please list name of each medication and dose if known) new prescriptions to local pharmacy for Eliquis- need this until her mail order arrives ? ?2. Which pharmacy/location (including street and city if local pharmacy) is medication to be sent to? [facility] [address] ? ?3. Do they need a 30 day or 90 day supply? 10 days and # 20 enough until her mail order arrives- need this asap- only 1 day left ? ?

## 2021-12-14 ENCOUNTER — Telehealth: Payer: Self-pay | Admitting: Internal Medicine

## 2021-12-14 NOTE — Telephone Encounter (Signed)
Pt c/o medication issue: ? ?1. Name of Medication: apixaban (ELIQUIS) 5 MG TABS tablet ? ?2. How are you currently taking this medication (dosage and times per day)? Take 1 tablet (5 mg total) by mouth 2 (two) times daily. ? ?3. Are you having a reaction (difficulty breathing--STAT)? no ? ?4. What is your medication issue? Patient calling to see if she is suppose still talking this medication. Because [facility] told her it stop. Please advise ? ?

## 2021-12-14 NOTE — Telephone Encounter (Signed)
Left message on name-verified VM that she should continue taking eliquis as prescribed, med was refilled on 4/12 to local pharmacy, and if she needs refilled to [place], please call to let us know.  ?

## 2021-12-15 ENCOUNTER — Other Ambulatory Visit: Payer: Self-pay | Admitting: Internal Medicine

## 2021-12-16 ENCOUNTER — Other Ambulatory Visit: Payer: Self-pay | Admitting: Internal Medicine

## 2021-12-19 ENCOUNTER — Telehealth: Payer: Self-pay | Admitting: Internal Medicine

## 2021-12-19 DIAGNOSIS — I10 Essential (primary) hypertension: Secondary | ICD-10-CM | POA: Diagnosis not present

## 2021-12-19 DIAGNOSIS — I480 Paroxysmal atrial fibrillation: Secondary | ICD-10-CM | POA: Diagnosis not present

## 2021-12-19 DIAGNOSIS — G4700 Insomnia, unspecified: Secondary | ICD-10-CM | POA: Diagnosis not present

## 2021-12-19 DIAGNOSIS — E7800 Pure hypercholesterolemia, unspecified: Secondary | ICD-10-CM | POA: Diagnosis not present

## 2021-12-19 MED ORDER — APIXABAN 5 MG PO TABS: 5.0000 mg | ORAL_TABLET | Freq: Two times a day (BID) | ORAL | 1 refills | Status: DC

## 2021-12-19 NOTE — Telephone Encounter (Signed)
Prescription refill request for Eliquis received. ?Indication:Afib ?Last office visit:8/22 ?Scr:0.6 ?Age: 81 ?Weight:70.5 kg ? ?Prescription refilled ? ?

## 2021-12-19 NOTE — Telephone Encounter (Signed)
?*  STAT* If patient is at the pharmacy, call can be transferred to refill team. ? ? ?1. Which medications need to be refilled? (please list name of each medication and dose if known) apixaban (ELIQUIS) 5 MG TABS tablet ? ?2. Which pharmacy/location (including street and city if local pharmacy) is medication to be sent to? [id number] - , [place] [address] ? ?3. Do they need a 30 day or 90 day supply? 90 ? ? ?Patient is out of medication the last refill was only for 20 days. ? ?

## 2021-12-26 ENCOUNTER — Encounter (HOSPITAL_BASED_OUTPATIENT_CLINIC_OR_DEPARTMENT_OTHER): Payer: Self-pay | Admitting: Obstetrics & Gynecology

## 2021-12-26 ENCOUNTER — Ambulatory Visit (HOSPITAL_BASED_OUTPATIENT_CLINIC_OR_DEPARTMENT_OTHER): Payer: Medicare HMO | Admitting: Obstetrics & Gynecology

## 2021-12-26 ENCOUNTER — Other Ambulatory Visit (HOSPITAL_COMMUNITY)
Admission: RE | Admit: 2021-12-26 | Discharge: 2021-12-26 | Disposition: A | Discharge status: Home / Self Care | Payer: Medicare HMO | Source: Ambulatory Visit | Attending: Obstetrics & Gynecology | Admitting: Obstetrics & Gynecology

## 2021-12-26 VITALS — BP 122/64 | Ht 65.0 in | Wt 158.0 lb

## 2021-12-26 DIAGNOSIS — Z124 Encounter for screening for malignant neoplasm of cervix: Secondary | ICD-10-CM

## 2021-12-26 DIAGNOSIS — N3281 Overactive bladder: Secondary | ICD-10-CM

## 2021-12-26 DIAGNOSIS — N952 Postmenopausal atrophic vaginitis: Secondary | ICD-10-CM

## 2021-12-26 DIAGNOSIS — N898 Other specified noninflammatory disorders of vagina: Secondary | ICD-10-CM | POA: Insufficient documentation

## 2021-12-26 DIAGNOSIS — N939 Abnormal uterine and vaginal bleeding, unspecified: Secondary | ICD-10-CM

## 2021-12-26 MED ORDER — SOLIFENACIN SUCCINATE 5 MG PO TABS: 5.0000 mg | ORAL_TABLET | Freq: Every day | ORAL | 4 refills | Status: DC

## 2021-12-26 NOTE — Progress Notes (Signed)
GYNECOLOGY  VISIT ? ?CC:   Second opinion ? ?HPI: ?81 y.o. G3P2 Married White or Caucasian female here for desired second opinion about evaluation and possible treatment for vaginal discharge and spotting.  Pt reports she saw provider at [facility].  Ultrasound as performed showing 74m endometrium.  Pt reports exams are very difficult and painful.  Has not has any treatment for vaginal atrophy.  She reports surgery was recommended to r/o mass.  Reports she never been told there was a mass.  Just didn't feel comfortable with explanation.  I do not have her ultrasound images but do have copy report.  Uterus normal.  Ovaries not visualized.  Endometrium 391m   ? ?Also H/O OAB.  Does have some urinary leakage.  Does wear a pad during the day.  She does get up at night about once a night.  Not sure vesicare is working for her.  However, upon asking about symptoms prior to starting vesicare.  She was getting up 3-4 times nightly and used to have to void about every 2 hours during the day.  Denies any side effects with medication.  Upon reflection, feels this is actually helping her.  Does need refill. ? ? ?Patient Active Problem List  ? Diagnosis Date Noted  ? Syncope 02/04/2021  ? Hypokalemia 02/04/2021  ? Sepsis ([facility]06/05/2021  ? Acute cystitis 02/04/2021  ? Urinary incontinence 02/04/2021  ? Atrial fibrillation with RVR (HCRoundup06/04/2021  ? Spinal stenosis at L4-L5 level 11/11/2020  ? Synovial cyst of lumbar facet joint 06/03/2020  ? Greater trochanteric pain syndrome of left lower extremity 10/31/2017  ? Retrocalcaneal bursitis (back of heel), left 10/31/2017  ? Benign essential HTN 09/27/2017  ? Benign neoplasm of colon 09/27/2017  ? Cannot sleep 09/27/2017  ? Constipation by outlet dysfunction 09/27/2017  ? Hematuria syndrome 09/27/2017  ? HLD (hyperlipidemia) 09/27/2017  ? Radiculitis 09/27/2017  ? ? ?Past Medical History:  ?Diagnosis Date  ? Anemia   ? Anxiety   ? Arthritis   ? BCC (basal cell carcinoma)  04/18/2005  ? Right mid thigh (CX35FU)  ? BCC (basal cell carcinoma) 04/07/2009  ? right shoulder blade (Cx3)  ? Depression   ? GERD (gastroesophageal reflux disease)   ? Hyperlipidemia   ? Hypertension   ? Insomnia   ? Left bundle branch block (LBBB) 08/2019  ? Nodular basal cell carcinoma (BCC) 08/05/2013  ? right shoulder blade (EXC)  ? PAD (peripheral artery disease) (HCFloris  ? Pneumonia   ? as a child  ? SCC (squamous cell carcinoma) 02/19/2001  ? left arm (CX35FU)  ? SCC (squamous cell carcinoma) 04/23/2013  ? left shin (txpbx)  ? SCC (squamous cell carcinoma) 08/05/2013  ? left shin (txpbx)  ? SCC (squamous cell carcinoma) 05/07/2014  ? Left shin (txpbx)  ? SCC (squamous cell carcinoma) 08/04/2014  ? left shin (MOHS)  ? Superficial basal cell carcinoma (BCC) 07/13/2015  ? right thigh (tx p bx)  ? ? ?Past Surgical History:  ?Procedure Laterality Date  ? CESAREAN SECTION    ? COLONOSCOPY    ? LUMBAR LAMINECTOMY/DECOMPRESSION MICRODISCECTOMY N/A 11/11/2020  ? Procedure: Mircolumbar revision decompression Lumbar four-five bilaterally, excision of synovial cyst;  Surgeon: BeSusa DayMD;  [facility]: Orthopedics;  Laterality: N/A;  ? THORACIC SPINE SURGERY    ? Dr. BeTonita Cong? TUBAL LIGATION    ? ? ?MEDS:   ?Current Outpatient Medications on File Prior to Visit  ?Medication Sig  Dispense Refill  ? acetaminophen (TYLENOL) 500 MG tablet 1 tablet as needed    ? apixaban (ELIQUIS) 5 MG TABS tablet Take 1 tablet (5 mg total) by mouth 2 (two) times daily. 180 tablet 1  ? calcium carbonate (TUMS - DOSED IN MG ELEMENTAL CALCIUM) 500 MG chewable tablet Chew 500 mg by mouth daily as needed for indigestion.    ? cholecalciferol (VITAMIN D3) 25 MCG (1000 UNIT) tablet Take 1,000 Units by mouth daily.    ? Cyanocobalamin (VITAMIN B 12 PO) Take 1 tablet by mouth daily.    ? famotidine (PEPCID) 20 MG tablet Take 20 mg by mouth daily as needed for heartburn or indigestion.    ? polyethylene glycol (MIRALAX / GLYCOLAX)  17 g packet Take 17 g by mouth daily as needed for mild constipation. 30 packet 1  ? Probiotic Product (PROBIOTIC-10 PO) Take 1 tablet by mouth daily.    ? rosuvastatin (CRESTOR) 20 MG tablet Take 20 mg by mouth daily.    ? solifenacin (VESICARE) 5 MG tablet Take 5 mg by mouth daily.    ? tretinoin (RETIN-A) 0.05 % cream Apply 1 application topically See admin instructions. Takes 2-3 times a week as needed for face. 45 g 0  ? valsartan-hydrochlorothiazide (DIOVAN-HCT) 160-12.5 MG tablet Take 1 tablet by mouth daily.    ? zolpidem (AMBIEN) 10 MG tablet Take 10 mg by mouth at bedtime.    ? [DISCONTINUED] amLODipine (NORVASC) 10 MG tablet Take 1 tablet by mouth daily.    ? ?No current facility-administered medications on file prior to visit.  ? ? ?ALLERGIES: Tramadol, Atorvastatin, Benazepril, Cephalexin, and Penicillins ? ?Family History  ?Problem Relation Age of Onset  ? Atrial fibrillation Brother   ? ? ?SH:  married, non smoker ? ?Review of Systems  ?Genitourinary:   ?     Urinary urgency ?No dysuria  ? ?PHYSICAL EXAMINATION:   ? ?BP 122/64 (BP Location: Left Arm, Patient Position: Sitting, Cuff Size: Small)   Ht '5\' 5"'$  (1.651 m)   Wt 158 lb (71.7 kg)   BMI 26.29 kg/m?     ?General appearance: alert, cooperative and appears stated age ?Abdomen: soft, non-tender; bowel sounds normal; no masses,  no organomegaly ?Lymph:  no inguinal LAD noted ? ?Pelvic: External genitalia:  no lesions ?             Urethra:  normal appearing urethra with no masses, tenderness or lesions ?             Bartholins and Skenes: normal    ?             Vagina: atrophic vaginal tissue, pink noted with manipulation of speculum, no lesions ?             Cervix: no lesions ?             Bimanual Exam:  Uterus:  normal size, contour, position, consistency, mobility, non-tender ?             Adnexa: no mass, fullness, tenderness ? ?Chaperone, Britt Bottom, CMA, was present for exam. ? ?Assessment/Plan: ?1. Vaginal discharge ?- will test for  vaginitis ?- Cervicovaginal ancillary only( [place]) ? ?2. Vaginal spotting in PMP female ?- endometrium 47m on ultrasound.  Spottting/pink discharge has stopped ? ?3. Vaginal atrophy ?- pt may benefit from Vit E vaginal suppositories or other vaginal moisturizing agent ? ?4. Cervical cancer screening ?- Cytology - PAP( [place]) ?- PR OBTAINING SCREEN  PAP SMEAR ? ?5.  OAB ?- upon discussing, feel she is actually having improvement with vesicare without side effects so feel this has been a good option for her.  Rf for 90 day supply with additional RFs completed today. ? ? ?

## 2021-12-27 LAB — CERVICOVAGINAL ANCILLARY ONLY
Bacterial Vaginitis (gardnerella): NEGATIVE
Candida Glabrata: NEGATIVE
Candida Vaginitis: NEGATIVE
Comment: NEGATIVE
Comment: NEGATIVE
Comment: NEGATIVE

## 2021-12-27 LAB — CYTOLOGY - PAP: Diagnosis: NEGATIVE

## 2022-01-03 ENCOUNTER — Encounter (HOSPITAL_BASED_OUTPATIENT_CLINIC_OR_DEPARTMENT_OTHER): Payer: Self-pay | Admitting: *Deleted

## 2022-01-03 ENCOUNTER — Other Ambulatory Visit: Payer: Self-pay | Admitting: Physician Assistant

## 2022-01-06 ENCOUNTER — Telehealth: Payer: Self-pay | Admitting: Internal Medicine

## 2022-01-06 ENCOUNTER — Other Ambulatory Visit: Payer: Self-pay | Admitting: Physician Assistant

## 2022-01-06 NOTE — Telephone Encounter (Signed)
Pt requesting provider switch from Dr Margaretann Loveless to Dr [name], because she would like to be seen at the [place] location due to it being closer.  ?

## 2022-01-06 NOTE — Telephone Encounter (Signed)
OK by me 

## 2022-01-09 ENCOUNTER — Telehealth: Payer: Self-pay | Admitting: *Deleted

## 2022-01-09 MED ORDER — TRETINOIN 0.05 % EX CREA: 1.0000 "application " | TOPICAL_CREAM | CUTANEOUS | 3 refills | Status: AC

## 2022-01-09 NOTE — Telephone Encounter (Signed)
Tretinoin cream refill sent to [facility].  ?

## 2022-01-11 DIAGNOSIS — I10 Essential (primary) hypertension: Secondary | ICD-10-CM | POA: Diagnosis not present

## 2022-01-11 DIAGNOSIS — N3281 Overactive bladder: Secondary | ICD-10-CM | POA: Diagnosis not present

## 2022-02-22 DIAGNOSIS — E7800 Pure hypercholesterolemia, unspecified: Secondary | ICD-10-CM | POA: Diagnosis not present

## 2022-02-22 DIAGNOSIS — Z5181 Encounter for therapeutic drug level monitoring: Secondary | ICD-10-CM | POA: Diagnosis not present

## 2022-03-09 DIAGNOSIS — E7800 Pure hypercholesterolemia, unspecified: Secondary | ICD-10-CM | POA: Diagnosis not present

## 2022-03-09 DIAGNOSIS — N390 Urinary tract infection, site not specified: Secondary | ICD-10-CM | POA: Diagnosis not present

## 2022-03-28 ENCOUNTER — Telehealth: Payer: Self-pay | Admitting: Physician Assistant

## 2022-03-28 NOTE — Telephone Encounter (Signed)
Patient requested phone call from [facility], [place].

## 2022-04-10 ENCOUNTER — Encounter (HOSPITAL_BASED_OUTPATIENT_CLINIC_OR_DEPARTMENT_OTHER): Payer: Self-pay | Admitting: *Deleted

## 2022-04-10 DIAGNOSIS — Z8719 Personal history of other diseases of the digestive system: Secondary | ICD-10-CM | POA: Insufficient documentation

## 2022-04-10 DIAGNOSIS — I700 Atherosclerosis of aorta: Secondary | ICD-10-CM | POA: Insufficient documentation

## 2022-04-10 DIAGNOSIS — I671 Cerebral aneurysm, nonruptured: Secondary | ICD-10-CM | POA: Insufficient documentation

## 2022-04-10 DIAGNOSIS — D692 Other nonthrombocytopenic purpura: Secondary | ICD-10-CM | POA: Insufficient documentation

## 2022-04-10 DIAGNOSIS — Z860100 Personal history of colon polyps, unspecified: Secondary | ICD-10-CM | POA: Insufficient documentation

## 2022-04-10 DIAGNOSIS — M5030 Other cervical disc degeneration, unspecified cervical region: Secondary | ICD-10-CM | POA: Insufficient documentation

## 2022-04-10 DIAGNOSIS — I739 Peripheral vascular disease, unspecified: Secondary | ICD-10-CM | POA: Insufficient documentation

## 2022-04-10 DIAGNOSIS — N898 Other specified noninflammatory disorders of vagina: Secondary | ICD-10-CM | POA: Insufficient documentation

## 2022-04-10 DIAGNOSIS — K580 Irritable bowel syndrome with diarrhea: Secondary | ICD-10-CM | POA: Insufficient documentation

## 2022-04-10 DIAGNOSIS — K648 Other hemorrhoids: Secondary | ICD-10-CM | POA: Insufficient documentation

## 2022-04-10 DIAGNOSIS — J439 Emphysema, unspecified: Secondary | ICD-10-CM | POA: Insufficient documentation

## 2022-04-10 DIAGNOSIS — R911 Solitary pulmonary nodule: Secondary | ICD-10-CM | POA: Insufficient documentation

## 2022-04-10 DIAGNOSIS — E7800 Pure hypercholesterolemia, unspecified: Secondary | ICD-10-CM | POA: Diagnosis not present

## 2022-04-10 DIAGNOSIS — N3281 Overactive bladder: Secondary | ICD-10-CM | POA: Insufficient documentation

## 2022-04-10 DIAGNOSIS — M47816 Spondylosis without myelopathy or radiculopathy, lumbar region: Secondary | ICD-10-CM | POA: Insufficient documentation

## 2022-04-12 ENCOUNTER — Ambulatory Visit: Payer: Medicare HMO | Admitting: Physician Assistant

## 2022-04-17 ENCOUNTER — Ambulatory Visit (HOSPITAL_BASED_OUTPATIENT_CLINIC_OR_DEPARTMENT_OTHER): Payer: Medicare HMO | Admitting: Cardiology

## 2022-04-17 ENCOUNTER — Encounter (HOSPITAL_BASED_OUTPATIENT_CLINIC_OR_DEPARTMENT_OTHER): Payer: Self-pay | Admitting: Cardiology

## 2022-04-17 VITALS — BP 138/82 | HR 67 | Ht 65.0 in | Wt 158.0 lb

## 2022-04-17 DIAGNOSIS — Z7901 Long term (current) use of anticoagulants: Secondary | ICD-10-CM | POA: Diagnosis not present

## 2022-04-17 DIAGNOSIS — I10 Essential (primary) hypertension: Secondary | ICD-10-CM | POA: Diagnosis not present

## 2022-04-17 DIAGNOSIS — I480 Paroxysmal atrial fibrillation: Secondary | ICD-10-CM | POA: Diagnosis not present

## 2022-04-17 DIAGNOSIS — D6869 Other thrombophilia: Secondary | ICD-10-CM | POA: Diagnosis not present

## 2022-04-17 DIAGNOSIS — Z7189 Other specified counseling: Secondary | ICD-10-CM | POA: Diagnosis not present

## 2022-04-17 DIAGNOSIS — I447 Left bundle-branch block, unspecified: Secondary | ICD-10-CM

## 2022-04-17 DIAGNOSIS — E7800 Pure hypercholesterolemia, unspecified: Secondary | ICD-10-CM | POA: Diagnosis not present

## 2022-04-17 NOTE — Progress Notes (Signed)
Cardiology Office Note:    Date:  04/17/2022   ID:  MERCADIES CO, DOB 07-11-1941, MRN [id number]  PCP:  Lavone Orn, MD  Cardiologist:  Buford Dresser, MD  Referring MD: Lavone Orn, MD   CC: new patient to me/transfer of care  History of Present Illness:    Jodi Cline is a 81 y.o. female with a hx of paroxysmal atrial fibrillation, LBBB, anemia, hypertension, hyperlipidemia, GERD, basal cell carcinoma, and squamous cell carcinoma, who is seen for follow-up.   Previously followed by Dr. Margaretann Loveless, last seen by her on 04/22/2021 for follow-up of paroxysmal atrial fibrillation. She was tolerating Eliquis and diltiazem. Her 14-day monitor showed a total of 4 hours of atrial fibrillation. After shared decision making she was maintained on anticoagulation for stroke prevention since she does not know when she is in Afib; she was asymptomatic from a cardiac perspective. She noted some increasing fatigue, but was unsure if it was related to age. Continued Eliquis 5 mg twice daily and diltiazem 240 mg daily.  Generally she is feeling okay. Typically she is not able to tell when she is in atrial fibrillation.  Cardiovascular risk factors: Prior clinical ASCVD: None. Comorbid conditions: Hypertension - recently taken off of amlodipine. Hyperlipidemia - recently increased her rosuvastatin to 20 mg.   Family history: She has 3 brothers who all have atrial fibrillation with earlier onset. Prior cardiac testing and/or incidental findings on other testing (ie coronary calcium): Exercise level: She endorses ongoing tingling sensations in her bilateral feet, more like pins and needles. Her tingling/pain is worse on uneven ground. This has limited her walking distance and activity such as gardening; she has tried multiple kinds of shoes with limited success in alleviating her symptoms. Previously on gabapentin which was ineffective.  Sometimes she feels a little "uneven"; she is not sure if this  is attributable to her feet discomfort.  Remains compliant with Eliquis.  She denies any palpitations, chest pain, shortness of breath, or peripheral edema. No lightheadedness, headaches, syncope, orthopnea, or PND.  Past Medical History:  Diagnosis Date   Anemia    Anxiety    Arthritis    BCC (basal cell carcinoma) 04/18/2005   Right mid thigh (CX35FU)   BCC (basal cell carcinoma) 04/07/2009   right shoulder blade (Cx3)   Depression    GERD (gastroesophageal reflux disease)    Hyperlipidemia    Hypertension    Insomnia    Left bundle branch block (LBBB) 08/2019   Nodular basal cell carcinoma (BCC) 08/05/2013   right shoulder blade (EXC)   PAD (peripheral artery disease) (HCC)    Paroxysmal atrial fibrillation (HCC)    Pneumonia    as a child   SCC (squamous cell carcinoma) 02/19/2001   left arm (CX35FU)   SCC (squamous cell carcinoma) 04/23/2013   left shin (txpbx)   SCC (squamous cell carcinoma) 08/05/2013   left shin (txpbx)   SCC (squamous cell carcinoma) 05/07/2014   Left shin (txpbx)   SCC (squamous cell carcinoma) 08/04/2014   left shin (MOHS)   Superficial basal cell carcinoma (BCC) 07/13/2015   right thigh (tx p bx)    Past Surgical History:  Procedure Laterality Date   CESAREAN SECTION     LUMBAR LAMINECTOMY/DECOMPRESSION MICRODISCECTOMY N/A 11/11/2020   Procedure: Mircolumbar revision decompression Lumbar four-five bilaterally, excision of synovial cyst;  Surgeon: Susa Day, MD;  Location: [place];  Service: Orthopedics;  Laterality: N/A;   THORACIC SPINE SURGERY  1999   Dr.  Beane   TUBAL LIGATION      Current Medications: Current Outpatient Medications on File Prior to Visit  Medication Sig   acetaminophen (TYLENOL) 500 MG tablet 1 tablet as needed   apixaban (ELIQUIS) 5 MG TABS tablet Take 1 tablet (5 mg total) by mouth 2 (two) times daily.   calcium carbonate (TUMS - DOSED IN MG ELEMENTAL CALCIUM) 500 MG chewable tablet Chew 500 mg by mouth  daily as needed for indigestion.   cholecalciferol (VITAMIN D3) 25 MCG (1000 UNIT) tablet Take 1,000 Units by mouth daily.   Cyanocobalamin (VITAMIN B 12 PO) Take 1 tablet by mouth daily.   diltiazem (CARDIZEM CD) 240 MG 24 hr capsule Take 240 mg by mouth daily.   famotidine (PEPCID) 20 MG tablet Take 20 mg by mouth daily as needed for heartburn or indigestion.   polyethylene glycol (MIRALAX / GLYCOLAX) 17 g packet Take 17 g by mouth daily as needed for mild constipation.   Probiotic Product (PROBIOTIC-10 PO) Take 1 tablet by mouth daily.   rosuvastatin (CRESTOR) 20 MG tablet Take 20 mg by mouth daily.   solifenacin (VESICARE) 5 MG tablet Take 1 tablet (5 mg total) by mouth daily.   valsartan-hydrochlorothiazide (DIOVAN-HCT) 160-12.5 MG tablet Take 1 tablet by mouth daily.   zolpidem (AMBIEN) 10 MG tablet Take 10 mg by mouth at bedtime.   No current facility-administered medications on file prior to visit.     Allergies:   Tramadol, Atorvastatin, Benazepril, Cephalexin, and Penicillins   Social History   Tobacco Use   Smoking status: Former   Smokeless tobacco: Never   Tobacco comments:    Quit at age 35 years  Vaping Use   Vaping Use: Never used  Substance Use Topics   Alcohol use: Yes    Alcohol/week: 14.0 standard drinks of alcohol    Types: 14 Glasses of wine per week    Comment: 2 glasses of wine a day   Drug use: Never    Family History: family history includes Atrial fibrillation in her brother.  ROS:   Please see the history of present illness.  Additional pertinent ROS: Constitutional: Negative for chills, fever, night sweats, unintentional weight loss  HENT: Negative for ear pain and hearing loss.   Eyes: Negative for loss of vision and eye pain.  Respiratory: Negative for cough, sputum, wheezing.   Cardiovascular: See HPI. Gastrointestinal: Negative for abdominal pain, melena, and hematochezia.  Genitourinary: Negative for dysuria and hematuria.   Musculoskeletal: Negative for falls. Positive for bilateral foot pain.  Skin: Negative for itching and rash.  Neurological: Negative for focal weakness, and loss of consciousness. Positive for tingling sensations in bilateral feet.  Endo/Heme/Allergies: Does not bruise/bleed easily.     EKGs/Labs/Other Studies Reviewed:    The following studies were reviewed today:  Monitor  02/2021: Indication: Afib   Minimum HR (bpm): 57 Maximum HR (bpm): 143   Supraventricular Ectopy: Rare <1% SVT: none   Ventricular Ectopy: <1% rare NSVT: none Ventricular Tachycardia: none   Pauses: none AV block: none   Atrial fibrillation: Atrial fibrillation and atrial flutter detected. 4 total hours. Fastest rate 143 bpm.    Diary events: none   IMPRESSION: Atrial fibrillation and atrial flutter detected on monitor.   Bilateral Carotid Dopplers  02/04/2021: Summary:  Right Carotid: Velocities in the right ICA are consistent with a 1-39%  stenosis.                 The ECA appears <50%  stenosed.   Left Carotid: Velocities in the left ICA are consistent with a 1-39%  stenosis.                Non-hemodynamically significant plaque <50% noted in the  CCA. The                ECA appears <50% stenosed.   Vertebrals:  Bilateral vertebral arteries demonstrate antegrade flow.  Subclavians: Normal flow hemodynamics were seen in bilateral subclavian arteries.   CT Chest  01/13/2021: IMPRESSION: 1. Stable 4 mm right apical pulmonary nodule. Stability of greater than 2 years is consistent with benign etiology. 2. No new or progressive pulmonary nodules. No acute intrathoracic abnormality. 3. Emphysema. 4. Aortic atherosclerosis.  Coronary artery calcifications.   Aortic Atherosclerosis (ICD10-I70.0) and Emphysema (ICD10-J43.9).  Echo  10/08/2019:  1. Left ventricular ejection fraction by 3D volume is 57 %. The left  ventricle has normal function. There is mildly increased concentric left   ventricular hypertrophy. Left ventricular diastolic parameters are  consistent with Grade I diastolic  dysfunction (impaired relaxation).   2. Right ventricular systolic function is normal. The right ventricular  size is normal. There is normal pulmonary artery systolic pressure.   3. The mitral valve is normal in structure and function. trivial mitral  valve regurgitation. No evidence of mitral stenosis.   4. The aortic valve is tricuspid. Aortic valve regurgitation is trivial .  Mild aortic valve sclerosis is present, with no evidence of aortic valve  stenosis.   5. Ascending aorta measures 38 mm at the proximal-mid ascending aorta.  This may be upper limit of normal for age and indexed to BSA.   6. The inferior vena cava is normal in size with greater than 50%  respiratory variability, suggesting right atrial pressure of 3 mmHg.   Comparison(s): No prior Echocardiogram.   Abdominal Aorta Study  10/03/2019: Summary:  Abdominal Aorta: No evidence of an abdominal aortic aneurysm was  visualized. The largest aortic measurement is 1.8 cm.  Stenosis:  Mild to moderate wall calcification noted. No evidence of stenosis seen.   IVC/Iliac: There is no evidence of thrombus involving the IVC.    EKG:  EKG is personally reviewed.   04/17/2022:  SR with 1st degree AV block, LBBB, QRS 144 ms 04/22/2021 (Dr. Margaretann Loveless): Normal sinus rhythm, LBBB, QRS duration 146 ms  Recent Labs: No results found for requested labs within last 365 days.   Recent Lipid Panel    Component Value Date/Time   CHOL 228 (H) 09/30/2019 0950   TRIG 105 09/30/2019 0950   HDL 95 09/30/2019 0950   CHOLHDL 2.4 09/30/2019 0950   LDLCALC 115 (H) 09/30/2019 0950    Physical Exam:    VS:  BP 138/82   Pulse 67   Ht '5\' 5"'$  (1.651 m)   Wt 158 lb (71.7 kg)   BMI 26.29 kg/m     Wt Readings from Last 3 Encounters:  04/17/22 158 lb (71.7 kg)  12/26/21 158 lb (71.7 kg)  04/22/21 155 lb 6.4 oz (70.5 kg)    GEN: Well  nourished, well developed in no acute distress HEENT: Normal, moist mucous membranes NECK: No JVD CARDIAC: regular rhythm, normal S1 and S2, no rubs or gallops. No murmur. VASCULAR: Radial and DP pulses 2+ bilaterally. No carotid bruits RESPIRATORY:  Clear to auscultation without rales, wheezing or rhonchi  ABDOMEN: Soft, non-tender, non-distended MUSCULOSKELETAL:  Ambulates independently SKIN: Warm and dry, no edema NEUROLOGIC:  Alert and oriented x  3. No focal neuro deficits noted. PSYCHIATRIC:  Normal affect    ASSESSMENT:    1. Paroxysmal atrial fibrillation (HCC)   2. Long term current use of anticoagulant   3. Secondary hypercoagulable state ([place])   4. LBBB (left bundle branch block)   5. Hypercholesterolemia   6. Essential hypertension   7. Counseling on health promotion and disease prevention    PLAN:    Paroxysmal atrial fibrillation LBBB -CHA2DS2/VAS Stroke Risk Points= 4  -continue apixaban 5 mg BID -continue diltiazem -in sinus today -LBBB stable  Hypertension -continue diltiazem, valsartan-HCTZ  Hypercholesterolemia -continue rosuvastatin 20 mg daily, last LDL 96  Cardiac risk counseling and prevention recommendations: -recommend heart healthy/Mediterranean diet, with whole grains, fruits, vegetable, fish, lean meats, nuts, and olive oil. Limit salt. -recommend moderate walking, 3-5 times/week for 30-50 minutes each session. Aim for at least 150 minutes.week. Goal should be pace of 3 miles/hours, or walking 1.5 miles in 30 minutes -recommend avoidance of tobacco products. Avoid excess alcohol.  Plan for follow up: 1 year or sooner as needed.  Buford Dresser, MD, PhD, [facility]    Medication Adjustments/Labs and Tests Ordered: Current medicines are reviewed at length with the patient today.  Concerns regarding medicines are outlined above.   Orders Placed This Encounter  Procedures   EKG 12-Lead   No orders of the  defined types were placed in this encounter.  Patient Instructions  Medication Instructions:  Your Physician recommend you continue on your current medication as directed.    *If you need a refill on your cardiac medications before your next appointment, please call your pharmacy*   Lab Work: None ordered today   Testing/Procedures: None ordered today   Follow-Up: At [facility], you and your health needs are our priority.  As part of our continuing mission to provide you with exceptional heart care, we have created designated Provider Care Teams.  These Care Teams include your primary Cardiologist (physician) and Advanced Practice Providers (APPs -  Physician Assistants and Nurse Practitioners) who all work together to provide you with the care you need, when you need it.  We recommend signing up for the patient portal called "MyChart".  Sign up information is provided on this After Visit Summary.  MyChart is used to connect with patients for Virtual Visits (Telemedicine).  Patients are able to view lab/test results, encounter notes, upcoming appointments, etc.  Non-urgent messages can be sent to your provider as well.   To learn more about what you can do with MyChart, go to [email or web address].    Your next appointment:   1 year(s)  The format for your next appointment:   In Person  Provider:   Buford Dresser, MD{         I,Mathew Stumpf,acting as a scribe for Buford Dresser, MD.,have documented all relevant documentation on the behalf of Buford Dresser, MD,as directed by  Buford Dresser, MD while in the presence of Buford Dresser, MD.  I, Buford Dresser, MD, have reviewed all documentation for this visit. The documentation on 04/17/22 for the exam, diagnosis, procedures, and orders are all accurate and complete.   Signed, Buford Dresser, MD PhD 04/17/2022 1:09 PM    [place]

## 2022-04-17 NOTE — Patient Instructions (Signed)

## 2022-04-18 DIAGNOSIS — E7800 Pure hypercholesterolemia, unspecified: Secondary | ICD-10-CM | POA: Diagnosis not present

## 2022-04-18 DIAGNOSIS — I480 Paroxysmal atrial fibrillation: Secondary | ICD-10-CM | POA: Diagnosis not present

## 2022-04-18 DIAGNOSIS — I10 Essential (primary) hypertension: Secondary | ICD-10-CM | POA: Diagnosis not present

## 2022-04-25 DIAGNOSIS — S51011A Laceration without foreign body of right elbow, initial encounter: Secondary | ICD-10-CM | POA: Diagnosis not present

## 2022-05-23 DIAGNOSIS — W19XXXA Unspecified fall, initial encounter: Secondary | ICD-10-CM | POA: Diagnosis not present

## 2022-05-23 DIAGNOSIS — R2681 Unsteadiness on feet: Secondary | ICD-10-CM | POA: Diagnosis not present

## 2022-05-23 DIAGNOSIS — S8012XA Contusion of left lower leg, initial encounter: Secondary | ICD-10-CM | POA: Diagnosis not present

## 2022-06-06 ENCOUNTER — Other Ambulatory Visit: Payer: Self-pay | Admitting: Internal Medicine

## 2022-06-06 ENCOUNTER — Telehealth (HOSPITAL_BASED_OUTPATIENT_CLINIC_OR_DEPARTMENT_OTHER): Payer: Self-pay | Admitting: Cardiology

## 2022-06-06 DIAGNOSIS — I10 Essential (primary) hypertension: Secondary | ICD-10-CM

## 2022-06-06 DIAGNOSIS — Z7901 Long term (current) use of anticoagulants: Secondary | ICD-10-CM

## 2022-06-06 DIAGNOSIS — I480 Paroxysmal atrial fibrillation: Secondary | ICD-10-CM

## 2022-06-06 NOTE — Telephone Encounter (Signed)
Caller stated patient will need assistance getting Eliquis and patient would like to get samples of this medication.

## 2022-06-06 NOTE — Telephone Encounter (Signed)
Eliquis 5 mg #1 Lot # [id number] exp 4/25 at front for pick up Advised patient only 1 box of samples but will have at front Lab orders for updated BMET/CBC with bag, patient aware needs to get done Patient assistance forms with sample

## 2022-06-07 DIAGNOSIS — I10 Essential (primary) hypertension: Secondary | ICD-10-CM | POA: Diagnosis not present

## 2022-06-07 DIAGNOSIS — Z7901 Long term (current) use of anticoagulants: Secondary | ICD-10-CM | POA: Diagnosis not present

## 2022-06-07 DIAGNOSIS — M6281 Muscle weakness (generalized): Secondary | ICD-10-CM | POA: Diagnosis not present

## 2022-06-07 DIAGNOSIS — R296 Repeated falls: Secondary | ICD-10-CM | POA: Diagnosis not present

## 2022-06-07 DIAGNOSIS — R2689 Other abnormalities of gait and mobility: Secondary | ICD-10-CM | POA: Diagnosis not present

## 2022-06-07 DIAGNOSIS — I480 Paroxysmal atrial fibrillation: Secondary | ICD-10-CM | POA: Diagnosis not present

## 2022-06-08 LAB — CBC WITH DIFFERENTIAL/PLATELET
Basophils Absolute: 0 10*3/uL (ref 0.0–0.2)
Basos: 1 %
EOS (ABSOLUTE): 0.1 10*3/uL (ref 0.0–0.4)
Eos: 2 %
Hematocrit: 39.1 % (ref 34.0–46.6)
Hemoglobin: 13.1 g/dL (ref 11.1–15.9)
Immature Grans (Abs): 0 10*3/uL (ref 0.0–0.1)
Immature Granulocytes: 0 %
Lymphocytes Absolute: 2 10*3/uL (ref 0.7–3.1)
Lymphs: 29 %
MCH: 31.6 pg (ref 26.6–33.0)
MCHC: 33.5 g/dL (ref 31.5–35.7)
MCV: 94 fL (ref 79–97)
Monocytes Absolute: 0.6 10*3/uL (ref 0.1–0.9)
Monocytes: 9 %
Neutrophils Absolute: 4.2 10*3/uL (ref 1.4–7.0)
Neutrophils: 59 %
RBC: 4.15 x10E6/uL (ref 3.77–5.28)
RDW: 13 % (ref 11.7–15.4)
WBC: 6.9 10*3/uL (ref 3.4–10.8)

## 2022-06-08 LAB — BASIC METABOLIC PANEL
BUN/Creatinine Ratio: 15 (ref 12–28)
BUN: 13 mg/dL (ref 8–27)
CO2: 24 mmol/L (ref 20–29)
Calcium: 9.7 mg/dL (ref 8.7–10.3)
Chloride: 100 mmol/L (ref 96–106)
Creatinine, Ser: 0.87 mg/dL (ref 0.57–1.00)
Glucose: 97 mg/dL (ref 70–99)
Potassium: 4.5 mmol/L (ref 3.5–5.2)
Sodium: 137 mmol/L (ref 134–144)
eGFR: 67 mL/min/{1.73_m2} (ref >59)

## 2022-06-14 ENCOUNTER — Telehealth (HOSPITAL_BASED_OUTPATIENT_CLINIC_OR_DEPARTMENT_OTHER): Payer: Self-pay | Admitting: Cardiology

## 2022-06-14 MED ORDER — APIXABAN 5 MG PO TABS: 5.0000 mg | ORAL_TABLET | Freq: Two times a day (BID) | ORAL | 1 refills | Status: DC

## 2022-06-14 NOTE — Telephone Encounter (Signed)
Prescription refill request for Eliquis received. Indication:Afib  Last office visit:04/17/22 [name])  Scr: 0.87 (06/07/22)  Age: 81 Weight: 71.7kg  Appropriate dose and refill sent to requested pharmacy.

## 2022-06-14 NOTE — Telephone Encounter (Signed)
*  STAT* If patient is at the pharmacy, call can be transferred to refill team.   1. Which medications need to be refilled? (please list name of each medication and dose if known)   apixaban (ELIQUIS) 5 MG TABS tablet    2. Which pharmacy/location (including street and city if local pharmacy) is medication to be sent to? [id number] - [place], [place] - [address] AT [address]   3. Do they need a 30 day or 90 day supply? 90  

## 2022-06-15 ENCOUNTER — Telehealth: Payer: Self-pay | Admitting: Cardiology

## 2022-06-15 NOTE — Telephone Encounter (Signed)
Pt c/o medication issue:  1. Name of Medication: apixaban (ELIQUIS) 5 MG TABS tablet  2. How are you currently taking this medication (dosage and times per day)? Take 1 tablet (5 mg total) by mouth 2 (two) times daily. - Oral  3. Are you having a reaction (difficulty breathing--STAT)?   4. What is your medication issue? Pt states this medication is $450 and wants to know if her patient assistance forms were approved

## 2022-06-15 NOTE — Telephone Encounter (Signed)
Spoke with patient and advised Dr Judeth Cornfield nurse out of the office today but would follow up when she returns

## 2022-06-16 NOTE — Telephone Encounter (Signed)
Called pt and informed we received a fax from [facility] stating pt was denied due to not meeting the 3% out of pocket expense.

## 2022-06-16 NOTE — Telephone Encounter (Signed)
Per Jodi Cline received fax showing she didn't provide the 3% out of pocket   Left message to call back

## 2022-06-19 DIAGNOSIS — M6281 Muscle weakness (generalized): Secondary | ICD-10-CM | POA: Diagnosis not present

## 2022-06-19 DIAGNOSIS — R2689 Other abnormalities of gait and mobility: Secondary | ICD-10-CM | POA: Diagnosis not present

## 2022-06-19 DIAGNOSIS — R296 Repeated falls: Secondary | ICD-10-CM | POA: Diagnosis not present

## 2022-06-21 DIAGNOSIS — R296 Repeated falls: Secondary | ICD-10-CM | POA: Diagnosis not present

## 2022-06-21 DIAGNOSIS — M6281 Muscle weakness (generalized): Secondary | ICD-10-CM | POA: Diagnosis not present

## 2022-06-21 DIAGNOSIS — R2689 Other abnormalities of gait and mobility: Secondary | ICD-10-CM | POA: Diagnosis not present

## 2022-06-26 DIAGNOSIS — R296 Repeated falls: Secondary | ICD-10-CM | POA: Diagnosis not present

## 2022-06-26 DIAGNOSIS — M6281 Muscle weakness (generalized): Secondary | ICD-10-CM | POA: Diagnosis not present

## 2022-06-26 DIAGNOSIS — R2689 Other abnormalities of gait and mobility: Secondary | ICD-10-CM | POA: Diagnosis not present

## 2022-06-30 DIAGNOSIS — R2689 Other abnormalities of gait and mobility: Secondary | ICD-10-CM | POA: Diagnosis not present

## 2022-06-30 DIAGNOSIS — M6281 Muscle weakness (generalized): Secondary | ICD-10-CM | POA: Diagnosis not present

## 2022-06-30 DIAGNOSIS — R296 Repeated falls: Secondary | ICD-10-CM | POA: Diagnosis not present

## 2022-07-03 DIAGNOSIS — M6281 Muscle weakness (generalized): Secondary | ICD-10-CM | POA: Diagnosis not present

## 2022-07-03 DIAGNOSIS — R296 Repeated falls: Secondary | ICD-10-CM | POA: Diagnosis not present

## 2022-07-03 DIAGNOSIS — R2689 Other abnormalities of gait and mobility: Secondary | ICD-10-CM | POA: Diagnosis not present

## 2022-07-05 DIAGNOSIS — R2689 Other abnormalities of gait and mobility: Secondary | ICD-10-CM | POA: Diagnosis not present

## 2022-07-05 DIAGNOSIS — R296 Repeated falls: Secondary | ICD-10-CM | POA: Diagnosis not present

## 2022-07-05 DIAGNOSIS — M6281 Muscle weakness (generalized): Secondary | ICD-10-CM | POA: Diagnosis not present

## 2022-07-10 DIAGNOSIS — I480 Paroxysmal atrial fibrillation: Secondary | ICD-10-CM | POA: Diagnosis not present

## 2022-07-10 DIAGNOSIS — N3281 Overactive bladder: Secondary | ICD-10-CM | POA: Diagnosis not present

## 2022-07-10 DIAGNOSIS — Z0000 Encounter for general adult medical examination without abnormal findings: Secondary | ICD-10-CM | POA: Diagnosis not present

## 2022-07-10 DIAGNOSIS — Z1331 Encounter for screening for depression: Secondary | ICD-10-CM | POA: Diagnosis not present

## 2022-07-10 DIAGNOSIS — E7800 Pure hypercholesterolemia, unspecified: Secondary | ICD-10-CM | POA: Diagnosis not present

## 2022-07-10 DIAGNOSIS — I739 Peripheral vascular disease, unspecified: Secondary | ICD-10-CM | POA: Diagnosis not present

## 2022-07-10 DIAGNOSIS — R911 Solitary pulmonary nodule: Secondary | ICD-10-CM | POA: Diagnosis not present

## 2022-07-10 DIAGNOSIS — J439 Emphysema, unspecified: Secondary | ICD-10-CM | POA: Diagnosis not present

## 2022-07-10 DIAGNOSIS — I10 Essential (primary) hypertension: Secondary | ICD-10-CM | POA: Diagnosis not present

## 2022-07-10 DIAGNOSIS — Z23 Encounter for immunization: Secondary | ICD-10-CM | POA: Diagnosis not present

## 2022-07-10 DIAGNOSIS — I700 Atherosclerosis of aorta: Secondary | ICD-10-CM | POA: Diagnosis not present

## 2022-07-12 DIAGNOSIS — M6281 Muscle weakness (generalized): Secondary | ICD-10-CM | POA: Diagnosis not present

## 2022-07-12 DIAGNOSIS — R2689 Other abnormalities of gait and mobility: Secondary | ICD-10-CM | POA: Diagnosis not present

## 2022-07-12 DIAGNOSIS — R296 Repeated falls: Secondary | ICD-10-CM | POA: Diagnosis not present

## 2022-07-14 DIAGNOSIS — M6281 Muscle weakness (generalized): Secondary | ICD-10-CM | POA: Diagnosis not present

## 2022-07-14 DIAGNOSIS — R2689 Other abnormalities of gait and mobility: Secondary | ICD-10-CM | POA: Diagnosis not present

## 2022-07-14 DIAGNOSIS — R296 Repeated falls: Secondary | ICD-10-CM | POA: Diagnosis not present

## 2022-07-17 DIAGNOSIS — R296 Repeated falls: Secondary | ICD-10-CM | POA: Diagnosis not present

## 2022-07-17 DIAGNOSIS — R2689 Other abnormalities of gait and mobility: Secondary | ICD-10-CM | POA: Diagnosis not present

## 2022-07-17 DIAGNOSIS — M6281 Muscle weakness (generalized): Secondary | ICD-10-CM | POA: Diagnosis not present

## 2022-07-24 DIAGNOSIS — M6281 Muscle weakness (generalized): Secondary | ICD-10-CM | POA: Diagnosis not present

## 2022-07-24 DIAGNOSIS — R2689 Other abnormalities of gait and mobility: Secondary | ICD-10-CM | POA: Diagnosis not present

## 2022-07-24 DIAGNOSIS — R296 Repeated falls: Secondary | ICD-10-CM | POA: Diagnosis not present

## 2022-07-26 DIAGNOSIS — R296 Repeated falls: Secondary | ICD-10-CM | POA: Diagnosis not present

## 2022-07-26 DIAGNOSIS — R2689 Other abnormalities of gait and mobility: Secondary | ICD-10-CM | POA: Diagnosis not present

## 2022-07-26 DIAGNOSIS — M6281 Muscle weakness (generalized): Secondary | ICD-10-CM | POA: Diagnosis not present

## 2022-07-31 DIAGNOSIS — M6281 Muscle weakness (generalized): Secondary | ICD-10-CM | POA: Diagnosis not present

## 2022-07-31 DIAGNOSIS — R2689 Other abnormalities of gait and mobility: Secondary | ICD-10-CM | POA: Diagnosis not present

## 2022-07-31 DIAGNOSIS — R296 Repeated falls: Secondary | ICD-10-CM | POA: Diagnosis not present

## 2022-08-02 DIAGNOSIS — M6281 Muscle weakness (generalized): Secondary | ICD-10-CM | POA: Diagnosis not present

## 2022-08-02 DIAGNOSIS — R2689 Other abnormalities of gait and mobility: Secondary | ICD-10-CM | POA: Diagnosis not present

## 2022-08-02 DIAGNOSIS — R296 Repeated falls: Secondary | ICD-10-CM | POA: Diagnosis not present

## 2022-08-07 DIAGNOSIS — M6281 Muscle weakness (generalized): Secondary | ICD-10-CM | POA: Diagnosis not present

## 2022-08-07 DIAGNOSIS — R2689 Other abnormalities of gait and mobility: Secondary | ICD-10-CM | POA: Diagnosis not present

## 2022-08-07 DIAGNOSIS — R296 Repeated falls: Secondary | ICD-10-CM | POA: Diagnosis not present

## 2022-08-09 DIAGNOSIS — R2689 Other abnormalities of gait and mobility: Secondary | ICD-10-CM | POA: Diagnosis not present

## 2022-08-09 DIAGNOSIS — R296 Repeated falls: Secondary | ICD-10-CM | POA: Diagnosis not present

## 2022-08-09 DIAGNOSIS — M6281 Muscle weakness (generalized): Secondary | ICD-10-CM | POA: Diagnosis not present

## 2022-09-07 DIAGNOSIS — L570 Actinic keratosis: Secondary | ICD-10-CM | POA: Diagnosis not present

## 2022-09-07 DIAGNOSIS — L089 Local infection of the skin and subcutaneous tissue, unspecified: Secondary | ICD-10-CM | POA: Diagnosis not present

## 2022-09-07 DIAGNOSIS — D0472 Carcinoma in situ of skin of left lower limb, including hip: Secondary | ICD-10-CM | POA: Diagnosis not present

## 2022-09-07 DIAGNOSIS — L820 Inflamed seborrheic keratosis: Secondary | ICD-10-CM | POA: Diagnosis not present

## 2022-09-07 DIAGNOSIS — D485 Neoplasm of uncertain behavior of skin: Secondary | ICD-10-CM | POA: Diagnosis not present

## 2022-09-07 DIAGNOSIS — C44629 Squamous cell carcinoma of skin of left upper limb, including shoulder: Secondary | ICD-10-CM | POA: Diagnosis not present

## 2022-09-07 DIAGNOSIS — C44319 Basal cell carcinoma of skin of other parts of face: Secondary | ICD-10-CM | POA: Diagnosis not present

## 2022-09-14 DIAGNOSIS — L089 Local infection of the skin and subcutaneous tissue, unspecified: Secondary | ICD-10-CM | POA: Diagnosis not present

## 2022-09-14 DIAGNOSIS — Z5189 Encounter for other specified aftercare: Secondary | ICD-10-CM | POA: Diagnosis not present

## 2022-09-22 DIAGNOSIS — I872 Venous insufficiency (chronic) (peripheral): Secondary | ICD-10-CM | POA: Diagnosis not present

## 2022-09-22 DIAGNOSIS — R234 Changes in skin texture: Secondary | ICD-10-CM | POA: Diagnosis not present

## 2022-09-22 DIAGNOSIS — D099 Carcinoma in situ, unspecified: Secondary | ICD-10-CM | POA: Diagnosis not present

## 2022-09-22 DIAGNOSIS — M25473 Effusion, unspecified ankle: Secondary | ICD-10-CM | POA: Diagnosis not present

## 2022-09-26 DIAGNOSIS — C44319 Basal cell carcinoma of skin of other parts of face: Secondary | ICD-10-CM | POA: Diagnosis not present

## 2022-10-12 DIAGNOSIS — C44629 Squamous cell carcinoma of skin of left upper limb, including shoulder: Secondary | ICD-10-CM | POA: Diagnosis not present

## 2022-10-12 DIAGNOSIS — C4492 Squamous cell carcinoma of skin, unspecified: Secondary | ICD-10-CM | POA: Diagnosis not present

## 2022-10-12 DIAGNOSIS — L905 Scar conditions and fibrosis of skin: Secondary | ICD-10-CM | POA: Diagnosis not present

## 2022-10-16 DIAGNOSIS — R300 Dysuria: Secondary | ICD-10-CM | POA: Diagnosis not present

## 2022-10-30 DIAGNOSIS — R300 Dysuria: Secondary | ICD-10-CM | POA: Diagnosis not present

## 2022-10-31 DIAGNOSIS — H5203 Hypermetropia, bilateral: Secondary | ICD-10-CM | POA: Diagnosis not present

## 2022-10-31 DIAGNOSIS — Z5189 Encounter for other specified aftercare: Secondary | ICD-10-CM | POA: Diagnosis not present

## 2022-10-31 DIAGNOSIS — H40013 Open angle with borderline findings, low risk, bilateral: Secondary | ICD-10-CM | POA: Diagnosis not present

## 2022-10-31 DIAGNOSIS — H353131 Nonexudative age-related macular degeneration, bilateral, early dry stage: Secondary | ICD-10-CM | POA: Diagnosis not present

## 2022-10-31 DIAGNOSIS — C44729 Squamous cell carcinoma of skin of left lower limb, including hip: Secondary | ICD-10-CM | POA: Diagnosis not present

## 2022-10-31 DIAGNOSIS — H40053 Ocular hypertension, bilateral: Secondary | ICD-10-CM | POA: Diagnosis not present

## 2022-10-31 DIAGNOSIS — Z961 Presence of intraocular lens: Secondary | ICD-10-CM | POA: Diagnosis not present

## 2022-10-31 DIAGNOSIS — H18513 Endothelial corneal dystrophy, bilateral: Secondary | ICD-10-CM | POA: Diagnosis not present

## 2022-11-14 DIAGNOSIS — C44729 Squamous cell carcinoma of skin of left lower limb, including hip: Secondary | ICD-10-CM | POA: Diagnosis not present

## 2022-12-12 DIAGNOSIS — I872 Venous insufficiency (chronic) (peripheral): Secondary | ICD-10-CM | POA: Diagnosis not present

## 2022-12-19 DIAGNOSIS — Z5189 Encounter for other specified aftercare: Secondary | ICD-10-CM | POA: Diagnosis not present

## 2022-12-19 DIAGNOSIS — Z85828 Personal history of other malignant neoplasm of skin: Secondary | ICD-10-CM | POA: Diagnosis not present

## 2022-12-20 ENCOUNTER — Other Ambulatory Visit: Payer: Self-pay | Admitting: Physician Assistant

## 2022-12-20 ENCOUNTER — Ambulatory Visit
Admission: RE | Admit: 2022-12-20 | Discharge: 2022-12-20 | Disposition: A | Discharge status: Home / Self Care | Payer: Medicare HMO | Source: Ambulatory Visit | Attending: Physician Assistant | Admitting: Physician Assistant

## 2022-12-20 DIAGNOSIS — M25562 Pain in left knee: Secondary | ICD-10-CM

## 2022-12-20 DIAGNOSIS — I10 Essential (primary) hypertension: Secondary | ICD-10-CM | POA: Diagnosis not present

## 2022-12-20 DIAGNOSIS — M48061 Spinal stenosis, lumbar region without neurogenic claudication: Secondary | ICD-10-CM | POA: Diagnosis not present

## 2022-12-20 DIAGNOSIS — R1030 Lower abdominal pain, unspecified: Secondary | ICD-10-CM | POA: Diagnosis not present

## 2022-12-20 DIAGNOSIS — I480 Paroxysmal atrial fibrillation: Secondary | ICD-10-CM | POA: Diagnosis not present

## 2022-12-20 DIAGNOSIS — R1032 Left lower quadrant pain: Secondary | ICD-10-CM

## 2022-12-20 DIAGNOSIS — M542 Cervicalgia: Secondary | ICD-10-CM | POA: Diagnosis not present

## 2022-12-20 DIAGNOSIS — M1612 Unilateral primary osteoarthritis, left hip: Secondary | ICD-10-CM | POA: Diagnosis not present

## 2022-12-20 DIAGNOSIS — M1712 Unilateral primary osteoarthritis, left knee: Secondary | ICD-10-CM | POA: Diagnosis not present

## 2022-12-29 ENCOUNTER — Other Ambulatory Visit (HOSPITAL_BASED_OUTPATIENT_CLINIC_OR_DEPARTMENT_OTHER): Payer: Self-pay | Admitting: *Deleted

## 2022-12-29 DIAGNOSIS — N3281 Overactive bladder: Secondary | ICD-10-CM

## 2022-12-29 MED ORDER — SOLIFENACIN SUCCINATE 5 MG PO TABS
5.0000 mg | ORAL_TABLET | Freq: Every day | ORAL | 0 refills | Status: DC
Start: 2022-12-29 — End: 2022-12-29

## 2022-12-29 MED ORDER — SOLIFENACIN SUCCINATE 5 MG PO TABS
5.0000 mg | ORAL_TABLET | Freq: Every day | ORAL | 0 refills | Status: DC
Start: 2022-12-29 — End: 2023-10-02

## 2022-12-29 NOTE — Telephone Encounter (Signed)
Received fax from [facility] for Rx refill for generic Vesicare 5mg .  Pt was seen a year ago.  Per Dr Hyacinth Meeker verbally - ok to fill one time and have a medication follow up visit within the next 90 days.  Pt will be called to schedule. KW CMA

## 2023-01-06 DIAGNOSIS — R69 Illness, unspecified: Secondary | ICD-10-CM | POA: Diagnosis not present

## 2023-01-08 DIAGNOSIS — N3281 Overactive bladder: Secondary | ICD-10-CM | POA: Diagnosis not present

## 2023-01-08 DIAGNOSIS — F5104 Psychophysiologic insomnia: Secondary | ICD-10-CM | POA: Diagnosis not present

## 2023-01-08 DIAGNOSIS — I739 Peripheral vascular disease, unspecified: Secondary | ICD-10-CM | POA: Diagnosis not present

## 2023-01-08 DIAGNOSIS — I700 Atherosclerosis of aorta: Secondary | ICD-10-CM | POA: Diagnosis not present

## 2023-01-08 DIAGNOSIS — E7800 Pure hypercholesterolemia, unspecified: Secondary | ICD-10-CM | POA: Diagnosis not present

## 2023-01-08 DIAGNOSIS — I480 Paroxysmal atrial fibrillation: Secondary | ICD-10-CM | POA: Diagnosis not present

## 2023-01-08 DIAGNOSIS — I10 Essential (primary) hypertension: Secondary | ICD-10-CM | POA: Diagnosis not present

## 2023-01-10 ENCOUNTER — Ambulatory Visit: Payer: Medicare HMO | Admitting: Orthopedic Surgery

## 2023-01-10 ENCOUNTER — Encounter: Payer: Self-pay | Admitting: Orthopedic Surgery

## 2023-01-10 DIAGNOSIS — M1712 Unilateral primary osteoarthritis, left knee: Secondary | ICD-10-CM

## 2023-01-10 MED ORDER — LIDOCAINE HCL 1 % IJ SOLN
5.0000 mL | INTRAMUSCULAR | Status: AC | PRN
Start: 2023-01-10 — End: 2023-01-10
  Administered 2023-01-10: 5 mL

## 2023-01-10 MED ORDER — METHYLPREDNISOLONE ACETATE 40 MG/ML IJ SUSP
40.0000 mg | INTRAMUSCULAR | Status: AC | PRN
  Administered 2023-01-10: 40 mg via INTRA_ARTICULAR

## 2023-01-10 MED ORDER — BUPIVACAINE HCL 0.25 % IJ SOLN
4.0000 mL | INTRAMUSCULAR | Status: AC | PRN
Start: 2023-01-10 — End: 2023-01-10
  Administered 2023-01-10: 4 mL via INTRA_ARTICULAR

## 2023-01-10 NOTE — Progress Notes (Signed)
Office Visit Note   Patient: Jodi Cline           Date of Birth: 18-Apr-1941           MRN: [id number] Visit Date: 01/10/2023 Requested by: Kirby Funk, MD [address] [place],  [place] [address] PCP: Kirby Funk, MD  Subjective: Chief Complaint  Patient presents with   Left Leg - Pain    Hip & knee    HPI: Jodi Cline is a 82 y.o. female who presents to the office reporting bilateral knee pain as well as hip pain for the past 6 months.  Denies any history of injury.  Reports trochanteric pain as well as some groin pain on that left-hand side.  Describes weakness and giving way.  Pain does wake from sleep at night.  Taking Tylenol with some relief.  Did have L-spine MRI in 2021 which showed right-sided synovial cyst.  Hard for her to do work in the yard.  Uneven ground is also difficult to walk on.  Radiographs on the system are reviewed.  Shows mild facet arthritis in the lumbar spine along with mild hip arthritis.  Minimal degenerative changes in the knee except for very mild medial joint space narrowing..                ROS: All systems reviewed are negative as they relate to the chief complaint within the history of present illness.  Patient denies fevers or chills.  Assessment & Plan: Visit Diagnoses: No diagnosis found.  Plan: Impression is mild left hip arthritis.  Left knee pain is which could be degenerative meniscal tearing.  Injection performed to the knee with this fails to relieve symptoms then could consider further imaging.  Follow-Up Instructions: No follow-ups on file.   Orders:  No orders of the defined types were placed in this encounter.  No orders of the defined types were placed in this encounter.     Procedures: Large Joint Inj: L knee on 01/10/2023 8:59 PM Indications: diagnostic evaluation, joint swelling and pain Details: 18 G 1.5 in needle, superolateral approach  Arthrogram: No  Medications: 5 mL lidocaine 1 %; 40 mg  methylPREDNISolone acetate 40 MG/ML; 4 mL bupivacaine 0.25 % Outcome: tolerated well, no immediate complications Procedure, treatment alternatives, risks and benefits explained, specific risks discussed. Consent was given by the patient. Immediately prior to procedure a time out was called to verify the correct patient, procedure, equipment, support staff and site/side marked as required. Patient was prepped and draped in the usual sterile fashion.       Clinical Data: No additional findings.  Objective: Vital Signs: There were no vitals taken for this visit.  Physical Exam:  Constitutional: Patient appears well-developed HEENT:  Head: Normocephalic Eyes:EOM are normal Neck: Normal range of motion Cardiovascular: Normal rate Pulmonary/chest: Effort normal Neurologic: Patient is alert Skin: Skin is warm Psychiatric: Patient has normal mood and affect  Ortho Exam: Ortho exam demonstrates very mild groin pain on the left with internal/external rotation of the leg.  Left knee has no effusion but mild medial and lateral joint line tenderness with intact extensor mechanism.  Range of motion is full.  No calf tenderness negative Homans.  Ankle dorsiflexion intact bilaterally.  Equivocal McMurray compression testing for left-sided pathology.  No paresthesias L1-S1 bilaterally.  Specialty Comments:  No specialty comments available.  Imaging: No results found.   PMFS History: Patient Active Problem List   Diagnosis Date Noted  Paroxysmal atrial fibrillation (HCC) 04/17/2022   LBBB (left bundle branch block) 04/17/2022   Aneurysm of left internal carotid artery 04/10/2022   DDD (degenerative disc disease), cervical 04/10/2022   Hardening of the aorta (main artery of the heart) (HCC) 04/10/2022   History of irritable bowel syndrome 04/10/2022   Personal history of colonic polyps 04/10/2022   Irritable bowel syndrome with diarrhea 04/10/2022   Lumbar spondylosis 04/10/2022    Overactive bladder 04/10/2022   Peripheral arterial disease (HCC) 04/10/2022   Prolapsed internal hemorrhoids 04/10/2022   Pulmonary emphysema (HCC) 04/10/2022   Pulmonary nodule 04/10/2022   Senile purpura (HCC) 04/10/2022   Vaginal lesion 04/10/2022   Syncope 02/04/2021   Hypokalemia 02/04/2021   Sepsis (HCC) 02/04/2021   Urinary incontinence 02/04/2021   Atrial fibrillation with RVR (HCC) 02/03/2021   Spinal stenosis at L4-L5 level 11/11/2020   Synovial cyst of lumbar facet joint 06/03/2020   Greater trochanteric pain syndrome of left lower extremity 10/31/2017   Retrocalcaneal bursitis (back of heel), left 10/31/2017   Essential hypertension 09/27/2017   Benign neoplasm of colon 09/27/2017   Cannot sleep 09/27/2017   Constipation by outlet dysfunction 09/27/2017   Hematuria syndrome 09/27/2017   Hypercholesterolemia 09/27/2017   Radiculitis 09/27/2017   Past Medical History:  Diagnosis Date   Anemia    Anxiety    Arthritis    BCC (basal cell carcinoma) 04/18/2005   Right mid thigh (CX35FU)   BCC (basal cell carcinoma) 04/07/2009   right shoulder blade (Cx3)   Depression    GERD (gastroesophageal reflux disease)    Hyperlipidemia    Hypertension    Insomnia    Left bundle branch block (LBBB) 08/2019   Nodular basal cell carcinoma (BCC) 08/05/2013   right shoulder blade (EXC)   PAD (peripheral artery disease) (HCC)    Paroxysmal atrial fibrillation (HCC)    Pneumonia    as a child   SCC (squamous cell carcinoma) 02/19/2001   left arm (CX35FU)   SCC (squamous cell carcinoma) 04/23/2013   left shin (txpbx)   SCC (squamous cell carcinoma) 08/05/2013   left shin (txpbx)   SCC (squamous cell carcinoma) 05/07/2014   Left shin (txpbx)   SCC (squamous cell carcinoma) 08/04/2014   left shin (MOHS)   Superficial basal cell carcinoma (BCC) 07/13/2015   right thigh (tx p bx)    Family History  Problem Relation Age of Onset   Atrial fibrillation Brother     Past  Surgical History:  Procedure Laterality Date   CESAREAN SECTION     LUMBAR LAMINECTOMY/DECOMPRESSION MICRODISCECTOMY N/A 11/11/2020   Procedure: Mircolumbar revision decompression Lumbar four-five bilaterally, excision of synovial cyst;  Surgeon: Jene Every, MD;  Location: [facility] OR;  Service: Orthopedics;  Laterality: N/A;   THORACIC SPINE SURGERY  1999   Dr. Shelle Iron   TUBAL LIGATION     Social History   Occupational History   Not on file  Tobacco Use   Smoking status: Former   Smokeless tobacco: Never   Tobacco comments:    Quit at age 67 years  Vaping Use   Vaping Use: Never used  Substance and Sexual Activity   Alcohol use: Yes    Alcohol/week: 14.0 standard drinks of alcohol    Types: 14 Glasses of wine per week    Comment: 2 glasses of wine a day   Drug use: Never   Sexual activity: Not on file

## 2023-01-11 DIAGNOSIS — Z5189 Encounter for other specified aftercare: Secondary | ICD-10-CM | POA: Diagnosis not present

## 2023-01-11 DIAGNOSIS — L570 Actinic keratosis: Secondary | ICD-10-CM | POA: Diagnosis not present

## 2023-01-24 DIAGNOSIS — L570 Actinic keratosis: Secondary | ICD-10-CM | POA: Diagnosis not present

## 2023-01-24 DIAGNOSIS — C44729 Squamous cell carcinoma of skin of left lower limb, including hip: Secondary | ICD-10-CM | POA: Diagnosis not present

## 2023-01-24 DIAGNOSIS — I872 Venous insufficiency (chronic) (peripheral): Secondary | ICD-10-CM | POA: Diagnosis not present

## 2023-01-24 DIAGNOSIS — Q828 Other specified congenital malformations of skin: Secondary | ICD-10-CM | POA: Diagnosis not present

## 2023-01-24 DIAGNOSIS — D485 Neoplasm of uncertain behavior of skin: Secondary | ICD-10-CM | POA: Diagnosis not present

## 2023-02-08 DIAGNOSIS — D485 Neoplasm of uncertain behavior of skin: Secondary | ICD-10-CM | POA: Diagnosis not present

## 2023-02-08 DIAGNOSIS — Z5189 Encounter for other specified aftercare: Secondary | ICD-10-CM | POA: Diagnosis not present

## 2023-02-08 DIAGNOSIS — D0462 Carcinoma in situ of skin of left upper limb, including shoulder: Secondary | ICD-10-CM | POA: Diagnosis not present

## 2023-02-14 DIAGNOSIS — H40053 Ocular hypertension, bilateral: Secondary | ICD-10-CM | POA: Diagnosis not present

## 2023-02-14 DIAGNOSIS — H18513 Endothelial corneal dystrophy, bilateral: Secondary | ICD-10-CM | POA: Diagnosis not present

## 2023-02-14 DIAGNOSIS — Z961 Presence of intraocular lens: Secondary | ICD-10-CM | POA: Diagnosis not present

## 2023-03-04 DIAGNOSIS — S40861A Insect bite (nonvenomous) of right upper arm, initial encounter: Secondary | ICD-10-CM | POA: Diagnosis not present

## 2023-03-04 DIAGNOSIS — S40862A Insect bite (nonvenomous) of left upper arm, initial encounter: Secondary | ICD-10-CM | POA: Diagnosis not present

## 2023-03-04 DIAGNOSIS — R030 Elevated blood-pressure reading, without diagnosis of hypertension: Secondary | ICD-10-CM | POA: Diagnosis not present

## 2023-03-04 DIAGNOSIS — T63461A Toxic effect of venom of wasps, accidental (unintentional), initial encounter: Secondary | ICD-10-CM | POA: Diagnosis not present

## 2023-03-12 DIAGNOSIS — I10 Essential (primary) hypertension: Secondary | ICD-10-CM | POA: Diagnosis not present

## 2023-03-13 DIAGNOSIS — C44729 Squamous cell carcinoma of skin of left lower limb, including hip: Secondary | ICD-10-CM | POA: Diagnosis not present

## 2023-03-14 DIAGNOSIS — H18513 Endothelial corneal dystrophy, bilateral: Secondary | ICD-10-CM | POA: Diagnosis not present

## 2023-03-14 DIAGNOSIS — Z961 Presence of intraocular lens: Secondary | ICD-10-CM | POA: Diagnosis not present

## 2023-03-14 DIAGNOSIS — H40053 Ocular hypertension, bilateral: Secondary | ICD-10-CM | POA: Diagnosis not present

## 2023-03-21 DIAGNOSIS — H40003 Preglaucoma, unspecified, bilateral: Secondary | ICD-10-CM | POA: Diagnosis not present

## 2023-03-29 DIAGNOSIS — I10 Essential (primary) hypertension: Secondary | ICD-10-CM | POA: Diagnosis not present

## 2023-04-02 DIAGNOSIS — C44629 Squamous cell carcinoma of skin of left upper limb, including shoulder: Secondary | ICD-10-CM | POA: Diagnosis not present

## 2023-04-16 ENCOUNTER — Ambulatory Visit (HOSPITAL_BASED_OUTPATIENT_CLINIC_OR_DEPARTMENT_OTHER): Payer: Medicare HMO | Admitting: Cardiology

## 2023-04-16 ENCOUNTER — Encounter (HOSPITAL_BASED_OUTPATIENT_CLINIC_OR_DEPARTMENT_OTHER): Payer: Self-pay | Admitting: Cardiology

## 2023-04-16 VITALS — BP 148/70 | HR 63 | Ht 65.0 in | Wt 158.6 lb

## 2023-04-16 DIAGNOSIS — D6869 Other thrombophilia: Secondary | ICD-10-CM

## 2023-04-16 DIAGNOSIS — I480 Paroxysmal atrial fibrillation: Secondary | ICD-10-CM

## 2023-04-16 DIAGNOSIS — I447 Left bundle-branch block, unspecified: Secondary | ICD-10-CM | POA: Diagnosis not present

## 2023-04-16 DIAGNOSIS — Z7901 Long term (current) use of anticoagulants: Secondary | ICD-10-CM

## 2023-04-16 DIAGNOSIS — I10 Essential (primary) hypertension: Secondary | ICD-10-CM | POA: Diagnosis not present

## 2023-04-16 DIAGNOSIS — E7800 Pure hypercholesterolemia, unspecified: Secondary | ICD-10-CM

## 2023-04-16 MED ORDER — AMLODIPINE BESYLATE 10 MG PO TABS
10.0000 mg | ORAL_TABLET | Freq: Every day | ORAL | 3 refills | Status: DC
Start: 2023-04-16 — End: 2023-10-02

## 2023-04-16 NOTE — Patient Instructions (Addendum)
We are going to change the diltiazem back to amlodipine. Do not take both. Your heart rate may go up with coming off of the diltiazem. It's ok as long as your resting heart rate is less than 90 bpm. If it is higher than this (when you are sitting checking your blood pressure) please let us know and we will talk about adding a low dose of another medication.  how to check blood pressure:  -sit comfortably in a chair, feet uncrossed and flat on floor, for 5-10 minutes  -arm ideally should rest at the level of the heart. However, arm should be relaxed and not tense (for example, do not hold the arm up unsupported)  -avoid exercise, caffeine, and tobacco for at least 30 minutes prior to BP reading  -don't take BP cuff reading over clothes (always place on skin directly)  -I prefer to know how well the medication is working, so I would like you to take your readings 1-2 hours after taking your blood pressure medication if possible   Goal blood pressure is <130/80. If you are still having numbers >140 on the top after a week or so of the amlodipine, let us know.  FOLLOW UP IN 1 YEAR WITH DR CHRISTOPHER OR Ronn Melena NP

## 2023-04-16 NOTE — Progress Notes (Signed)
Cardiology Office Note:  .    Date:  04/16/2023  ID:  Jodi Cline, DOB 1941/07/23, MRN [id number] PCP: Kirby Funk, MD (Inactive)  [facility] Providers Cardiologist:  Jodelle Red, MD     History of Present Illness: Jodi Cline is a 82 y.o. female with a hx of paroxysmal atrial fibrillation, LBBB, anemia, hypertension, hyperlipidemia, GERD, basal cell carcinoma, and squamous cell carcinoma, who is seen for follow-up.    Today, she states that she is struggling. Her blood pressure has been elevated at home, as high as 171 systolic. This seems to have been an acute change, previously her blood pressure was around 135 systolic or less. She reports that recently she was seen by cardiology at [facility] and her antihypertensives were adjusted. Valsartan had been increased. Currently on diltiazem 240 mg daily, and valsartan-HCTZ 320-12.5 mg daily. In the office today her blood pressure is initially 154/80, and 148/70 on manual recheck.  Lately she has been experiencing a lack of energy especially in the afternoons; she attributes this to the hot and humid weather. She has not been aware of any recent Afib episodes. She is in normal rhythm today.  She also notices mild LE swelling especially by the evenings/night time.  She denies any palpitations, chest pain, shortness of breath, lightheadedness, headaches, syncope, orthopnea, or PND.  ROS:  Please see the history of present illness. ROS otherwise negative except as noted.  (+) Fatigue (+) LE edema  Studies Reviewed: Marland Kitchen    EKG Interpretation Date/Time:  Monday April 16 2023 11:22:09 EDT Ventricular Rate:  63 PR Interval:  222 QRS Duration:  146 QT Interval:  464 QTC Calculation: 474 R Axis:   26  Text Interpretation: Sinus rhythm with 1st degree A-V block Left bundle branch block Confirmed by Jodelle Red [phone]) on 04/16/2023 11:35:28 AM    Physical Exam:    VS:  BP (!) 148/70 (BP Location: Right  Arm, Patient Position: Sitting, Cuff Size: Normal)   Pulse 63   Ht 5\' 5"  (1.651 m)   Wt 158 lb 9.6 oz (71.9 kg)   BMI 26.39 kg/m    Wt Readings from Last 3 Encounters:  04/16/23 158 lb 9.6 oz (71.9 kg)  04/17/22 158 lb (71.7 kg)  12/26/21 158 lb (71.7 kg)    GEN: Well nourished, well developed in no acute distress HEENT: Normal, moist mucous membranes NECK: No JVD CARDIAC: regular rhythm, normal S1 and S2, no rubs or gallops. 1/6 soft murmur. VASCULAR: Radial and DP pulses 2+ bilaterally. No carotid bruits RESPIRATORY:  Clear to auscultation without rales, wheezing or rhonchi  ABDOMEN: Soft, non-tender, non-distended MUSCULOSKELETAL:  Ambulates independently SKIN: Warm and dry, no edema NEUROLOGIC:  Alert and oriented x 3. No focal neuro deficits noted. PSYCHIATRIC:  Normal affect   ASSESSMENT AND PLAN: .    Hypertension -continue valsartan-hydrochlorothiazide -given elevated BP and rare afib, will change diltiazem to amlodipine (has tolerated previously) for better BP control -discussed HR, symptoms to watch for -reviewed how to check BP, when to call us  Paroxysmal atrial fibrillation LBBB -CHA2DS2/VAS Stroke Risk Points= 4  -continue apixaban 5 mg BID -changing diltiazem to amlodipine as above. If rate control agent needed, would use low dose beta blocker -in sinus today -LBBB stable    Hypercholesterolemia -continue rosuvastatin 20 mg daily   Cardiac risk counseling and prevention recommendations: -recommend heart healthy/Mediterranean diet, with whole grains, fruits, vegetable, fish, lean meats, nuts, and olive oil. Limit  salt. -recommend moderate walking, 3-5 times/week for 30-50 minutes each session. Aim for at least 150 minutes.week. Goal should be pace of 3 Jodi/hours, or walking 1.5 Jodi in 30 minutes -recommend avoidance of tobacco products. Avoid excess alcohol.  Dispo: Follow-up in 1 year, or sooner as needed.  I,Mathew Stumpf,acting as a Neurosurgeon for  [employer], MD.,have documented all relevant documentation on the behalf of Jodelle Red, MD,as directed by  Jodelle Red, MD while in the presence of Jodelle Red, MD.  I, Jodelle Red, MD, have reviewed all documentation for this visit. The documentation on 04/16/23 for the exam, diagnosis, procedures, and orders are all accurate and complete.  Signed, Jodelle Red, MD

## 2023-04-27 DIAGNOSIS — N390 Urinary tract infection, site not specified: Secondary | ICD-10-CM | POA: Diagnosis not present

## 2023-05-01 DIAGNOSIS — I10 Essential (primary) hypertension: Secondary | ICD-10-CM | POA: Diagnosis not present

## 2023-05-01 DIAGNOSIS — J439 Emphysema, unspecified: Secondary | ICD-10-CM | POA: Diagnosis not present

## 2023-05-03 ENCOUNTER — Other Ambulatory Visit (HOSPITAL_BASED_OUTPATIENT_CLINIC_OR_DEPARTMENT_OTHER): Payer: Self-pay | Admitting: Cardiology

## 2023-05-03 DIAGNOSIS — I480 Paroxysmal atrial fibrillation: Secondary | ICD-10-CM

## 2023-05-03 NOTE — Telephone Encounter (Signed)
Prescription refill request for Eliquis received. Indication: Afib  Last office visit: 04/16/23 Cristal Deer)  Scr: 0.87 (06/07/22)  Age: 82 Weight: 71.9kg  Appropriate dose. Refill sent.

## 2023-05-16 DIAGNOSIS — H40003 Preglaucoma, unspecified, bilateral: Secondary | ICD-10-CM | POA: Diagnosis not present

## 2023-05-16 DIAGNOSIS — H18513 Endothelial corneal dystrophy, bilateral: Secondary | ICD-10-CM | POA: Diagnosis not present

## 2023-05-16 DIAGNOSIS — Z961 Presence of intraocular lens: Secondary | ICD-10-CM | POA: Diagnosis not present

## 2023-06-12 DIAGNOSIS — I10 Essential (primary) hypertension: Secondary | ICD-10-CM | POA: Diagnosis not present

## 2023-06-12 DIAGNOSIS — R600 Localized edema: Secondary | ICD-10-CM | POA: Diagnosis not present

## 2023-07-08 DIAGNOSIS — N390 Urinary tract infection, site not specified: Secondary | ICD-10-CM | POA: Diagnosis not present

## 2023-07-17 DIAGNOSIS — I739 Peripheral vascular disease, unspecified: Secondary | ICD-10-CM | POA: Diagnosis not present

## 2023-07-17 DIAGNOSIS — I10 Essential (primary) hypertension: Secondary | ICD-10-CM | POA: Diagnosis not present

## 2023-07-17 DIAGNOSIS — I700 Atherosclerosis of aorta: Secondary | ICD-10-CM | POA: Diagnosis not present

## 2023-07-17 DIAGNOSIS — G4700 Insomnia, unspecified: Secondary | ICD-10-CM | POA: Diagnosis not present

## 2023-07-17 DIAGNOSIS — I480 Paroxysmal atrial fibrillation: Secondary | ICD-10-CM | POA: Diagnosis not present

## 2023-07-17 DIAGNOSIS — N390 Urinary tract infection, site not specified: Secondary | ICD-10-CM | POA: Diagnosis not present

## 2023-07-17 DIAGNOSIS — K580 Irritable bowel syndrome with diarrhea: Secondary | ICD-10-CM | POA: Diagnosis not present

## 2023-07-17 DIAGNOSIS — N3941 Urge incontinence: Secondary | ICD-10-CM | POA: Diagnosis not present

## 2023-07-17 DIAGNOSIS — N3281 Overactive bladder: Secondary | ICD-10-CM | POA: Diagnosis not present

## 2023-07-17 DIAGNOSIS — D692 Other nonthrombocytopenic purpura: Secondary | ICD-10-CM | POA: Diagnosis not present

## 2023-07-17 DIAGNOSIS — E7800 Pure hypercholesterolemia, unspecified: Secondary | ICD-10-CM | POA: Diagnosis not present

## 2023-10-02 ENCOUNTER — Encounter: Payer: Self-pay | Admitting: Obstetrics

## 2023-10-02 ENCOUNTER — Ambulatory Visit: Payer: PPO | Admitting: Obstetrics

## 2023-10-02 ENCOUNTER — Other Ambulatory Visit (HOSPITAL_COMMUNITY): Payer: Self-pay

## 2023-10-02 ENCOUNTER — Other Ambulatory Visit (HOSPITAL_COMMUNITY)
Admission: RE | Admit: 2023-10-02 | Discharge: 2023-10-02 | Disposition: A | Discharge status: Home / Self Care | Payer: PPO | Source: Other Acute Inpatient Hospital | Attending: Obstetrics | Admitting: Obstetrics

## 2023-10-02 VITALS — BP 132/81 | HR 59 | Ht 64.57 in | Wt 153.8 lb

## 2023-10-02 DIAGNOSIS — N393 Stress incontinence (female) (male): Secondary | ICD-10-CM | POA: Insufficient documentation

## 2023-10-02 DIAGNOSIS — R351 Nocturia: Secondary | ICD-10-CM | POA: Insufficient documentation

## 2023-10-02 DIAGNOSIS — R82998 Other abnormal findings in urine: Secondary | ICD-10-CM | POA: Diagnosis present

## 2023-10-02 DIAGNOSIS — Z87440 Personal history of urinary (tract) infections: Secondary | ICD-10-CM | POA: Diagnosis not present

## 2023-10-02 DIAGNOSIS — K5902 Outlet dysfunction constipation: Secondary | ICD-10-CM | POA: Diagnosis not present

## 2023-10-02 DIAGNOSIS — N952 Postmenopausal atrophic vaginitis: Secondary | ICD-10-CM

## 2023-10-02 DIAGNOSIS — N3281 Overactive bladder: Secondary | ICD-10-CM | POA: Diagnosis not present

## 2023-10-02 LAB — POCT URINALYSIS DIPSTICK
Bilirubin, UA: NEGATIVE
Blood, UA: NEGATIVE
Glucose, UA: NEGATIVE
Ketones, UA: NEGATIVE
Nitrite, UA: NEGATIVE
Protein, UA: NEGATIVE
Spec Grav, UA: 1.01 (ref 1.010–1.025)
Urobilinogen, UA: 0.2 U/dL
pH, UA: 6 (ref 5.0–8.0)

## 2023-10-02 MED ORDER — ESTRADIOL 0.1 MG/GM VA CREA
0.5000 g | TOPICAL_CREAM | VAGINAL | 3 refills | Status: AC
Start: 2023-10-04 — End: ?

## 2023-10-02 MED ORDER — GEMTESA 75 MG PO TABS: 75.0000 mg | ORAL_TABLET | Freq: Every day | ORAL | Status: AC

## 2023-10-02 MED ORDER — GEMTESA 75 MG PO TABS: 75.0000 mg | ORAL_TABLET | Freq: Every day | ORAL | 2 refills | Status: DC

## 2023-10-02 NOTE — Progress Notes (Signed)
 New Patient Evaluation and Consultation  Referring Provider: Alys Schuyler HERO, PA PCP: Signa Rush, MD (Inactive) Date of Service: 10/02/2023  SUBJECTIVE Chief Complaint: New Patient (Initial Visit) Jodi Cline is a 83 y.o. female here today for Chronic UTI and frequent urination)  History of Present Illness: Jodi Cline is a 83 y.o. White or Caucasian female seen in consultation at the request of PA [place] for evaluation of frequent UTI and urinary incontinence.    Stress urinary incontinence started since childbirth Urgency leakage started 2-3 years ago and started solifenacin  10mg  with minimal relief and uses at bedtime to reduce night time frequency Denies snoring or sleep apnea, grandchildren reports snoring.  Reports some memory issues PCN and Augmentin allergy with lip swelling  Reports recurrent UTI with 3 UTIs in 3 years. UTI with burning. Denies hematuria Denies hospitalization for UTI or kidney stones Urine testing: - 10/30/22 UA + leuk, culture < 10K cfu - 04/27/23 UA + leuk/heme - 07/08/23 UA + heme, >100K Staphylococcus lugdumensis resistant to PCN and tetracycline  Currently on doxycycline for the flu per patient for 10 days  Review of records significant for: A f.ib on eilquis due to syncope  Urinary Symptoms: Leaks urine with cough/ sneeze and laughing, worsened recently due to flu Leaks 5-6 time(s) per days when she recently had the flu, previously 1x/day. More bothersome due to flu Leaks 3x/week from urgency when she ignores her urge Pad use:  2-3  liners/ mini-pads per day.   Patient is bothered by UI symptoms.  Day time voids 5-6.  Nocturia: 3 times per night to void with insomnia on Ambien  started 3-4 years ago Reports prior use of compression socks Voiding dysfunction:  empties bladder well.  Patient does not use a catheter to empty bladder.  When urinating, patient feels a weak stream and dribbling after finishing Drinks: 64oz water per day,  drinks throughout night time  UTIs: 1 UTI's in the last year.   Denies history of blood in urine, kidney or bladder stones, pyelonephritis, bladder cancer, and kidney cancer No results found for the last 90 days.   Pelvic Organ Prolapse Symptoms:                  Patient Denies a feeling of a bulge the vaginal area.   Bowel Symptom: Bowel movements: 1 time(s) per day with IBS-D, intermittently every 2 weeks will go without bowel movements for 3 days managed by sennakot  Stool consistency: hard, solid, soft , loose, or liquid Straining: yes every 2 weeks Splinting: no.  Incomplete evacuation: no.  Patient Denies accidental bowel leakage / fecal incontinence Bowel regimen: stool softener daily and miralax  PRN Last colonoscopy: 2010 and 2021 per chart review, unable to recall but patient reports discontinuation due to age HM Colonoscopy   This patient has no relevant Health Maintenance data.     Sexual Function Sexually active: no.  Sexual orientation: Straight Pain with sex: No  Pelvic Pain Denies pelvic pain   Past Medical History:  Past Medical History:  Diagnosis Date   Anemia    Anxiety    Arthritis    BCC (basal cell carcinoma) 04/18/2005   Right mid thigh (CX35FU)   BCC (basal cell carcinoma) 04/07/2009   right shoulder blade (Cx3)   Depression    GERD (gastroesophageal reflux disease)    Hyperlipidemia    Hypertension    Insomnia    Left bundle branch block (LBBB) 08/2019   Nodular basal cell  carcinoma (BCC) 08/05/2013   right shoulder blade (EXC)   PAD (peripheral artery disease) (HCC)    Paroxysmal atrial fibrillation (HCC)    Pneumonia    as a child   SCC (squamous cell carcinoma) 02/19/2001   left arm (CX35FU)   SCC (squamous cell carcinoma) 04/23/2013   left shin (txpbx)   SCC (squamous cell carcinoma) 08/05/2013   left shin (txpbx)   SCC (squamous cell carcinoma) 05/07/2014   Left shin (txpbx)   SCC (squamous cell carcinoma) 08/04/2014   left  shin (MOHS)   Superficial basal cell carcinoma (BCC) 07/13/2015   right thigh (tx p bx)     Past Surgical History:   Past Surgical History:  Procedure Laterality Date   CESAREAN SECTION     LUMBAR LAMINECTOMY/DECOMPRESSION MICRODISCECTOMY N/A 11/11/2020   Procedure: Mircolumbar revision decompression Lumbar four-five bilaterally, excision of synovial cyst;  Surgeon: Duwayne Purchase, MD;  Location: [facility] OR;  Service: Orthopedics;  Laterality: N/A;   THORACIC SPINE SURGERY  1999   Dr. Duwayne   TUBAL LIGATION       Past OB/GYN History: OB History  Gravida Para Term Preterm AB Living  3 2 1 1 1 2   SAB IAB Ectopic Multiple Live Births  1    2    # Outcome Date GA Lbr Len/2nd Weight Sex Type Anes PTL Lv  3 Preterm     M CS-LTranv   LIV     Complications: Placenta Previa  2 SAB           1 Term     M Vag-Spont   LIV    Vaginal deliveries: 1, largest infant 6lbs Forceps/ Vacuum deliveries: 0, Cesarean section: 1 Menopausal: Yes, at age 74s, Denies vaginal bleeding since menopause Contraception: BTL. Last pap smear.  Any history of abnormal pap smears: no.    Component Value Date/Time   DIAGPAP  12/26/2021 1548    - Negative for intraepithelial lesion or malignancy (NILM)   ADEQPAP  12/26/2021 1548    Satisfactory for evaluation; transformation zone component PRESENT.    Medications: Patient has a current medication list which includes the following prescription(s): acetaminophen , calcium  carbonate, cholecalciferol, cyanocobalamin, eliquis , [START ON 10/04/2023] estradiol , famotidine , polyethylene glycol, probiotic product, rosuvastatin , valsartan -hydrochlorothiazide , gemtesa , gemtesa , and zolpidem .   Allergies: Patient is allergic to tramadol, atorvastatin, benazepril, cephalexin, and penicillins.   Social History:  Social History   Tobacco Use   Smoking status: Former   Smokeless tobacco: Never   Tobacco comments:    Quit at age 27 years  Vaping Use   Vaping status: Never  Used  Substance Use Topics   Alcohol use: Yes    Alcohol/week: 14.0 standard drinks of alcohol    Types: 14 Glasses of wine per week    Comment: 2 glasses of wine a day   Drug use: Never    Relationship status: divorced Patient lives by herself.   Patient is not employed. Regular exercise: No History of abuse: No  Family History:   Family History  Problem Relation Age of Onset   Atrial fibrillation Brother    Bladder Cancer Neg Hx    Uterine cancer Neg Hx      Review of Systems: Review of Systems  Constitutional:  Negative for fever, malaise/fatigue and weight loss.  Respiratory:  Negative for cough, shortness of breath and wheezing.   Cardiovascular:  Positive for leg swelling. Negative for chest pain and palpitations.  Gastrointestinal:  Negative for abdominal pain and blood  in stool.  Genitourinary:  Negative for hematuria.  Skin:  Negative for rash.  Neurological:  Negative for dizziness, weakness and headaches.  Endo/Heme/Allergies:  Bruises/bleeds easily.  Psychiatric/Behavioral:  Negative for depression. The patient is not nervous/anxious.      OBJECTIVE Physical Exam: Vitals:   10/02/23 1006  BP: 132/81  Pulse: (!) 59  Weight: 153 lb 12.8 oz (69.8 kg)  Height: 5' 4.57 (1.64 m)    Physical Exam Constitutional:      General: She is not in acute distress.    Appearance: Normal appearance.  Genitourinary:     Bladder and urethral meatus normal.     No lesions in the vagina.     Right Labia: No rash, tenderness, lesions, skin changes or Bartholin's cyst.    Left Labia: No tenderness, lesions, skin changes, Bartholin's cyst or rash.    No vaginal discharge, erythema, tenderness, bleeding, ulceration or granulation tissue.     Posterior vaginal prolapse present.    Severe vaginal atrophy present.     Right Adnexa: not tender, not full and no mass present.    Left Adnexa: not tender, not full and no mass present.    No cervical motion tenderness,  discharge, friability, lesion, polyp or nabothian cyst.     Uterus is not enlarged, fixed, tender or irregular.     No uterine mass detected.    Urethral meatus caruncle not present.    No urethral prolapse, tenderness, mass, hypermobility, discharge or stress urinary incontinence with cough stress test present.     Bladder is not tender, urgency on palpation not present and masses not present.      Pelvic Floor: Levator muscle strength is 2/5.    Levator ani is tender and obturator internus is tender.     No asymmetrical contractions present and no pelvic spasms present.    Symmetrical pelvic sensation, anal wink present and BC reflex present. Cardiovascular:     Rate and Rhythm: Normal rate.  Pulmonary:     Effort: Pulmonary effort is normal. No respiratory distress.  Abdominal:     General: There is no distension.     Palpations: Abdomen is soft. There is no mass.     Tenderness: There is no abdominal tenderness.     Hernia: No hernia is present.    Neurological:     Mental Status: She is alert.  Vitals reviewed. Exam conducted with a chaperone present.     POP-Q:   POP-Q  -3                                            Aa   -3                                           Ba  -6                                              C   1  Gh  4                                            Pb  8                                            tvl   -2                                            Ap  -2                                            Bp  -7                                              D     Post-Void Residual (PVR) by Bladder Scan: In order to evaluate bladder emptying, we discussed obtaining a postvoid residual and patient agreed to this procedure.  Procedure: The ultrasound unit was placed on the patient's abdomen in the suprapubic region after the patient had voided.    Post Void Residual - 10/02/23 1009       Post Void  Residual   Post Void Residual 7 mL              Laboratory Results: Lab Results  Component Value Date   COLORU Yellow 10/02/2023   CLARITYU Clear 10/02/2023   GLUCOSEUR Negative 10/02/2023   BILIRUBINUR Negative 10/02/2023   KETONESU Negative 10/02/2023   SPECGRAV 1.010 10/02/2023   RBCUR Negative 10/02/2023   PHUR 6.0 10/02/2023   PROTEINUR Negative 10/02/2023   UROBILINOGEN 0.2 10/02/2023   LEUKOCYTESUR Small (1+) (A) 10/02/2023    Lab Results  Component Value Date   CREATININE 0.87 06/07/2022   CREATININE 0.64 02/04/2021   CREATININE 0.84 02/03/2021    No results found for: HGBA1C  Lab Results  Component Value Date   HGB 13.1 06/07/2022     ASSESSMENT AND PLAN Ms. Remlinger is a 82 y.o. with:  1. Overactive bladder   2. Vaginal atrophy   3. History of recurrent UTI (urinary tract infection)   4. SUI (stress urinary incontinence, female)   5. Constipation by outlet dysfunction   6. Nocturia     Overactive bladder Assessment & Plan: - POCT + leuk, bladder scan 7mL - UUI 3x/week - minimal relief with solifenacin , discontinue due to constipation and risk of cognitive impairment for patients > 70 - We discussed the symptoms of overactive bladder (OAB), which include urinary urgency, urinary frequency, nocturia, with or without urge incontinence.  While we do not know the exact etiology of OAB, several treatment options exist. We discussed management including behavioral therapy (decreasing bladder irritants, urge suppression strategies, timed voids, bladder retraining), physical therapy, medication; for refractory cases posterior tibial nerve stimulation, sacral neuromodulation, and intravesical botulinum toxin injection.  For Beta-3 agonist medication, we discussed the potential side effect of  elevated blood pressure which is more likely to occur in individuals with uncontrolled hypertension. - trial of gemtesa  with samples and Rx provided - encouraged to  consider pelvic floor PT   Orders: -     POCT urinalysis dipstick -     Urine Culture; Future  Vaginal atrophy Assessment & Plan: - For symptomatic vaginal atrophy options include lubrication with a water-based lubricant, personal hygiene measures and barrier protection against wetness, and estrogen replacement in the form of vaginal cream, vaginal tablets, or a time-released vaginal ring.   - started vaginal estrogen   Orders: -     Estradiol ; Place 0.5 g vaginally 2 (two) times a week. Place 0.5g nightly for two weeks then twice a week after  Dispense: 30 g; Refill: 3  History of recurrent UTI (urinary tract infection) Assessment & Plan: - reports 1 UTI/year in the past 3 years, reviewed diagnostic criteria for recurrent UTI - 07/08/23 UA + heme, >100K Staphylococcus lugdumensis resistant to PCN and tetracycline - For treatment of recurrent urinary tract infections, we discussed management of recurrent UTIs including prophylaxis with a daily low dose antibiotic, transvaginal estrogen therapy, D-mannose, and cranberry supplements.  We discussed the role of diagnostic testing such as cystoscopy and upper tract imaging.   - encouraged to continue probiotics - start vaginal estrogen - return to office if she experiences UTI symptoms for testing  Orders: -     Estradiol ; Place 0.5 g vaginally 2 (two) times a week. Place 0.5g nightly for two weeks then twice a week after  Dispense: 30 g; Refill: 3 -     POCT urinalysis dipstick -     Urine Culture; Future  SUI (stress urinary incontinence, female) Assessment & Plan: - recent increase when she had the flu - For treatment of stress urinary incontinence,  non-surgical options include expectant management, weight loss, physical therapy, as well as a pessary.  Surgical options include a midurethral sling, Burch urethropexy, and transurethral injection of a bulking agent. - start vaginal estrogen - encouraged behavioral modifications and  Kegel exercises - encouraged to consider pelvic floor PT  Orders: -     POCT urinalysis dipstick -     Urine Culture; Future  Constipation by outlet dysfunction Assessment & Plan: - reports history of IBS-D with constipation and senna use every 2 weeks - For constipation, we reviewed the importance of a better bowel regimen.  We also discussed the importance of avoiding chronic straining, as it can exacerbate her pelvic floor symptoms; we discussed treating constipation and straining prior to surgery, as postoperative straining can lead to damage to the repair and recurrence of symptoms. We discussed initiating therapy with increasing fluid intake, fiber supplementation, stool softeners, and laxatives such as miralax .  - encouraged titration of daily miralax  or fiber supplementation to optimize stool consistency - discussed association with urinary symptoms - continue probiotics - encouraged to consider pelvic floor PT due to myofascial pelvic floor pain on exam   Nocturia Assessment & Plan: - avoid fluid intake after 6pm - elevated feet during the day or use compression socks to reduce lower extremity swelling - grandchildren reports snoring, discussed possible workup for sleep apnea if refractory to OAB treatments    Other orders -     Gemtesa ; Take 1 tablet (75 mg total) by mouth daily for 28 days. -     Gemtesa ; Take 1 tablet (75 mg total) by mouth daily.  Dispense: 30 tablet; Refill: 2   Time spent: I  spent 62 minutes dedicated to the care of this patient on the date of this encounter to include pre-visit review of records, face-to-face time with the patient discussing overactive bladder, stress urinary incontinence, history of UTI, vaginal atrophy, nocturia, constipation, and post visit documentation and ordering medication/ testing.    Lianne ONEIDA Gillis, MD

## 2023-10-02 NOTE — Patient Instructions (Addendum)
For treatment of stress urinary incontinence, which is leakage with physical activity/movement/strainging/coughing, we discussed expectant management versus nonsurgical options versus surgery. Nonsurgical options include weight loss, physical therapy, as well as a pessary.  Surgical options include a midurethral sling, which is a synthetic mesh sling that acts like a hammock under the urethra to prevent leakage of urine, a Burch urethropexy, and transurethral injection of a bulking agent.   We discussed the symptoms of overactive bladder (OAB), which include urinary urgency, urinary frequency, night-time urination, with or without urge incontinence.  We discussed management including behavioral therapy (decreasing bladder irritants by following a bladder diet, urge suppression strategies, timed voids, bladder retraining), physical therapy, medication; and for refractory cases posterior tibial nerve stimulation, sacral neuromodulation, and intravesical botulinum toxin injection.   Discontinue solifenacin.   For Beta-3 agonist medication, we discussed the potential side effect of elevated blood pressure which is more likely to occur in individuals with uncontrolled hypertension. You were given samples for Gemtesa 75 mg.  It can take a month to start working so give it time, but if you have bothersome side effects call sooner and we can try a different medication.  Call us if you have trouble filling the prescription or if it's not covered by your insurance.  For constipation, we reviewed the importance of a better bowel regimen.  We also discussed the importance of avoiding chronic straining, as it can exacerbate her pelvic floor symptoms; we discussed treating constipation and straining prior to surgery, as postoperative straining can lead to damage to the repair and recurrence of symptoms. We discussed initiating therapy with increasing fluid intake, fiber supplementation, stool softeners, and laxatives  such as miralax.   Women should try to eat at least 21 to 25 grams of fiber a day, while men should aim for 30 to 38 grams a day. You can add fiber to your diet with food or a fiber supplement such as psyllium (metamucil), benefiber, or fibercon.   Here's a look at how much dietary fiber is found in some common foods. When buying packaged foods, check the Nutrition Facts label for fiber content. It can vary among brands.  Fruits Serving size Total fiber (grams)*  Raspberries 1 cup 8.0  Pear 1 medium 5.5  Apple, with skin 1 medium 4.5  Banana 1 medium 3.0  Orange 1 medium 3.0  Strawberries 1 cup 3.0   Vegetables Serving size Total fiber (grams)*  Green peas, boiled 1 cup 9.0  Broccoli, boiled 1 cup chopped 5.0  Turnip greens, boiled 1 cup 5.0  Brussels sprouts, boiled 1 cup 4.0  Potato, with skin, baked 1 medium 4.0  Sweet corn, boiled 1 cup 3.5  Cauliflower, raw 1 cup chopped 2.0  Carrot, raw 1 medium 1.5   Grains Serving size Total fiber (grams)*  Spaghetti, whole-wheat, cooked 1 cup 6.0  Barley, pearled, cooked 1 cup 6.0  Bran flakes 3/4 cup 5.5  Quinoa, cooked 1 cup 5.0  Oat bran muffin 1 medium 5.0  Oatmeal, instant, cooked 1 cup 5.0  Popcorn, air-popped 3 cups 3.5  Brown rice, cooked 1 cup 3.5  Bread, whole-wheat 1 slice 2.0  Bread, rye 1 slice 2.0   Legumes, nuts and seeds Serving size Total fiber (grams)*  Split peas, boiled 1 cup 16.0  Lentils, boiled 1 cup 15.5  Black beans, boiled 1 cup 15.0  Baked beans, canned 1 cup 10.0  Chia seeds 1 ounce 10.0  Almonds 1 ounce (23 nuts) 3.5  Pistachios 1  ounce (49 nuts) 3.0  Sunflower kernels 1 ounce 3.0  *Rounded to nearest 0.5 gram. Source: [employer] for [employer], Legacy Release    For treatment of recurrent urinary tract infections, we discussed management of recurrent UTIs including prophylaxis with a daily low dose antibiotic, transvaginal estrogen therapy, D-mannose, and cranberry  supplements.  We discussed the role of diagnostic testing such as cystoscopy and upper tract imaging.     For symptomatic vaginal atrophy options include lubrication with a water-based lubricant, personal hygiene measures and barrier protection against wetness, and estrogen replacement in the form of vaginal cream, vaginal tablets, or a time-released vaginal ring.     Start vaginal estrogen therapy nightly for two weeks then 2 times weekly at night for treatment of vaginal atrophy (dryness of the vaginal tissues).  Please let us know if the prescription is too expensive and we can look for alternative options.

## 2023-10-02 NOTE — Assessment & Plan Note (Addendum)
-   reports history of IBS-D with constipation and senna use every 2 weeks - For constipation, we reviewed the importance of a better bowel regimen.  We also discussed the importance of avoiding chronic straining, as it can exacerbate her pelvic floor symptoms; we discussed treating constipation and straining prior to surgery, as postoperative straining can lead to damage to the repair and recurrence of symptoms. We discussed initiating therapy with increasing fluid intake, fiber supplementation, stool softeners, and laxatives such as miralax .  - encouraged titration of daily miralax  or fiber supplementation to optimize stool consistency - discussed association with urinary symptoms - continue probiotics - encouraged to consider pelvic floor PT due to myofascial pelvic floor pain on exam

## 2023-10-02 NOTE — Assessment & Plan Note (Signed)
-   avoid fluid intake after 6pm - elevated feet during the day or use compression socks to reduce lower extremity swelling - grandchildren reports snoring, discussed possible workup for sleep apnea if refractory to OAB treatments

## 2023-10-02 NOTE — Assessment & Plan Note (Addendum)
-   reports 1 UTI/year in the past 3 years, reviewed diagnostic criteria for recurrent UTI - 07/08/23 UA + heme, >100K Staphylococcus lugdumensis resistant to PCN and tetracycline - For treatment of recurrent urinary tract infections, we discussed management of recurrent UTIs including prophylaxis with a daily low dose antibiotic, transvaginal estrogen therapy, D-mannose, and cranberry supplements.  We discussed the role of diagnostic testing such as cystoscopy and upper tract imaging.   - encouraged to continue probiotics - start vaginal estrogen - return to office if she experiences UTI symptoms for testing

## 2023-10-02 NOTE — Assessment & Plan Note (Signed)
-   POCT + leuk, bladder scan 7mL - UUI 3x/week - minimal relief with solifenacin , discontinue due to constipation and risk of cognitive impairment for patients > 70 - We discussed the symptoms of overactive bladder (OAB), which include urinary urgency, urinary frequency, nocturia, with or without urge incontinence.  While we do not know the exact etiology of OAB, several treatment options exist. We discussed management including behavioral therapy (decreasing bladder irritants, urge suppression strategies, timed voids, bladder retraining), physical therapy, medication; for refractory cases posterior tibial nerve stimulation, sacral neuromodulation, and intravesical botulinum toxin injection.  For Beta-3 agonist medication, we discussed the potential side effect of elevated blood pressure which is more likely to occur in individuals with uncontrolled hypertension. - trial of gemtesa  with samples and Rx provided - encouraged to consider pelvic floor PT

## 2023-10-02 NOTE — Assessment & Plan Note (Addendum)
-   recent increase when she had the flu - For treatment of stress urinary incontinence,  non-surgical options include expectant management, weight loss, physical therapy, as well as a pessary.  Surgical options include a midurethral sling, Burch urethropexy, and transurethral injection of a bulking agent. - start vaginal estrogen - encouraged behavioral modifications and Kegel exercises - encouraged to consider pelvic floor PT

## 2023-10-02 NOTE — Assessment & Plan Note (Signed)
-   For symptomatic vaginal atrophy options include lubrication with a water-based lubricant, personal hygiene measures and barrier protection against wetness, and estrogen replacement in the form of vaginal cream, vaginal tablets, or a time-released vaginal ring.   - started vaginal estrogen

## 2023-10-03 ENCOUNTER — Encounter: Payer: Self-pay | Admitting: Obstetrics

## 2023-10-03 LAB — URINE CULTURE: Culture: NO GROWTH

## 2023-10-10 ENCOUNTER — Other Ambulatory Visit: Payer: Self-pay | Admitting: Obstetrics

## 2023-10-10 DIAGNOSIS — R351 Nocturia: Secondary | ICD-10-CM

## 2023-10-10 DIAGNOSIS — N3281 Overactive bladder: Secondary | ICD-10-CM

## 2023-10-10 MED ORDER — MIRABEGRON ER 25 MG PO TB24
25.0000 mg | ORAL_TABLET | Freq: Every day | ORAL | 0 refills | Status: DC
Start: 2023-10-10 — End: 2024-01-09

## 2023-10-10 NOTE — Telephone Encounter (Signed)
Attempted to contact patient. [facility]

## 2023-10-10 NOTE — Progress Notes (Signed)
Gemtesa cost prohibitive, $270/90 days. Rx send for mirabegron to assess cost.   For Beta-3 agonist medication,there is a potential side effect of elevated blood pressure which is more likely to occur in individuals with uncontrolled hypertension.Most recent blood pressure is within normal limits 130s/80s. Pt advised to monitor blood pressure and stop the medication if she experience any headache, chest discomfort, or shortness of breath and seek care immediately.  Prescription of mirabegron 25mg  sent to pharmacy. Start at 25mg  daily for 1 month, if blood pressure remains unchanged, increase to 50mg  after 1 month and continue to monitor blood pressure.

## 2023-10-11 NOTE — Telephone Encounter (Signed)
Patient stated she doesn't want switch to Mirabegron due to the [facility] being effective. In this case, I will attempt a tier exception.

## 2023-10-22 ENCOUNTER — Telehealth: Payer: Self-pay

## 2023-10-22 NOTE — Telephone Encounter (Signed)
 Appeal letter and documentation has been sent to insurance for appeal on Gemtesa.

## 2023-10-23 NOTE — Telephone Encounter (Signed)
 Appeal PA been approved patient cost is $117 for a 90 days supply. Pharmacy and patient are aware.

## 2023-11-03 ENCOUNTER — Other Ambulatory Visit (HOSPITAL_BASED_OUTPATIENT_CLINIC_OR_DEPARTMENT_OTHER): Payer: Self-pay | Admitting: Cardiology

## 2023-11-03 DIAGNOSIS — I480 Paroxysmal atrial fibrillation: Secondary | ICD-10-CM

## 2023-11-05 NOTE — Telephone Encounter (Signed)
 Prescription refill request for Eliquis received. Indication:afib Last office visit:8/24 ZHY:[id number] labs Age: 83 Weight:69.8  kg  Prescription refilled

## 2023-11-28 DIAGNOSIS — Z961 Presence of intraocular lens: Secondary | ICD-10-CM | POA: Diagnosis not present

## 2023-11-28 DIAGNOSIS — H18513 Endothelial corneal dystrophy, bilateral: Secondary | ICD-10-CM | POA: Diagnosis not present

## 2023-11-28 DIAGNOSIS — H40053 Ocular hypertension, bilateral: Secondary | ICD-10-CM | POA: Diagnosis not present

## 2023-12-05 ENCOUNTER — Other Ambulatory Visit (HOSPITAL_BASED_OUTPATIENT_CLINIC_OR_DEPARTMENT_OTHER): Payer: Self-pay | Admitting: Cardiology

## 2023-12-05 DIAGNOSIS — I480 Paroxysmal atrial fibrillation: Secondary | ICD-10-CM

## 2023-12-05 NOTE — Telephone Encounter (Signed)
 Prescription refill request for Eliquis received. Indication: Afib  Last office visit: 04/16/23 Cristal Deer)  Scr: 0.87 (06/07/22)  Age: 83 Weight: 69.8kg  Labs overdue. Called PCP and requested updated labs to be faxed to anticoagulation clinic

## 2023-12-05 NOTE — Telephone Encounter (Signed)
 Labs received via fax from PCP. Scr 0.76 on 10/08/23. Appropriate dose. Refill sent.

## 2023-12-31 ENCOUNTER — Ambulatory Visit: Payer: PPO | Admitting: Obstetrics

## 2024-01-09 ENCOUNTER — Ambulatory Visit: Admitting: Obstetrics

## 2024-01-09 ENCOUNTER — Encounter: Payer: Self-pay | Admitting: Obstetrics

## 2024-01-09 VITALS — BP 130/73 | HR 72

## 2024-01-09 DIAGNOSIS — R351 Nocturia: Secondary | ICD-10-CM | POA: Diagnosis not present

## 2024-01-09 DIAGNOSIS — N952 Postmenopausal atrophic vaginitis: Secondary | ICD-10-CM | POA: Diagnosis not present

## 2024-01-09 DIAGNOSIS — N393 Stress incontinence (female) (male): Secondary | ICD-10-CM

## 2024-01-09 DIAGNOSIS — N3281 Overactive bladder: Secondary | ICD-10-CM | POA: Diagnosis not present

## 2024-01-09 NOTE — Progress Notes (Signed)
 [facility] Return Visit  SUBJECTIVE  History of Present Illness: Jodi Cline is a 83 y.o. female seen in follow-up for overactive bladder, SUI, history of recurrent UTI, constipation, nocturia, and vaginal atrophy. Plan at last visit was trial of gemtesa , vaginal estrogen, continue probiotics.   Gemtesa  1/2 pill in the morning and 1/2 pill at night. Voids 1x/night, resolution of urgency leakage. Stopped using liners.  Vaginal estrogen 1g daily, denies vaginal bleeding or discharge.  Past Medical History: Patient  has a past medical history of Anemia, Anxiety, Arthritis, BCC (basal cell carcinoma) (04/18/2005), BCC (basal cell carcinoma) (04/07/2009), Depression, GERD (gastroesophageal reflux disease), Hyperlipidemia, Hypertension, Insomnia, Left bundle branch block (LBBB) (08/2019), Nodular basal cell carcinoma (BCC) (08/05/2013), PAD (peripheral artery disease) (HCC), Paroxysmal atrial fibrillation (HCC), Pneumonia, SCC (squamous cell carcinoma) (02/19/2001), SCC (squamous cell carcinoma) (04/23/2013), SCC (squamous cell carcinoma) (08/05/2013), SCC (squamous cell carcinoma) (05/07/2014), SCC (squamous cell carcinoma) (08/04/2014), and Superficial basal cell carcinoma (BCC) (07/13/2015).   Past Surgical History: She  has a past surgical history that includes Tubal ligation; Cesarean section; Thoracic spine surgery (1999); and Lumbar laminectomy/decompression microdiscectomy (N/A, 11/11/2020).   Medications: She has a current medication list which includes the following prescription(s): acetaminophen , apixaban , calcium  carbonate, cholecalciferol, cyanocobalamin, estradiol , famotidine , polyethylene glycol, probiotic product, rosuvastatin , valsartan -hydrochlorothiazide , gemtesa , and zolpidem .   Allergies: Patient is allergic to tramadol, atorvastatin, benazepril, cephalexin, and penicillins.   Social History: Patient  reports that she has quit smoking. She has never used  smokeless tobacco. She reports current alcohol use of about 14.0 standard drinks of alcohol per week. She reports that she does not use drugs.     OBJECTIVE     Physical Exam: Vitals:   01/09/24 1120 01/09/24 1144  BP: (!) 146/72 130/73  Pulse: 73 72   Gen: No apparent distress, A&O x 3.  Detailed Urogynecologic Evaluation:  Deferred.       ASSESSMENT AND PLAN    Jodi Cline is a 83 y.o. with:  1. Overactive bladder   2. Nocturia   3. Vaginal atrophy   4. SUI (stress urinary incontinence, female)     Overactive bladder Assessment & Plan: - prior POCT + leuk, bladder scan 7mL - UUI 3x/week resolved with Gemtesa  - minimal relief with solifenacin , discontinue due to constipation and risk of cognitive impairment for patients > 70 - We discussed the symptoms of overactive bladder (OAB), which include urinary urgency, urinary frequency, nocturia, with or without urge incontinence.  While we do not know the exact etiology of OAB, several treatment options exist. We discussed management including behavioral therapy (decreasing bladder irritants, urge suppression strategies, timed voids, bladder retraining), physical therapy, medication; for refractory cases posterior tibial nerve stimulation, sacral neuromodulation, and intravesical botulinum toxin injection.  For Beta-3 agonist medication, we discussed the potential side effect of elevated blood pressure which is more likely to occur in individuals with uncontrolled hypertension. - declines additional treatments at this time    Nocturia Assessment & Plan: - reduced to 1x/night, pt declines additional treatments - avoid fluid intake after 6pm - elevated feet during the day or use compression socks to reduce lower extremity swelling - grandchildren reports snoring, discussed possible workup for sleep apnea if refractory to OAB treatments   Vaginal atrophy Assessment & Plan: - For symptomatic vaginal atrophy options include  lubrication with a water-based lubricant, personal hygiene measures and barrier protection against wetness, and estrogen replacement in the form of vaginal cream, vaginal tablets, or a time-released vaginal ring.   -  started vaginal estrogen and using 1g nightly, advised to reduce to twice a week    SUI (stress urinary incontinence, female) Assessment & Plan: - resolved with Gemtesa  - prior increase when she had the flu - For treatment of stress urinary incontinence,  non-surgical options include expectant management, weight loss, physical therapy, as well as a pessary.  Surgical options include a midurethral sling, Burch urethropexy, and transurethral injection of a bulking agent. - encouraged to reduce vaginal estrogen use to 1g twice a week - encouraged behavioral modifications and Kegel exercises - consider pelvic floor PT if symptoms return   RTC PRN return of symptoms or in 1 year  Darlene Ehlers, MD

## 2024-01-09 NOTE — Patient Instructions (Signed)
 Reduce vaginal estrogen use to 2x/week.   Continue Gemtesa  daily.

## 2024-01-09 NOTE — Assessment & Plan Note (Signed)
-   reduced to 1x/night, pt declines additional treatments - avoid fluid intake after 6pm - elevated feet during the day or use compression socks to reduce lower extremity swelling - grandchildren reports snoring, discussed possible workup for sleep apnea if refractory to OAB treatments

## 2024-01-09 NOTE — Assessment & Plan Note (Signed)
-   For symptomatic vaginal atrophy options include lubrication with a water-based lubricant, personal hygiene measures and barrier protection against wetness, and estrogen replacement in the form of vaginal cream, vaginal tablets, or a time-released vaginal ring.   - started vaginal estrogen and using 1g nightly, advised to reduce to twice a week

## 2024-01-09 NOTE — Assessment & Plan Note (Signed)
-   resolved with Gemtesa  - prior increase when she had the flu - For treatment of stress urinary incontinence,  non-surgical options include expectant management, weight loss, physical therapy, as well as a pessary.  Surgical options include a midurethral sling, Burch urethropexy, and transurethral injection of a bulking agent. - encouraged to reduce vaginal estrogen use to 1g twice a week - encouraged behavioral modifications and Kegel exercises - consider pelvic floor PT if symptoms return

## 2024-01-09 NOTE — Assessment & Plan Note (Signed)
-   prior POCT + leuk, bladder scan 7mL - UUI 3x/week resolved with Gemtesa  - minimal relief with solifenacin , discontinue due to constipation and risk of cognitive impairment for patients > 70 - We discussed the symptoms of overactive bladder (OAB), which include urinary urgency, urinary frequency, nocturia, with or without urge incontinence.  While we do not know the exact etiology of OAB, several treatment options exist. We discussed management including behavioral therapy (decreasing bladder irritants, urge suppression strategies, timed voids, bladder retraining), physical therapy, medication; for refractory cases posterior tibial nerve stimulation, sacral neuromodulation, and intravesical botulinum toxin injection.  For Beta-3 agonist medication, we discussed the potential side effect of elevated blood pressure which is more likely to occur in individuals with uncontrolled hypertension. - declines additional treatments at this time

## 2024-01-15 DIAGNOSIS — I10 Essential (primary) hypertension: Secondary | ICD-10-CM | POA: Diagnosis not present

## 2024-01-15 DIAGNOSIS — G4700 Insomnia, unspecified: Secondary | ICD-10-CM | POA: Diagnosis not present

## 2024-01-15 DIAGNOSIS — I480 Paroxysmal atrial fibrillation: Secondary | ICD-10-CM | POA: Diagnosis not present

## 2024-01-15 DIAGNOSIS — E7800 Pure hypercholesterolemia, unspecified: Secondary | ICD-10-CM | POA: Diagnosis not present

## 2024-01-15 DIAGNOSIS — N3941 Urge incontinence: Secondary | ICD-10-CM | POA: Diagnosis not present

## 2024-01-18 DIAGNOSIS — L821 Other seborrheic keratosis: Secondary | ICD-10-CM | POA: Diagnosis not present

## 2024-01-18 DIAGNOSIS — L578 Other skin changes due to chronic exposure to nonionizing radiation: Secondary | ICD-10-CM | POA: Diagnosis not present

## 2024-01-18 DIAGNOSIS — D485 Neoplasm of uncertain behavior of skin: Secondary | ICD-10-CM | POA: Diagnosis not present

## 2024-01-18 DIAGNOSIS — B079 Viral wart, unspecified: Secondary | ICD-10-CM | POA: Diagnosis not present

## 2024-01-18 DIAGNOSIS — L565 Disseminated superficial actinic porokeratosis (DSAP): Secondary | ICD-10-CM | POA: Diagnosis not present

## 2024-01-18 DIAGNOSIS — D045 Carcinoma in situ of skin of trunk: Secondary | ICD-10-CM | POA: Diagnosis not present

## 2024-01-18 DIAGNOSIS — L814 Other melanin hyperpigmentation: Secondary | ICD-10-CM | POA: Diagnosis not present

## 2024-01-18 DIAGNOSIS — D0461 Carcinoma in situ of skin of right upper limb, including shoulder: Secondary | ICD-10-CM | POA: Diagnosis not present

## 2024-01-18 DIAGNOSIS — L570 Actinic keratosis: Secondary | ICD-10-CM | POA: Diagnosis not present

## 2024-01-18 DIAGNOSIS — D1801 Hemangioma of skin and subcutaneous tissue: Secondary | ICD-10-CM | POA: Diagnosis not present

## 2024-01-22 ENCOUNTER — Other Ambulatory Visit: Payer: Self-pay | Admitting: Obstetrics

## 2024-02-13 DIAGNOSIS — D045 Carcinoma in situ of skin of trunk: Secondary | ICD-10-CM | POA: Diagnosis not present

## 2024-02-13 DIAGNOSIS — L820 Inflamed seborrheic keratosis: Secondary | ICD-10-CM | POA: Diagnosis not present

## 2024-02-13 DIAGNOSIS — L570 Actinic keratosis: Secondary | ICD-10-CM | POA: Diagnosis not present

## 2024-02-13 DIAGNOSIS — T1490XD Injury, unspecified, subsequent encounter: Secondary | ICD-10-CM | POA: Diagnosis not present

## 2024-02-19 ENCOUNTER — Other Ambulatory Visit: Payer: Self-pay | Admitting: Obstetrics

## 2024-02-19 DIAGNOSIS — I480 Paroxysmal atrial fibrillation: Secondary | ICD-10-CM | POA: Diagnosis not present

## 2024-02-19 DIAGNOSIS — E7800 Pure hypercholesterolemia, unspecified: Secondary | ICD-10-CM | POA: Diagnosis not present

## 2024-02-19 DIAGNOSIS — F5104 Psychophysiologic insomnia: Secondary | ICD-10-CM | POA: Diagnosis not present

## 2024-02-19 DIAGNOSIS — I10 Essential (primary) hypertension: Secondary | ICD-10-CM | POA: Diagnosis not present

## 2024-02-19 DIAGNOSIS — J439 Emphysema, unspecified: Secondary | ICD-10-CM | POA: Diagnosis not present

## 2024-02-19 DIAGNOSIS — N3281 Overactive bladder: Secondary | ICD-10-CM

## 2024-02-19 MED ORDER — GEMTESA 75 MG PO TABS
1.0000 | ORAL_TABLET | Freq: Every day | ORAL | 3 refills | Status: AC
Start: 2024-02-19 — End: ?

## 2024-02-19 NOTE — Progress Notes (Signed)
 Rx refill for Gemtesa  sent

## 2024-02-25 DIAGNOSIS — J439 Emphysema, unspecified: Secondary | ICD-10-CM | POA: Diagnosis not present

## 2024-02-25 DIAGNOSIS — E7800 Pure hypercholesterolemia, unspecified: Secondary | ICD-10-CM | POA: Diagnosis not present

## 2024-02-25 DIAGNOSIS — I10 Essential (primary) hypertension: Secondary | ICD-10-CM | POA: Diagnosis not present

## 2024-02-25 DIAGNOSIS — I480 Paroxysmal atrial fibrillation: Secondary | ICD-10-CM | POA: Diagnosis not present

## 2024-03-04 ENCOUNTER — Telehealth: Payer: Self-pay

## 2024-03-04 ENCOUNTER — Telehealth: Payer: Self-pay | Admitting: Podiatry

## 2024-03-04 NOTE — Telephone Encounter (Signed)
 Patient also left a message on the nurse line - she would like an appointment with Dr. Gershon for her painful corns/calluses

## 2024-03-04 NOTE — Telephone Encounter (Signed)
 Returned patient call from voice mail to get her scheduled with a provider for a corn issue

## 2024-03-10 ENCOUNTER — Other Ambulatory Visit (HOSPITAL_BASED_OUTPATIENT_CLINIC_OR_DEPARTMENT_OTHER): Payer: Self-pay | Admitting: Cardiology

## 2024-03-10 DIAGNOSIS — I480 Paroxysmal atrial fibrillation: Secondary | ICD-10-CM

## 2024-03-10 NOTE — Telephone Encounter (Signed)
 Prescription refill request for Eliquis  received. Indication:afib Last office visit:8/24 Scr:1.04 1/25 Age: 83 Weight:69.8  kg  Prescription refilled

## 2024-03-12 DIAGNOSIS — H18513 Endothelial corneal dystrophy, bilateral: Secondary | ICD-10-CM | POA: Diagnosis not present

## 2024-03-12 DIAGNOSIS — Z961 Presence of intraocular lens: Secondary | ICD-10-CM | POA: Diagnosis not present

## 2024-03-12 DIAGNOSIS — H40053 Ocular hypertension, bilateral: Secondary | ICD-10-CM | POA: Diagnosis not present

## 2024-03-14 ENCOUNTER — Ambulatory Visit: Admitting: Podiatry

## 2024-03-14 ENCOUNTER — Ambulatory Visit

## 2024-03-14 VITALS — Ht 64.57 in | Wt 155.0 lb

## 2024-03-14 DIAGNOSIS — M19071 Primary osteoarthritis, right ankle and foot: Secondary | ICD-10-CM | POA: Diagnosis not present

## 2024-03-14 DIAGNOSIS — M19072 Primary osteoarthritis, left ankle and foot: Secondary | ICD-10-CM

## 2024-03-14 DIAGNOSIS — M2041 Other hammer toe(s) (acquired), right foot: Secondary | ICD-10-CM | POA: Diagnosis not present

## 2024-03-14 DIAGNOSIS — M2042 Other hammer toe(s) (acquired), left foot: Secondary | ICD-10-CM | POA: Diagnosis not present

## 2024-03-14 DIAGNOSIS — M778 Other enthesopathies, not elsewhere classified: Secondary | ICD-10-CM | POA: Diagnosis not present

## 2024-03-14 NOTE — Progress Notes (Signed)
 Subjective:   Patient ID: Jodi Cline, female   DOB: 83 y.o.   MRN: [id number]   HPI Chief Complaint  Patient presents with   Callouses    RM 12 Patient is here for bilateral callus on the top of index toes. Patient observed calluses has been present for the last two months.Patient states pain on the right index toe. Patient expressed concerns about possible hammertoes on both feet.   83 year old female presents the office above concerns.  She states that she thinks he started get corns on the tops of her second toes but she thinks it may be arthritis.  She does not report any injuries to the area the area is starting to get tender.  In general she has difficulty wearing shoes as she has pain in the top of her shoes.  She has been to [employer] and tried many different shoes without significant improvement.  She does not recall any injuries.   Review of Systems  All other systems reviewed and are negative.  Past Medical History:  Diagnosis Date   Anemia    Anxiety    Arthritis    BCC (basal cell carcinoma) 04/18/2005   Right mid thigh (CX35FU)   BCC (basal cell carcinoma) 04/07/2009   right shoulder blade (Cx3)   Depression    GERD (gastroesophageal reflux disease)    Hyperlipidemia    Hypertension    Insomnia    Left bundle branch block (LBBB) 08/2019   Nodular basal cell carcinoma (BCC) 08/05/2013   right shoulder blade (EXC)   PAD (peripheral artery disease) (HCC)    Paroxysmal atrial fibrillation (HCC)    Pneumonia    as a child   SCC (squamous cell carcinoma) 02/19/2001   left arm (CX35FU)   SCC (squamous cell carcinoma) 04/23/2013   left shin (txpbx)   SCC (squamous cell carcinoma) 08/05/2013   left shin (txpbx)   SCC (squamous cell carcinoma) 05/07/2014   Left shin (txpbx)   SCC (squamous cell carcinoma) 08/04/2014   left shin (MOHS)   Superficial basal cell carcinoma (BCC) 07/13/2015   right thigh (tx p bx)    Past Surgical History:  Procedure Laterality  Date   CESAREAN SECTION     LUMBAR LAMINECTOMY/DECOMPRESSION MICRODISCECTOMY N/A 11/11/2020   Procedure: Mircolumbar revision decompression Lumbar four-five bilaterally, excision of synovial cyst;  Surgeon: Duwayne Purchase, MD;  Location: [facility] OR;  Service: Orthopedics;  Laterality: N/A;   THORACIC SPINE SURGERY  1999   Dr. Duwayne   TUBAL LIGATION       Current Outpatient Medications:    acetaminophen  (TYLENOL ) 500 MG tablet, 1 tablet as needed, Disp: , Rfl:    calcium  carbonate (TUMS - DOSED IN MG ELEMENTAL CALCIUM ) 500 MG chewable tablet, Chew 500 mg by mouth daily as needed for indigestion., Disp: , Rfl:    cholecalciferol (VITAMIN D3) 25 MCG (1000 UNIT) tablet, Take 1,000 Units by mouth daily., Disp: , Rfl:    Cyanocobalamin (VITAMIN B 12 PO), Take 1 tablet by mouth daily., Disp: , Rfl:    ELIQUIS  5 MG TABS tablet, TAKE 1 TABLET BY MOUTH TWICE A DAY, Disp: 180 tablet, Rfl: 1   estradiol  (ESTRACE ) 0.1 MG/GM vaginal cream, Place 0.5 g vaginally 2 (two) times a week. Place 0.5g nightly for two weeks then twice a week after, Disp: 30 g, Rfl: 3   famotidine  (PEPCID ) 20 MG tablet, Take 20 mg by mouth daily as needed for heartburn or indigestion., Disp: , Rfl:  polyethylene glycol (MIRALAX  / GLYCOLAX ) 17 g packet, Take 17 g by mouth daily as needed for mild constipation., Disp: 30 packet, Rfl: 1   Probiotic Product (PROBIOTIC-10 PO), Take 1 tablet by mouth daily., Disp: , Rfl:    rosuvastatin  (CRESTOR ) 20 MG tablet, Take 20 mg by mouth daily., Disp: , Rfl:    valsartan -hydrochlorothiazide  (DIOVAN -HCT) 320-12.5 MG tablet, Take 1 tablet by mouth daily., Disp: , Rfl:    Vibegron  (GEMTESA ) 75 MG TABS, Take 1 tablet (75 mg total) by mouth daily., Disp: 90 tablet, Rfl: 3   zolpidem  (AMBIEN ) 10 MG tablet, Take 10 mg by mouth at bedtime., Disp: , Rfl:   Allergies  Allergen Reactions   Tramadol Swelling    Lips swell   Atorvastatin     Other reaction(s): myalgia   Benazepril Swelling    Lip  swelling   Cephalexin Swelling    Lips Swelling   Penicillins Swelling    Reaction: unknown          Objective:  Physical Exam  General: AAO x3, NAD  Dermatological: There is no significant callus today but there is a start of 1 on the right second.  There is no open lesions identified.  Vascular: Dorsalis Pedis artery and Posterior Tibial artery pedal pulses are 2/4 bilateral with immedate capillary fill time.  There is no pain with calf compression, swelling, warmth, erythema.   Neruologic: Grossly intact via light touch bilateral.   Musculoskeletal: There is some mild hammertoe deformity noted with tenderness palpation on the PIPJ of the second toes bilaterally with decreased range of motion.  She does get discomfort on the dorsal aspect of the midfoot bilaterally along the course of the Lisfranc joint but no specific area pinpoint tenderness.  MMT 5/5.      Assessment:   Arthritis bilaterally, hammertoes, chronic foot pain.      Plan:  -Treatment options discussed including all alternatives, risks, and complications -Etiology of symptoms were discussed -X-rays were obtained and reviewed with the patient.  Multiple views of the foot were obtained.  No evidence of acute fracture.  There is decreased joint space on the IPJ.  Arthritis along the Lisfranc joint. -I dispensed toe caps.  She previously was using Band-Aids which was helpful.  Discussed shoe modifications avoid excess pressure -Midfoot pain we discussed more of an accommodative type orthotic to wear.  Will submit for HTA authorization as I do think inserts will be beneficial for her.   Return for inserts with Lolita .  Jodi Cline DPM

## 2024-03-26 ENCOUNTER — Telehealth: Payer: Self-pay

## 2024-03-26 NOTE — Telephone Encounter (Signed)
 HTA PA rcvd and approved   03/14/24-06/12/24  Auth# [id number]  Indexed in media

## 2024-03-27 DIAGNOSIS — J439 Emphysema, unspecified: Secondary | ICD-10-CM | POA: Diagnosis not present

## 2024-03-27 DIAGNOSIS — I480 Paroxysmal atrial fibrillation: Secondary | ICD-10-CM | POA: Diagnosis not present

## 2024-03-27 DIAGNOSIS — E7800 Pure hypercholesterolemia, unspecified: Secondary | ICD-10-CM | POA: Diagnosis not present

## 2024-03-27 DIAGNOSIS — I10 Essential (primary) hypertension: Secondary | ICD-10-CM | POA: Diagnosis not present

## 2024-04-01 ENCOUNTER — Ambulatory Visit

## 2024-04-01 NOTE — Progress Notes (Signed)
 Cut DM inserts heated up and molded to patient she wants to try this before she tries custom had a bad experience prior  Jodi Cline Cped,CFo, CFm

## 2024-05-14 ENCOUNTER — Encounter (HOSPITAL_BASED_OUTPATIENT_CLINIC_OR_DEPARTMENT_OTHER): Payer: Self-pay | Admitting: Cardiology

## 2024-05-14 ENCOUNTER — Ambulatory Visit (HOSPITAL_BASED_OUTPATIENT_CLINIC_OR_DEPARTMENT_OTHER): Admitting: Cardiology

## 2024-05-14 VITALS — BP 138/68 | HR 69 | Resp 17 | Ht 64.0 in | Wt 161.0 lb

## 2024-05-14 DIAGNOSIS — E7800 Pure hypercholesterolemia, unspecified: Secondary | ICD-10-CM | POA: Diagnosis not present

## 2024-05-14 DIAGNOSIS — I10 Essential (primary) hypertension: Secondary | ICD-10-CM | POA: Diagnosis not present

## 2024-05-14 DIAGNOSIS — I480 Paroxysmal atrial fibrillation: Secondary | ICD-10-CM

## 2024-05-14 DIAGNOSIS — D6869 Other thrombophilia: Secondary | ICD-10-CM

## 2024-05-14 DIAGNOSIS — I447 Left bundle-branch block, unspecified: Secondary | ICD-10-CM | POA: Diagnosis not present

## 2024-05-14 DIAGNOSIS — Z7901 Long term (current) use of anticoagulants: Secondary | ICD-10-CM | POA: Diagnosis not present

## 2024-05-14 NOTE — Progress Notes (Signed)
 Cardiology Office Note:  .    Date:  05/14/2024  ID:  Jodi Cline, DOB 1941-08-26, MRN [id number] PCP: [name], DO  [facility] Providers Cardiologist:  Shelda Bruckner, MD     History of Present Illness: Jodi Cline is a 83 y.o. female with a hx of paroxysmal atrial fibrillation, LBBB, anemia, hypertension, hyperlipidemia who is seen for follow-up.    Today: Overall doing well. Works in her garden, takes care of her 48 year old grandson. Has not felt any afib. Has not noticed any bleeding. Labs per [facility] reviewed, last Hgb 13.1, last Cr 0.87, which is stable.   We changed from diltiazem  to amlodipine  at last visit in 03/2023. At some point, she was changed back to diltiazem  (unclear when, thinks this might have been changed at a visit at [facility], updated in our system at urology visit 10/02/23). She does not recall any side effects from amlodipine  or palpitations.   ROS:  Denies chest pain, shortness of breath at rest or with normal exertion. No PND, orthopnea, LE edema or unexpected weight gain. No syncope or palpitations. ROS otherwise negative except as noted.   Studies Reviewed: SABRA    EKG Interpretation Date/Time:  Wednesday May 14 2024 16:47:57 EDT Ventricular Rate:  68 PR Interval:  204 QRS Duration:  144 QT Interval:  446 QTC Calculation: 474 R Axis:   -39  Text Interpretation: Normal sinus rhythm Left axis deviation Left bundle branch block Confirmed by Bruckner Shelda [phone]) on 05/14/2024 5:04:55 PM    Physical Exam:    VS:  BP (!) 150/60 (BP Location: Left Arm, Patient Position: Sitting, Cuff Size: Normal)   Pulse 69   Resp 17   Ht 5' 4 (1.626 m)   Wt 161 lb (73 kg)   SpO2 94%   BMI 27.64 kg/m    Wt Readings from Last 3 Encounters:  05/14/24 161 lb (73 kg)  03/14/24 155 lb (70.3 kg)  10/02/23 153 lb 12.8 oz (69.8 kg)    GEN: Well nourished, well developed in no acute distress HEENT: Normal, moist mucous membranes NECK: No  JVD CARDIAC: regular rhythm, normal S1 and S2, no rubs or gallops. 1/6 soft murmur. VASCULAR: Radial and DP pulses 2+ bilaterally. No carotid bruits RESPIRATORY:  Clear to auscultation without rales, wheezing or rhonchi  ABDOMEN: Soft, non-tender, non-distended MUSCULOSKELETAL:  Ambulates independently SKIN: Warm and dry, no edema NEUROLOGIC:  Alert and oriented x 3. No focal neuro deficits noted. PSYCHIATRIC:  Normal affect   ASSESSMENT AND PLAN: .    Hypertension -continue valsartan -hydrochlorothiazide  -continue diltiazem . Tried amlodipine , but this was stopped (unclear why) and returned to diltiazem . -discussed HR, symptoms to watch for -reviewed how to check BP, when to call us   Paroxysmal atrial fibrillation LBBB -CHA2DS2/VAS Stroke Risk Points= 4  -continue apixaban  5 mg BID -in sinus today -LBBB stable    Hypercholesterolemia -continue rosuvastatin  20 mg daily -due for labs with PCP in a few months. For primary prevention, do not need to be very aggressive given age   Cardiac risk counseling and prevention recommendations: -recommend heart healthy/Mediterranean diet, with whole grains, fruits, vegetable, fish, lean meats, nuts, and olive oil. Limit salt. -recommend moderate walking, 3-5 times/week for 30-50 minutes each session. Aim for at least 150 minutes.week. Goal should be pace of 3 miles/hours, or walking 1.5 miles in 30 minutes -recommend avoidance of tobacco products. Avoid excess alcohol.  Dispo: Follow-up in 1 year, or sooner as  needed.  Signed, Shelda Bruckner, MD

## 2024-05-14 NOTE — Patient Instructions (Signed)
 Medication Instructions:   Your physician recommends that you continue on your current medications as directed. Please refer to the Current Medication list given to you today.  *If you need a refill on your cardiac medications before your next appointment, please call your pharmacy*    Follow-Up: At [facility], you and your health needs are our priority.  As part of our continuing mission to provide you with exceptional heart care, our providers are all part of one team.  This team includes your primary Cardiologist (physician) and Advanced Practice Providers or APPs (Physician Assistants and Nurse Practitioners) who all work together to provide you with the care you need, when you need it.  Your next appointment:   1 year(s)  Provider:   Shelda Bruckner, MD, Rosaline Bane, NP, or Reche Finder, NP

## 2024-06-10 ENCOUNTER — Ambulatory Visit: Admitting: Physician Assistant

## 2024-06-10 DIAGNOSIS — M1712 Unilateral primary osteoarthritis, left knee: Secondary | ICD-10-CM

## 2024-06-10 MED ORDER — METHYLPREDNISOLONE ACETATE 40 MG/ML IJ SUSP
40.0000 mg | INTRAMUSCULAR | Status: AC | PRN
Start: 2024-06-10 — End: 2024-06-10
  Administered 2024-06-10: 40 mg via INTRA_ARTICULAR

## 2024-06-10 MED ORDER — LIDOCAINE HCL 1 % IJ SOLN
3.0000 mL | INTRAMUSCULAR | Status: AC | PRN
Start: 2024-06-10 — End: 2024-06-10
  Administered 2024-06-10: 3 mL

## 2024-06-10 NOTE — Progress Notes (Signed)
   Procedure Note  Patient: Jodi Cline             Date of Birth: 03-Oct-1940           MRN: [id number]             Visit Date: 06/10/2024 HPI: [name] 83 year old female comes in today requesting cortisone injection left knee.  She last saw Dr. Addie on 01/10/2024 and was given a cortisone injection which was quite helpful.  She states over the last few months she has developed some pain in the knee.  She has some giving way and catching of the knee.  No known injury.  Does limp some.  Using no assistive device to ambulate.  Nondiabetic no fevers chills.  Prior radiographs left knee from 2024 showed some mild arthritic changes medial compartment.  Review of systems: See HPI otherwise negative  Physical exam: General Well-developed well-nourished pleasant female no acute distress mood affect appropriate Psych: Alert and orient x 3 Bilateral knees: Good range of motion of both knees no abnormal warmth erythema or effusion.  Left knee tenderness along medial joint line.  No McMurray's is negative on the left.    Procedures: Visit Diagnoses:  1. Arthritis of left knee     Large Joint Inj: L knee on 06/10/2024 11:30 AM Indications: pain Details: 22 G 1.5 in needle, anterolateral approach  Arthrogram: No  Medications: 3 mL lidocaine  1 %; 40 mg methylPREDNISolone  acetate 40 MG/ML Outcome: tolerated well, no immediate complications Procedure, treatment alternatives, risks and benefits explained, specific risks discussed. Consent was given by the patient. Immediately prior to procedure a time out was called to verify the correct patient, procedure, equipment, support staff and site/side marked as required. Patient was prepped and draped in the usual sterile fashion.    Plan: She will follow-up with us  as needed.  Mechanical symptoms to continue or if her knee pain becomes worse we will see her back in the office for repeat radiographs at that time.  Otherwise she can follow-up as  needed.

## 2024-06-11 ENCOUNTER — Encounter: Payer: Self-pay | Admitting: Physician Assistant

## 2024-06-27 DIAGNOSIS — L570 Actinic keratosis: Secondary | ICD-10-CM | POA: Diagnosis not present

## 2024-06-27 DIAGNOSIS — L565 Disseminated superficial actinic porokeratosis (DSAP): Secondary | ICD-10-CM | POA: Diagnosis not present

## 2024-06-27 DIAGNOSIS — D485 Neoplasm of uncertain behavior of skin: Secondary | ICD-10-CM | POA: Diagnosis not present

## 2024-06-30 ENCOUNTER — Encounter: Payer: Self-pay | Admitting: Radiology

## 2024-07-16 DIAGNOSIS — T1490XD Injury, unspecified, subsequent encounter: Secondary | ICD-10-CM | POA: Diagnosis not present

## 2024-07-16 DIAGNOSIS — L565 Disseminated superficial actinic porokeratosis (DSAP): Secondary | ICD-10-CM | POA: Diagnosis not present

## 2024-07-16 DIAGNOSIS — L03119 Cellulitis of unspecified part of limb: Secondary | ICD-10-CM | POA: Diagnosis not present

## 2024-07-28 ENCOUNTER — Encounter (HOSPITAL_BASED_OUTPATIENT_CLINIC_OR_DEPARTMENT_OTHER): Admitting: General Surgery

## 2024-07-29 NOTE — Progress Notes (Unsigned)
 Jodi Console, PA-C [address] [place], [place]  [address] Phone: [phone]   Gastroenterology Consultation  Referring Provider:     [name], DO Primary Care Physician:  [name], DO Primary Gastroenterologist:  Jodi Console, PA-C / Glendia Holt, MD  Reason for Consultation:     Rectal bleeding        HPI:   Discussed the use of AI scribe software for clinical note transcription with the patient, who gave verbal consent to proceed.  83 year old female, new to our practice, presents to evaluate rectal bleeding.  Previous patient of Dr. Luis.  Has history of hemorrhoids, adenomatous colon polyps, IBS, constipation, and exocrine pancreatic insufficiency. History of Present Illness Approximately six weeks ago, she experienced a significant episode of rectal bleeding while visiting [place] , described as 'a lot' and even getting on the floor. This was associated with diarrhea.  During the trip, she forgot to take her Eliquis , which she usually takes twice daily. She missed doses from Friday until Sunday, when she was able to fill a partial prescription. After resuming Eliquis , she has not experienced any further rectal bleeding.  She has a history of hemorrhoids, which tend to flare up when she is constipated. She occasionally uses Senokot to manage constipation.   Her last colonoscopy was in April 2021, during which a large polyp was removed from the rectum.  3-year repeat colonoscopy was recommended, however she has not had a colonoscopy since then.  11/2019 colonoscopy by Dr. Luis: Large rectal tubulovillous adenoma that was benign, resected in toto.  Extensive pan diverticulosis.  Cecal AVMs which have been quiescent.  No history of anemia or GI bleed/blood loss.  3-year repeat colonoscopy was recommended.  PMH: Paroxysmal A-fib, hypertension, LBBB, PAD, emphysema, IBS, constipation, chronic pain.    Past Medical History:  Diagnosis Date   Anemia     Anxiety    Arthritis    BCC (basal cell carcinoma) 04/18/2005   Right mid thigh (CX35FU)   BCC (basal cell carcinoma) 04/07/2009   right shoulder blade (Cx3)   Depression    GERD (gastroesophageal reflux disease)    Hyperlipidemia    Hypertension    Insomnia    Left bundle branch block (LBBB) 08/2019   Nodular basal cell carcinoma (BCC) 08/05/2013   right shoulder blade (EXC)   PAD (peripheral artery disease)    Paroxysmal atrial fibrillation (HCC)    Pneumonia    as a child   SCC (squamous cell carcinoma) 02/19/2001   left arm (CX35FU)   SCC (squamous cell carcinoma) 04/23/2013   left shin (txpbx)   SCC (squamous cell carcinoma) 08/05/2013   left shin (txpbx)   SCC (squamous cell carcinoma) 05/07/2014   Left shin (txpbx)   SCC (squamous cell carcinoma) 08/04/2014   left shin (MOHS)   Superficial basal cell carcinoma (BCC) 07/13/2015   right thigh (tx p bx)    Past Surgical History:  Procedure Laterality Date   CESAREAN SECTION     LUMBAR LAMINECTOMY/DECOMPRESSION MICRODISCECTOMY N/A 11/11/2020   Procedure: Mircolumbar revision decompression Lumbar four-five bilaterally, excision of synovial cyst;  Surgeon: Duwayne Purchase, MD;  Location: [facility] OR;  Service: Orthopedics;  Laterality: N/A;   THORACIC SPINE SURGERY  1999   Dr. Duwayne   TUBAL LIGATION      Prior to Admission medications   Medication Sig Start Date End Date Taking? Authorizing Provider  acetaminophen  (TYLENOL ) 500 MG tablet 1 tablet as needed    [provider]  calcium  carbonate (TUMS - DOSED IN MG ELEMENTAL CALCIUM ) 500 MG chewable tablet Chew 500 mg by mouth daily as needed for indigestion. 07/09/17   [provider]  cholecalciferol (VITAMIN D3) 25 MCG (1000 UNIT) tablet Take 1,000 Units by mouth daily.    [provider]  Cyanocobalamin (VITAMIN B 12 PO) Take 1 tablet by mouth daily.    [provider]  diltiazem  (CARDIZEM  LA) 240 MG 24 hr tablet Take 240 mg by mouth  daily.    [provider]  ELIQUIS  5 MG TABS tablet TAKE 1 TABLET BY MOUTH TWICE A DAY 03/10/24   Lonni Slain, MD  estradiol  (ESTRACE ) 0.1 MG/GM vaginal cream Place 0.5 g vaginally 2 (two) times a week. Place 0.5g nightly for two weeks then twice a week after 10/04/23   Guadlupe Dull T, MD  famotidine  (PEPCID ) 20 MG tablet Take 20 mg by mouth daily as needed for heartburn or indigestion.    [provider]  polyethylene glycol (MIRALAX  / GLYCOLAX ) 17 g packet Take 17 g by mouth daily as needed for mild constipation. 02/04/21   Rosario Leatrice FERNS, MD  Probiotic Product (PROBIOTIC-10 PO) Take 1 tablet by mouth daily.    [provider]  rosuvastatin  (CRESTOR ) 20 MG tablet Take 20 mg by mouth daily.    [provider]  valsartan -hydrochlorothiazide  (DIOVAN -HCT) 320-12.5 MG tablet Take 1 tablet by mouth daily. 03/13/23   [provider]  Vibegron  (GEMTESA ) 75 MG TABS Take 1 tablet (75 mg total) by mouth daily. 02/19/24   Guadlupe Dull DASEN, MD  zolpidem  (AMBIEN ) 10 MG tablet Take 10 mg by mouth at bedtime. 06/25/17   [provider]    Family History  Problem Relation Age of Onset   Cancer Mother    Atrial fibrillation Brother    Atrial fibrillation Brother    Atrial fibrillation Brother    Bladder Cancer Neg Hx    Uterine cancer Neg Hx    Renal cancer Neg Hx      Social History   Tobacco Use   Smoking status: Former    Current packs/day: 0.00    Average packs/day: 1 pack/day for 21.0 years (21.0 ttl pk-yrs)    Types: Cigarettes    Start date: 53    Quit date: 1976    Years since quitting: 49.9   Smokeless tobacco: Never   Tobacco comments:    Quit at age 82 years  Vaping Use   Vaping status: Never Used  Substance Use Topics   Alcohol use: Yes    Alcohol/week: 14.0 standard drinks of alcohol    Types: 14 Glasses of wine per week    Comment: 2 glasses of wine a day   Drug use: Never    Allergies as of 07/30/2024 - Review  Complete 07/30/2024  Allergen Reaction Noted   Tramadol Swelling 10/21/2019   Atorvastatin  01/05/2021   Benazepril Swelling 08/08/2017   Cephalexin Swelling 03/26/2014   Penicillins Swelling 12/10/2019    Review of Systems:    All systems reviewed and negative except where noted in HPI.   Physical Exam:  BP 126/76   Pulse 72   Ht 5' 6 (1.676 m)   Wt 158 lb (71.7 kg)   SpO2 98%   BMI 25.50 kg/m  No LMP recorded. Patient is postmenopausal.  General:   Alert,  Well-developed, well-nourished, pleasant and cooperative in NAD Lungs:  Respirations even and unlabored.  Clear throughout to auscultation.  No wheezes, crackles, or rhonchi. No acute distress. Heart:  Regular rate and rhythm; no murmurs, clicks, rubs, or gallops. Abdomen:  Normal bowel sounds.  No bruits.  Soft, and non-distended without masses, hepatosplenomegaly or hernias noted.  No Tenderness.  No guarding or rebound tenderness.    Neurologic:  Alert and oriented x3;  grossly normal neurologically. Psych:  Alert and cooperative. Normal mood and affect. Rectal: 1 tiny external hemorrhoid at the rectum which is not swollen and not thrombosed.  No anal fissures.  No internal masses or tenderness.  Stool is brown and Hemoccult negative.   Imaging Studies: No results found.  Labs: CBC    Component Value Date/Time   WBC 6.5 07/30/2024 1110   RBC 4.18 07/30/2024 1110   HGB 13.4 07/30/2024 1110   HGB 13.1 06/07/2022 0937   HCT 39.7 07/30/2024 1110   HCT 39.1 06/07/2022 0937   PLT 107.0 (L) 07/30/2024 1110   PLT CANCELED 06/07/2022 0937   MCV 95.1 07/30/2024 1110   MCV 94 06/07/2022 0937    CMP     Component Value Date/Time   NA 134 (L) 07/30/2024 1110   NA 137 06/07/2022 0937   K 4.7 07/30/2024 1110   CL 97 07/30/2024 1110   CO2 29 07/30/2024 1110   GLUCOSE 94 07/30/2024 1110   BUN 10 07/30/2024 1110   BUN 13 06/07/2022 0937   CREATININE 0.76 07/30/2024 1110   CALCIUM  9.7 07/30/2024 1110   PROT 6.0  (L) 02/04/2021 0400   ALBUMIN  3.4 (L) 02/04/2021 0400   AST 15 02/04/2021 0400   ALT 15 02/04/2021 0400   ALKPHOS 47 02/04/2021 0400   BILITOT 0.1 (L) 02/04/2021 0400   GFRNONAA >60 02/04/2021 0400    Assessment and Plan:   NAUREEN BENTON is a 83 y.o. y/o female has been referred for:   1.  Rectal bleeding 2.  History of large rectal tubulovillous adenoma removed 11/2019 colonoscopy by Dr. Luis. 3.  Hemorrhoids 4.  Pandiverticulosis 5.  Hx Cecal AVMs 6.  Irritable bowel syndrome with constipation and diarrhea 7.  Comorbidities: pA-fib, on Eliquis , emphysema - All Stable.  Not currently in Afib.  Plan: - Labs CBC, BMP - Scheduling Colonoscopy I discussed risks of colonoscopy with patient to include risk of bleeding, colon perforation, and risk of sedation.  Patient expressed understanding and agrees to proceed with colonoscopy.  - Request permission to hold Eliquis  2 days prior to colonoscopy from Cardiologist. - Start OTC Benefiber powder mix 1 to 2 tablespoons or drink daily. - Continue OTC Senokot as needed for constipation. - Discussed treatment for hemorrhoids.  Let us  know if she has another episode of rectal bleeding in the future.  Follow up based on colonoscopy results and GI symptoms.  Jodi Console, PA-C

## 2024-07-30 ENCOUNTER — Other Ambulatory Visit (INDEPENDENT_AMBULATORY_CARE_PROVIDER_SITE_OTHER)

## 2024-07-30 ENCOUNTER — Telehealth: Payer: Self-pay

## 2024-07-30 ENCOUNTER — Encounter: Payer: Self-pay | Admitting: Physician Assistant

## 2024-07-30 ENCOUNTER — Ambulatory Visit: Admitting: Physician Assistant

## 2024-07-30 VITALS — BP 126/76 | HR 72 | Ht 66.0 in | Wt 158.0 lb

## 2024-07-30 DIAGNOSIS — Z8719 Personal history of other diseases of the digestive system: Secondary | ICD-10-CM

## 2024-07-30 DIAGNOSIS — Z860100 Personal history of colon polyps, unspecified: Secondary | ICD-10-CM

## 2024-07-30 DIAGNOSIS — Z860101 Personal history of adenomatous and serrated colon polyps: Secondary | ICD-10-CM | POA: Diagnosis not present

## 2024-07-30 DIAGNOSIS — K5900 Constipation, unspecified: Secondary | ICD-10-CM

## 2024-07-30 DIAGNOSIS — R197 Diarrhea, unspecified: Secondary | ICD-10-CM

## 2024-07-30 DIAGNOSIS — K582 Mixed irritable bowel syndrome: Secondary | ICD-10-CM | POA: Diagnosis not present

## 2024-07-30 DIAGNOSIS — K625 Hemorrhage of anus and rectum: Secondary | ICD-10-CM

## 2024-07-30 DIAGNOSIS — K5730 Diverticulosis of large intestine without perforation or abscess without bleeding: Secondary | ICD-10-CM

## 2024-07-30 DIAGNOSIS — K649 Unspecified hemorrhoids: Secondary | ICD-10-CM

## 2024-07-30 LAB — CBC WITH DIFFERENTIAL/PLATELET
Basophils Absolute: 0.1 K/uL (ref 0.0–0.1)
Basophils Relative: 0.9 % (ref 0.0–3.0)
Eosinophils Absolute: 0.1 K/uL (ref 0.0–0.7)
Eosinophils Relative: 1.1 % (ref 0.0–5.0)
HCT: 39.7 % (ref 36.0–46.0)
Hemoglobin: 13.4 g/dL (ref 12.0–15.0)
Lymphocytes Relative: 26.9 % (ref 12.0–46.0)
Lymphs Abs: 1.8 K/uL (ref 0.7–4.0)
MCHC: 33.8 g/dL (ref 30.0–36.0)
MCV: 95.1 fl (ref 78.0–100.0)
Monocytes Absolute: 0.5 K/uL (ref 0.1–1.0)
Monocytes Relative: 7.6 % (ref 3.0–12.0)
Neutro Abs: 4.1 K/uL (ref 1.4–7.7)
Neutrophils Relative %: 63.5 % (ref 43.0–77.0)
Platelets: 107 K/uL — ABNORMAL LOW (ref 150.0–400.0)
RBC: 4.18 Mil/uL (ref 3.87–5.11)
RDW: 14 % (ref 11.5–15.5)
WBC: 6.5 K/uL (ref 4.0–10.5)

## 2024-07-30 LAB — BASIC METABOLIC PANEL WITH GFR
BUN: 10 mg/dL (ref 6–23)
CO2: 29 meq/L (ref 19–32)
Calcium: 9.7 mg/dL (ref 8.4–10.5)
Chloride: 97 meq/L (ref 96–112)
Creatinine, Ser: 0.76 mg/dL (ref 0.40–1.20)
GFR: 72.2 mL/min (ref >60.00)
Glucose, Bld: 94 mg/dL (ref 70–99)
Potassium: 4.7 meq/L (ref 3.5–5.1)
Sodium: 134 meq/L — ABNORMAL LOW (ref 135–145)

## 2024-07-30 MED ORDER — NA SULFATE-K SULFATE-MG SULF 17.5-3.13-1.6 GM/177ML PO SOLN: 1.0000 | Freq: Once | ORAL | 0 refills | Status: AC

## 2024-07-30 NOTE — Patient Instructions (Signed)
 Your provider has requested that you go to the basement level for lab work before leaving today. Press B on the elevator. The lab is located at the first door on the left as you exit the elevator.  You have been scheduled for a Colonoscopy. Please follow written instructions given to you at your visit today.   If you use inhalers (even only as needed), please bring them with you on the day of your procedure.  DO NOT TAKE 7 DAYS PRIOR TO TEST- Trulicity (dulaglutide) Ozempic, Wegovy (semaglutide) Mounjaro (tirzepatide) Bydureon Bcise (exanatide extended release)  DO NOT TAKE 1 DAY PRIOR TO YOUR TEST Rybelsus (semaglutide) Adlyxin (lixisenatide) Victoza (liraglutide) Byetta (exanatide) ___________________________________________________________________________  Please follow up sooner if symptoms increase or worsen   Due to recent changes in healthcare laws, you may see the results of your imaging and laboratory studies on MyChart before your provider has had a chance to review them.  We understand that in some cases there may be results that are confusing or concerning to you. Not all laboratory results come back in the same time frame and the provider may be waiting for multiple results in order to interpret others.  Please give us  48 hours in order for your provider to thoroughly review all the results before contacting the office for clarification of your results.   Thank you for trusting me with your gastrointestinal care!   Ellouise Console, PA-C _______________________________________________________  If your blood pressure at your visit was 140/90 or greater, please contact your primary care physician to follow up on this.  _______________________________________________________  If you are age 10 or older, your body mass index should be between 23-30. Your Body mass index is 25.5 kg/m. If this is out of the aforementioned range listed, please consider follow up with your Primary  Care Provider.  If you are age 40 or younger, your body mass index should be between 19-25. Your Body mass index is 25.5 kg/m. If this is out of the aformentioned range listed, please consider follow up with your Primary Care Provider.   ________________________________________________________  The [facility] providers would like to encourage you to use MYCHART to communicate with providers for non-urgent requests or questions.  Due to long hold times on the telephone, sending your provider a message by [facility] may be a faster and more efficient way to get a response.  Please allow 48 business hours for a response.  Please remember that this is for non-urgent requests.  _______________________________________________________

## 2024-07-30 NOTE — Telephone Encounter (Signed)
 [facility] Pre-operative Risk Assessment     Request for surgical clearance:     Endoscopy Procedure  What type of surgery is being performed?     Colonoscopy  When is this surgery scheduled?     09/10/24  What type of clearance is required ?   Pharmacy  Are there any medications that need to be held prior to surgery and how long? Eliquis  2 days  Practice name and name of physician performing surgery?       Gastroenterology  What is your office phone and fax number?      Phone- [phone]  Fax- [phone]  Anesthesia type (None, local, MAC, general) ?       MAC   Please route your response to Alethea Blocker, CMA

## 2024-07-31 ENCOUNTER — Ambulatory Visit: Payer: Self-pay | Admitting: Physician Assistant

## 2024-08-08 NOTE — Telephone Encounter (Signed)
° °  Patient Name: Jodi Cline  DOB: 02-Jun-1941 MRN: [id number]  Primary Cardiologist: Shelda Bruckner, MD  Clinical pharmacists have reviewed the patient's past medical history, labs, and current medications as part of preoperative protocol coverage. The following recommendations have been made:  Patient with diagnosis of afib on Eliquis  for anticoagulation.     What type of surgery is being performed?     Colonoscopy  When is this surgery scheduled?     09/10/24      CHA2DS2-VASc Score = 5   This indicates a 7.2% annual risk of stroke. The patient's score is based upon: CHF History: 0 HTN History: 1 Diabetes History: 0 Stroke History: 0 Vascular Disease History: 1 Age Score: 2 Gender Score: 1     CrCl 57 ml/min Platelet count 107 K   Patient has not had an Afib/aflutter ablation in the last 3 months, DCCV within the last 4 weeks or a watchman implanted in the last 45 days    Per office protocol, patient can hold Eliquis  for 2 days prior to procedure.     Patient will not need bridging with Lovenox (enoxaparin) around procedure.   I will route this recommendation to the requesting party via Epic fax function and remove from pre-op pool.  Please call with questions.  Lamarr Satterfield, NP 08/08/2024, 1:52 PM

## 2024-08-08 NOTE — Telephone Encounter (Signed)
 Patient with diagnosis of afib on Eliquis  for anticoagulation.    What type of surgery is being performed?     Colonoscopy  When is this surgery scheduled?     09/10/24    CHA2DS2-VASc Score = 5   This indicates a 7.2% annual risk of stroke. The patient's score is based upon: CHF History: 0 HTN History: 1 Diabetes History: 0 Stroke History: 0 Vascular Disease History: 1 Age Score: 2 Gender Score: 1    CrCl 57 ml/min Platelet count 107 K  Patient has not had an Afib/aflutter ablation in the last 3 months, DCCV within the last 4 weeks or a watchman implanted in the last 45 days   Per office protocol, patient can hold Eliquis  for 2 days prior to procedure.    Patient will not need bridging with Lovenox (enoxaparin) around procedure.  **This guidance is not considered finalized until pre-operative APP has relayed final recommendations.**

## 2024-08-12 ENCOUNTER — Telehealth: Payer: Self-pay | Admitting: Physician Assistant

## 2024-08-12 NOTE — Telephone Encounter (Signed)
 Inbound call from patient stating she would like to know if Ellouise can send in a potassium capsule prescription for her so her levels can get better before her upcoming procedure on 09/10/24 Please advise  Thank you

## 2024-08-13 NOTE — Telephone Encounter (Signed)
 PT is waiting on the recommendations from the PA. Please advise.

## 2024-08-14 NOTE — Telephone Encounter (Signed)
°  I spoke with the patient and informed her to hold her Eliquis  2 days prior to her procedure. She verbalized undersatnding and will call back with any questions comments or concerns.

## 2024-08-14 NOTE — Telephone Encounter (Signed)
 I called the patient to inform him the he may hold her Eliquis  2 days prior to her procedure.I was unable to leave a voicemail.

## 2024-08-14 NOTE — Telephone Encounter (Signed)
 Spoke to patient & instructed on [name]'s recommendations below. Patient instructed she can come in any time the week before her procedure (09/10/24) Mon-Fri, 8 AM-5 PM, no appointment needed. Patient verbalized understanding & had no further questions.   Jodi [place], PA-C to Me  Jodi Naomie SAILOR, RN      08/13/24 10:33 PM - Her potassium was normal (4.7) on labs done 07/30/2024.  She did not need a potassium supplement. -Her sodium level was slightly low.  We do not give prescription sodium tablets.  Salty foods have sodium. -I had recommended for patient to drink fluids with electrolytes including Gatorade and Powerade. -I recommend patient have repeat lab work (BMP) the week before her colonoscopy to ensure her electrolytes are okay to proceed with procedure. Cline Honora, PA-C

## 2024-08-14 NOTE — Telephone Encounter (Signed)
HER

## 2024-08-16 NOTE — Progress Notes (Signed)
 Agree with the assessment and plan as outlined by Brigitte Canard, PA-C.

## 2024-08-25 ENCOUNTER — Encounter (HOSPITAL_BASED_OUTPATIENT_CLINIC_OR_DEPARTMENT_OTHER): Attending: General Surgery | Admitting: General Surgery

## 2024-08-25 DIAGNOSIS — L97812 Non-pressure chronic ulcer of other part of right lower leg with fat layer exposed: Secondary | ICD-10-CM | POA: Insufficient documentation

## 2024-09-01 ENCOUNTER — Telehealth: Payer: Self-pay | Admitting: Physician Assistant

## 2024-09-01 NOTE — Telephone Encounter (Signed)
 Left message for pt that Ellouise wanted her to have labs a week prior to her appt so she will need to come in for blood work. Message left on pts mobile voicemail.

## 2024-09-01 NOTE — Telephone Encounter (Signed)
 Inbound call from patient stating that she was seen for a physical at her PCP last Friday and had blood work done. Patient is wanting to know if those blood work are good until her colonoscopy scheduled for January the 14 th or is she needing to have more blood work. Patient is requesting a call to her mobile number. Please advise.

## 2024-09-03 ENCOUNTER — Encounter: Payer: Self-pay | Admitting: Gastroenterology

## 2024-09-03 ENCOUNTER — Other Ambulatory Visit (INDEPENDENT_AMBULATORY_CARE_PROVIDER_SITE_OTHER)

## 2024-09-03 DIAGNOSIS — E876 Hypokalemia: Secondary | ICD-10-CM | POA: Diagnosis not present

## 2024-09-03 LAB — BASIC METABOLIC PANEL WITH GFR
BUN: 10 mg/dL (ref 6–23)
CO2: 29 meq/L (ref 19–32)
Calcium: 9.3 mg/dL (ref 8.4–10.5)
Chloride: 98 meq/L (ref 96–112)
Creatinine, Ser: 0.8 mg/dL (ref 0.40–1.20)
GFR: 67.85 mL/min
Glucose, Bld: 93 mg/dL (ref 70–99)
Potassium: 3.8 meq/L (ref 3.5–5.1)
Sodium: 134 meq/L — ABNORMAL LOW (ref 135–145)

## 2024-09-04 ENCOUNTER — Encounter (HOSPITAL_BASED_OUTPATIENT_CLINIC_OR_DEPARTMENT_OTHER): Attending: General Surgery | Admitting: General Surgery

## 2024-09-04 ENCOUNTER — Ambulatory Visit: Payer: Self-pay | Admitting: Physician Assistant

## 2024-09-04 DIAGNOSIS — L97812 Non-pressure chronic ulcer of other part of right lower leg with fat layer exposed: Secondary | ICD-10-CM | POA: Insufficient documentation

## 2024-09-10 ENCOUNTER — Ambulatory Visit: Admitting: Gastroenterology

## 2024-09-10 ENCOUNTER — Encounter: Payer: Self-pay | Admitting: Gastroenterology

## 2024-09-10 VITALS — BP 175/83 | HR 72 | Temp 97.3°F | Resp 17 | Ht 66.0 in | Wt 158.0 lb

## 2024-09-10 DIAGNOSIS — K5730 Diverticulosis of large intestine without perforation or abscess without bleeding: Secondary | ICD-10-CM

## 2024-09-10 DIAGNOSIS — D120 Benign neoplasm of cecum: Secondary | ICD-10-CM

## 2024-09-10 DIAGNOSIS — K625 Hemorrhage of anus and rectum: Secondary | ICD-10-CM

## 2024-09-10 DIAGNOSIS — D123 Benign neoplasm of transverse colon: Secondary | ICD-10-CM

## 2024-09-10 DIAGNOSIS — Z9889 Other specified postprocedural states: Secondary | ICD-10-CM | POA: Diagnosis not present

## 2024-09-10 MED ORDER — SODIUM CHLORIDE 0.9 % IV SOLN: 500.0000 mL | Freq: Once | INTRAVENOUS | Status: DC

## 2024-09-10 NOTE — Op Note (Signed)
 [facility] Patient Name: Jodi Cline Procedure Date: 09/10/2024 9:17 AM MRN: [id number] Endoscopist: Glendia E. Stacia , MD, [id number] Age: 84 Referring MD:  Date of Birth: 27-Jul-1941 Gender: Female Account #: [id number] Procedure:                Colonoscopy Indications:              Rectal bleeding Medicines:                Monitored Anesthesia Care Procedure:                Pre-Anesthesia Assessment:                           - Prior to the procedure, a History and Physical                            was performed, and patient medications and                            allergies were reviewed. The patient's tolerance of                            previous anesthesia was also reviewed. The risks                            and benefits of the procedure and the sedation                            options and risks were discussed with the patient.                            All questions were answered, and informed consent                            was obtained. Prior Anticoagulants: The patient has                            taken Eliquis  (apixaban ), last dose was 2 days                            prior to procedure. ASA Grade Assessment: II - A                            patient with mild systemic disease. After reviewing                            the risks and benefits, the patient was deemed in                            satisfactory condition to undergo the procedure.                           After obtaining informed consent, the colonoscope  was passed under direct vision. Throughout the                            procedure, the patient's blood pressure, pulse, and                            oxygen saturations were monitored continuously. The                            Olympus Scope [id number] was introduced through the                            anus and advanced to the the cecum, identified by                            appendiceal  orifice and ileocecal valve. The                            colonoscopy was somewhat difficult due to multiple                            diverticula in the colon, restricted mobility of                            the colon and a tortuous colon. The patient                            tolerated the procedure well. The quality of the                            bowel preparation was adequate. The ileocecal                            valve, appendiceal orifice, and rectum were                            photographed. The bowel preparation used was SUPREP                            via split dose instruction. Scope In: 9:37:16 AM Scope Out: 9:59:57 AM Scope Withdrawal Time: 0 hours 14 minutes 48 seconds  Total Procedure Duration: 0 hours 22 minutes 41 seconds  Findings:                 Hemorrhoids were found on perianal exam.                           The digital rectal exam was normal. Pertinent                            negatives include normal sphincter tone and no                            palpable rectal lesions.  An 8 mm polyp was found in the cecum. The polyp was                            sessile. The polyp was removed with a cold snare.                            Resection and retrieval were complete. Estimated                            blood loss was minimal.                           A 10 mm polyp was found in the transverse colon.                            The polyp was sessile. The polyp was removed with a                            cold snare. Resection and retrieval were complete.                            Estimated blood loss was minimal.                           Many large-mouthed and small-mouthed diverticula                            were found in the sigmoid colon, descending colon,                            transverse colon and ascending colon. There was                            narrowing of the colon in association with the                             diverticular opening.                           A 6 mm post polypectomy scar was found in the                            rectum. There was no evidence of the previous polyp.                           The exam was otherwise normal throughout the                            examined colon.                           Non-bleeding internal hemorrhoids were found during  retroflexion and during perianal exam. The                            hemorrhoids were Grade III (internal hemorrhoids                            that prolapse but require manual reduction).                           No additional abnormalities were found on                            retroflexion. Complications:            No immediate complications. Estimated Blood Loss:     Estimated blood loss was minimal. Impression:               - Hemorrhoids found on perianal exam. This is the                            likely source of bleeding.                           - One 8 mm polyp in the cecum, removed with a cold                            snare. Resected and retrieved.                           - One 10 mm polyp in the transverse colon, removed                            with a cold snare. Resected and retrieved.                           - Severe diverticulosis in the sigmoid colon, in                            the descending colon, in the transverse colon and                            in the ascending colon. There was narrowing of the                            colon in association with the diverticular opening.                           - Post-polypectomy scar in the rectum.                           - Non-bleeding internal hemorrhoids. Recommendation:           - Patient has a contact number available for  emergencies. The signs and symptoms of potential                            delayed complications were discussed with the                             patient. Return to normal activities tomorrow.                            Written discharge instructions were provided to the                            patient.                           - Resume previous diet.                           - Resume Eliquis  (apixaban ) at prior dose tomorrow.                           - Await pathology results.                           - No repeat colonoscopy due to age.                           - Recommend high fiber diet/daily fiber supplement                            to reduce risk of diverticular complications. Lee Kalt E. Stacia, MD 09/10/2024 10:08:22 AM This report has been signed electronically.

## 2024-09-10 NOTE — Progress Notes (Signed)
 Pt's states no medical or surgical changes since previsit or office visit.

## 2024-09-10 NOTE — Patient Instructions (Signed)
 Handouts provided on polyps, diverticulosis and hemorrhoids.  Resume previous diet.  Continue present medications. Resume Eliquis  (apixaban ) at prior dose tomorrow.  Await pathology results.  No repeat colonoscopy due to age.  Recommend high fiber diet (see handout) /daily fiber supplement to reduce risk of diverticular complications.   YOU HAD AN ENDOSCOPIC PROCEDURE TODAY AT THE [facility]:   Refer to the procedure report that was given to you for any specific questions about what was found during the examination.  If the procedure report does not answer your questions, please call your gastroenterologist to clarify.  If you requested that your care partner not be given the details of your procedure findings, then the procedure report has been included in a sealed envelope for you to review at your convenience later.  YOU SHOULD EXPECT: Some feelings of bloating in the abdomen. Passage of more gas than usual.  Walking can help get rid of the air that was put into your GI tract during the procedure and reduce the bloating. If you had a lower endoscopy (such as a colonoscopy or flexible sigmoidoscopy) you may notice spotting of blood in your stool or on the toilet paper. If you underwent a bowel prep for your procedure, you may not have a normal bowel movement for a few days.  Please Note:  You might notice some irritation and congestion in your nose or some drainage.  This is from the oxygen used during your procedure.  There is no need for concern and it should clear up in a day or so.  SYMPTOMS TO REPORT IMMEDIATELY:  Following lower endoscopy (colonoscopy or flexible sigmoidoscopy):  Excessive amounts of blood in the stool  Significant tenderness or worsening of abdominal pains  Swelling of the abdomen that is new, acute  Fever of 100F or higher  For urgent or emergent issues, a gastroenterologist can be reached at any hour by calling [phone]. Do not use MyChart  messaging for urgent concerns.    DIET:  We do recommend a small meal at first, but then you may proceed to your regular diet.  Drink plenty of fluids but you should avoid alcoholic beverages for 24 hours.  ACTIVITY:  You should plan to take it easy for the rest of today and you should NOT DRIVE or use heavy machinery until tomorrow (because of the sedation medicines used during the test).    FOLLOW UP: Our staff will call the number listed on your records the next business day following your procedure.  We will call around 7:15- 8:00 am to check on you and address any questions or concerns that you may have regarding the information given to you following your procedure. If we do not reach you, we will leave a message.     If any biopsies were taken you will be contacted by phone or by letter within the next 1-3 weeks.  Please call us  at [phone] if you have not heard about the biopsies in 3 weeks.    SIGNATURES/CONFIDENTIALITY: You and/or your care partner have signed paperwork which will be entered into your electronic medical record.  These signatures attest to the fact that that the information above on your After Visit Summary has been reviewed and is understood.  Full responsibility of the confidentiality of this discharge information lies with you and/or your care-partner.

## 2024-09-10 NOTE — Progress Notes (Signed)
 A/o x 3, VSS, good SR's, pleased with anesthesia, report to RN

## 2024-09-10 NOTE — Progress Notes (Signed)
 Called to room to assist during endoscopic procedure.  Patient ID and intended procedure confirmed with present staff. Received instructions for my participation in the procedure from the performing physician.

## 2024-09-10 NOTE — Progress Notes (Signed)
 [facility] History and Physical   Primary Care Physician:  [name], DO   Reason for Procedure:   Rectal bleeding  Plan:    Colonoscopy   HPI: Jodi Cline is a 84 y.o. female undergoing colonoscopy to evaluate rectal bleeding.  She has a history of IBS-C, polyps and hemorrhoids.  She takes Eliquis  for a-fib, last dose 1/12.  Her last colonoscopy was in April 2021, during which a large polyp was removed from the rectum.  3-year repeat colonoscopy was recommended, however she has not had a colonoscopy since then.   11/2019 colonoscopy by Dr. Luis: Large rectal tubulovillous adenoma that was benign, resected in toto.  Extensive pan diverticulosis.  Cecal AVMs which have been quiescent.  No history of anemia or GI bleed/blood loss.  3-year repeat colonoscopy was recommended  The patient was provided an opportunity to ask questions and all were answered. The patient agreed with the plan   Past Medical History:  Diagnosis Date   Anemia    Anxiety    Arthritis    BCC (basal cell carcinoma) 04/18/2005   Right mid thigh (CX35FU)   BCC (basal cell carcinoma) 04/07/2009   right shoulder blade (Cx3)   Depression    GERD (gastroesophageal reflux disease)    Hyperlipidemia    Hypertension    Insomnia    Left bundle branch block (LBBB) 08/2019   Nodular basal cell carcinoma (BCC) 08/05/2013   right shoulder blade (EXC)   PAD (peripheral artery disease)    Paroxysmal atrial fibrillation (HCC)    Pneumonia    as a child   SCC (squamous cell carcinoma) 02/19/2001   left arm (CX35FU)   SCC (squamous cell carcinoma) 04/23/2013   left shin (txpbx)   SCC (squamous cell carcinoma) 08/05/2013   left shin (txpbx)   SCC (squamous cell carcinoma) 05/07/2014   Left shin (txpbx)   SCC (squamous cell carcinoma) 08/04/2014   left shin (MOHS)   Superficial basal cell carcinoma (BCC) 07/13/2015   right thigh (tx p bx)    Past Surgical History:  Procedure Laterality Date    CESAREAN SECTION     LUMBAR LAMINECTOMY/DECOMPRESSION MICRODISCECTOMY N/A 11/11/2020   Procedure: Mircolumbar revision decompression Lumbar four-five bilaterally, excision of synovial cyst;  Surgeon: Duwayne Purchase, MD;  Location: [facility] OR;  Service: Orthopedics;  Laterality: N/A;   THORACIC SPINE SURGERY  1999   Dr. Duwayne   TUBAL LIGATION      Prior to Admission medications  Medication Sig Start Date End Date Taking? Authorizing Provider  acetaminophen  (TYLENOL ) 500 MG tablet 1 tablet as needed   Yes [provider]  cholecalciferol (VITAMIN D3) 25 MCG (1000 UNIT) tablet Take 1,000 Units by mouth daily.   Yes [provider]  Cyanocobalamin (VITAMIN B 12 PO) Take 1 tablet by mouth daily.   Yes [provider]  diltiazem  (CARDIZEM  LA) 240 MG 24 hr tablet Take 240 mg by mouth daily.   Yes [provider]  rosuvastatin  (CRESTOR ) 20 MG tablet Take 20 mg by mouth daily.   Yes [provider]  valsartan -hydrochlorothiazide  (DIOVAN -HCT) 320-12.5 MG tablet Take 1 tablet by mouth daily. 03/13/23  Yes [provider]  Vibegron  (GEMTESA ) 75 MG TABS Take 1 tablet (75 mg total) by mouth daily. 02/19/24  Yes Guadlupe Dull T, MD  calcium  carbonate (TUMS - DOSED IN MG ELEMENTAL CALCIUM ) 500 MG chewable tablet Chew 500 mg by mouth daily as needed for indigestion. 07/09/17   [provider]  ELIQUIS  5 MG TABS tablet TAKE 1 TABLET BY MOUTH TWICE A DAY 03/10/24   Lonni Slain, MD  estradiol  (ESTRACE ) 0.1 MG/GM vaginal cream Place 0.5 g vaginally 2 (two) times a week. Place 0.5g nightly for two weeks then twice a week after 10/04/23   Guadlupe Dull T, MD  famotidine  (PEPCID ) 20 MG tablet Take 20 mg by mouth daily as needed for heartburn or indigestion.    [provider]  polyethylene glycol (MIRALAX  / GLYCOLAX ) 17 g packet Take 17 g by mouth daily as needed for mild constipation. 02/04/21   Rosario Leatrice FERNS, MD  Probiotic Product (PROBIOTIC-10  PO) Take 1 tablet by mouth daily.    [provider]  zolpidem  (AMBIEN ) 10 MG tablet Take 10 mg by mouth at bedtime. 06/25/17   [provider]    Current Outpatient Medications  Medication Sig Dispense Refill   acetaminophen  (TYLENOL ) 500 MG tablet 1 tablet as needed     cholecalciferol (VITAMIN D3) 25 MCG (1000 UNIT) tablet Take 1,000 Units by mouth daily.     Cyanocobalamin (VITAMIN B 12 PO) Take 1 tablet by mouth daily.     diltiazem  (CARDIZEM  LA) 240 MG 24 hr tablet Take 240 mg by mouth daily.     rosuvastatin  (CRESTOR ) 20 MG tablet Take 20 mg by mouth daily.     valsartan -hydrochlorothiazide  (DIOVAN -HCT) 320-12.5 MG tablet Take 1 tablet by mouth daily.     Vibegron  (GEMTESA ) 75 MG TABS Take 1 tablet (75 mg total) by mouth daily. 90 tablet 3   calcium  carbonate (TUMS - DOSED IN MG ELEMENTAL CALCIUM ) 500 MG chewable tablet Chew 500 mg by mouth daily as needed for indigestion.     ELIQUIS  5 MG TABS tablet TAKE 1 TABLET BY MOUTH TWICE A DAY 180 tablet 1   estradiol  (ESTRACE ) 0.1 MG/GM vaginal cream Place 0.5 g vaginally 2 (two) times a week. Place 0.5g nightly for two weeks then twice a week after 30 g 3   famotidine  (PEPCID ) 20 MG tablet Take 20 mg by mouth daily as needed for heartburn or indigestion.     polyethylene glycol (MIRALAX  / GLYCOLAX ) 17 g packet Take 17 g by mouth daily as needed for mild constipation. 30 packet 1   Probiotic Product (PROBIOTIC-10 PO) Take 1 tablet by mouth daily.     zolpidem  (AMBIEN ) 10 MG tablet Take 10 mg by mouth at bedtime.     Current Facility-Administered Medications  Medication Dose Route Frequency Provider Last Rate Last Admin   0.9 %  sodium chloride  infusion  500 mL Intravenous Once Stacia Glendia BRAVO, MD        Allergies as of 09/10/2024 - Review Complete 09/10/2024  Allergen Reaction Noted   Tramadol Swelling 10/21/2019   Benazepril Swelling 08/08/2017   Atorvastatin Other (See Comments) 01/05/2021   Cephalexin Swelling  03/26/2014   Penicillins Swelling 12/10/2019    Family History  Problem Relation Age of Onset   Cancer Mother    Atrial fibrillation Brother    Atrial fibrillation Brother    Atrial fibrillation Brother    Bladder Cancer Neg Hx    Uterine cancer Neg Hx    Renal cancer Neg Hx     Social History   Socioeconomic History   Marital status: Married    Spouse name: Not on file   Number of children: 2   Years of education: Not on file   Highest education level: Not on file  Occupational History   Not  on file  Tobacco Use   Smoking status: Former    Current packs/day: 0.00    Average packs/day: 1 pack/day for 21.0 years (21.0 ttl pk-yrs)    Types: Cigarettes    Start date: 36    Quit date: 11    Years since quitting: 50.0   Smokeless tobacco: Never   Tobacco comments:    Quit at age 55 years  Vaping Use   Vaping status: Never Used  Substance and Sexual Activity   Alcohol use: Yes    Alcohol/week: 14.0 standard drinks of alcohol    Types: 14 Glasses of wine per week    Comment: 2 glasses of wine a day   Drug use: Never   Sexual activity: Never    Partners: Male    Birth control/protection: Post-menopausal  Other Topics Concern   Not on file  Social History Narrative   Not on file   Social Drivers of Health   Tobacco Use: Medium Risk (09/10/2024)   Patient History    Smoking Tobacco Use: Former    Smokeless Tobacco Use: Never    Passive Exposure: Not on Actuary Strain: Not on file  Food Insecurity: Not on file  Transportation Needs: Not on file  Physical Activity: Not on file  Stress: Not on file  Social Connections: Not on file  Intimate Partner Violence: Not on file  Depression (EYV7-0): Not on file  Alcohol Screen: Not on file  Housing: Not on file  Utilities: Not on file  Health Literacy: Not on file    Review of Systems:  All other review of systems negative except as mentioned in the HPI.  Physical Exam: Vital signs BP (!)  166/76   Pulse 79   Temp (!) 97.3 F (36.3 C)   Ht 5' 6 (1.676 m)   Wt 158 lb (71.7 kg)   SpO2 96%   BMI 25.50 kg/m   General:   Alert,  Well-developed, well-nourished, pleasant and cooperative in NAD Airway:  Mallampati 2 Lungs:  Clear throughout to auscultation.   Heart:  Regular rate and rhythm; no murmurs, clicks, rubs,  or gallops. Abdomen:  Soft, nontender and nondistended. Normal bowel sounds.   Neuro/Psych:  Normal mood and affect. A and O x 3   Kaeden Depaz E. Stacia, MD [facility]

## 2024-09-11 ENCOUNTER — Telehealth: Payer: Self-pay | Admitting: *Deleted

## 2024-09-11 NOTE — Telephone Encounter (Signed)
" °  Follow up Call-     09/10/2024    8:29 AM  Call back number  Post procedure Call Back phone  # [phone]  Permission to leave phone message Yes     Patient questions:  Do you have a fever, pain , or abdominal swelling? No. Pain Score  0 *  Have you tolerated food without any problems? Yes.    Have you been able to return to your normal activities? Yes.    Do you have any questions about your discharge instructions: Diet   No. Medications  No. Follow up visit  No.  Do you have questions or concerns about your Care? No.  Actions: * If pain score is 4 or above: No action needed, pain <4.   "

## 2024-09-12 ENCOUNTER — Encounter (HOSPITAL_BASED_OUTPATIENT_CLINIC_OR_DEPARTMENT_OTHER): Admitting: General Surgery

## 2024-09-12 DIAGNOSIS — L97812 Non-pressure chronic ulcer of other part of right lower leg with fat layer exposed: Secondary | ICD-10-CM | POA: Diagnosis not present

## 2024-09-12 LAB — SURGICAL PATHOLOGY

## 2024-09-16 ENCOUNTER — Ambulatory Visit: Payer: Self-pay | Admitting: Gastroenterology

## 2024-09-16 NOTE — Progress Notes (Signed)
 Jodi Cline,  The two polyps which I removed during your recent procedure were proven to be completely benign but are considered pre-cancerous polyps that MAY have grown into cancer if they had not been removed.  Studies shows that at least 20% of women over age 85 and 30% of men over age 43 have pre-cancerous polyps.  Based on current nationally recognized surveillance guidelines, it would be recommended that you have a repeat colonoscopy in 7 years.   However, because colon cancer screening after age 39 is done on a case-by-case basis, taking into account the patient's risk factors for colon cancer, as well as comorbidities and life expectancy, I would recommend against any further colon cancer screening.
